# Patient Record
Sex: Male | Born: 1975 | Race: Black or African American | Hispanic: No | Marital: Single | Smoking: Current every day smoker
Health system: Southern US, Community
[De-identification: ages and names within clinical notes are randomized; demographics above are authoritative.]

## PROBLEM LIST (undated history)

## (undated) DIAGNOSIS — R011 Cardiac murmur, unspecified: Secondary | ICD-10-CM

## (undated) DIAGNOSIS — G4733 Obstructive sleep apnea (adult) (pediatric): Secondary | ICD-10-CM

## (undated) DIAGNOSIS — K5780 Diverticulitis of intestine, part unspecified, with perforation and abscess without bleeding: Secondary | ICD-10-CM

## (undated) DIAGNOSIS — M6282 Rhabdomyolysis: Secondary | ICD-10-CM

## (undated) DIAGNOSIS — Z9989 Dependence on other enabling machines and devices: Secondary | ICD-10-CM

## (undated) DIAGNOSIS — I10 Essential (primary) hypertension: Secondary | ICD-10-CM

## (undated) HISTORY — DX: Diverticulitis of intestine, part unspecified, with perforation and abscess without bleeding: K57.80

## (undated) HISTORY — DX: Dependence on other enabling machines and devices: Z99.89

## (undated) HISTORY — DX: Obstructive sleep apnea (adult) (pediatric): G47.33

---

## 1999-02-16 ENCOUNTER — Emergency Department (HOSPITAL_COMMUNITY): Admission: EM | Admit: 1999-02-16 | Discharge: 1999-02-16 | Payer: Self-pay | Admitting: Emergency Medicine

## 1999-04-13 ENCOUNTER — Emergency Department (HOSPITAL_COMMUNITY): Admission: EM | Admit: 1999-04-13 | Discharge: 1999-04-13 | Payer: Self-pay | Admitting: Emergency Medicine

## 1999-05-19 ENCOUNTER — Emergency Department (HOSPITAL_COMMUNITY): Admission: EM | Admit: 1999-05-19 | Discharge: 1999-05-19 | Payer: Self-pay | Admitting: Emergency Medicine

## 1999-09-22 ENCOUNTER — Emergency Department (HOSPITAL_COMMUNITY): Admission: EM | Admit: 1999-09-22 | Discharge: 1999-09-22 | Payer: Self-pay | Admitting: Emergency Medicine

## 1999-11-17 ENCOUNTER — Emergency Department (HOSPITAL_COMMUNITY): Admission: EM | Admit: 1999-11-17 | Discharge: 1999-11-17 | Payer: Self-pay | Admitting: Emergency Medicine

## 2001-12-22 ENCOUNTER — Emergency Department (HOSPITAL_COMMUNITY): Admission: EM | Admit: 2001-12-22 | Discharge: 2001-12-22 | Payer: Self-pay | Admitting: Emergency Medicine

## 2001-12-23 ENCOUNTER — Emergency Department (HOSPITAL_COMMUNITY): Admission: EM | Admit: 2001-12-23 | Discharge: 2001-12-23 | Payer: Self-pay | Admitting: Emergency Medicine

## 2003-11-19 ENCOUNTER — Emergency Department (HOSPITAL_COMMUNITY): Admission: AD | Admit: 2003-11-19 | Discharge: 2003-11-19 | Payer: Self-pay | Admitting: Family Medicine

## 2007-09-29 ENCOUNTER — Emergency Department (HOSPITAL_COMMUNITY): Admission: EM | Admit: 2007-09-29 | Discharge: 2007-09-29 | Payer: Self-pay | Admitting: Emergency Medicine

## 2007-11-06 ENCOUNTER — Ambulatory Visit: Payer: Self-pay | Admitting: Family Medicine

## 2007-11-06 ENCOUNTER — Inpatient Hospital Stay (HOSPITAL_COMMUNITY): Admission: EM | Admit: 2007-11-06 | Discharge: 2007-11-11 | Payer: Self-pay | Admitting: Emergency Medicine

## 2007-11-12 ENCOUNTER — Encounter: Payer: Self-pay | Admitting: Family Medicine

## 2007-11-15 ENCOUNTER — Ambulatory Visit (HOSPITAL_COMMUNITY): Admission: RE | Admit: 2007-11-15 | Discharge: 2007-11-15 | Payer: Self-pay | Admitting: Family Medicine

## 2007-11-18 ENCOUNTER — Ambulatory Visit: Payer: Self-pay | Admitting: Family Medicine

## 2007-11-18 DIAGNOSIS — R011 Cardiac murmur, unspecified: Secondary | ICD-10-CM | POA: Insufficient documentation

## 2007-11-18 DIAGNOSIS — K5730 Diverticulosis of large intestine without perforation or abscess without bleeding: Secondary | ICD-10-CM | POA: Insufficient documentation

## 2008-09-12 IMAGING — CT CT ABDOMEN W/ CM
2 of 5 series · 16 of 46 positions shown, 18 images · IV contrast (READICAT & 100 ML OMNI 300)
Comparison: 11/09/07 and 11/06/07.

CLINICAL DATA: 31 year old; diverticulitis with diverticular abscess.  Follow-up abscess drainage.
 ABDOMEN CT WITH CONTRAST ? 11/15/07:
TECHNIQUE: Multidetector CT imaging of the abdomen was performed following the standard protocol during bolus administration of intravenous contrast. 
 Contrast:  100 cc Omnipaque 300 IV.
TECHNIQUE: Multidetector CT imaging of the pelvis was performed following the standard protocol during bolus administration of intravenous contrast.

[Series 2: routine abdomen · axial · 0.81mm/px · z∈[-422,-27]mm · 13 of 89 slices shown, 15 images]
[im 5/89  soft-tissue]
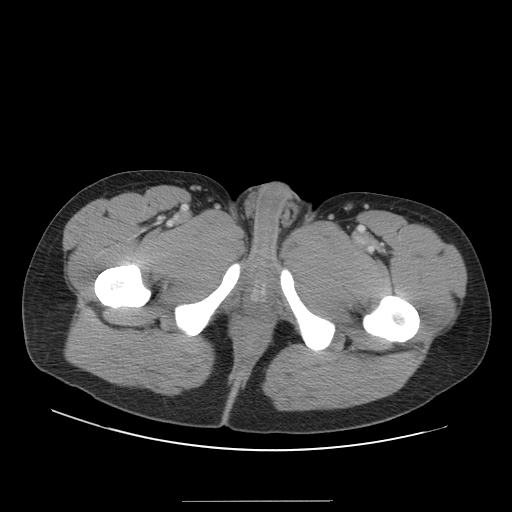
[im 5/89  bone]
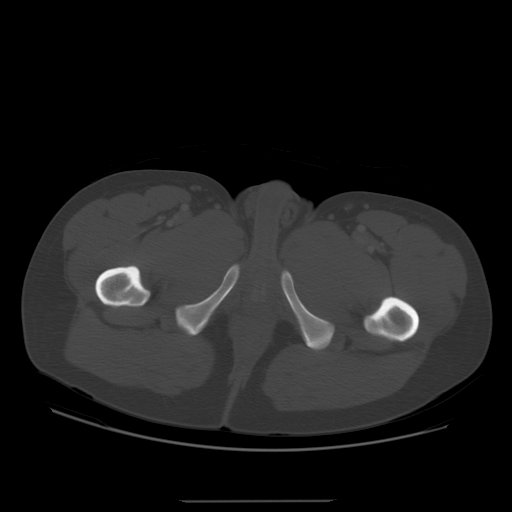
[im 14/89  soft-tissue]
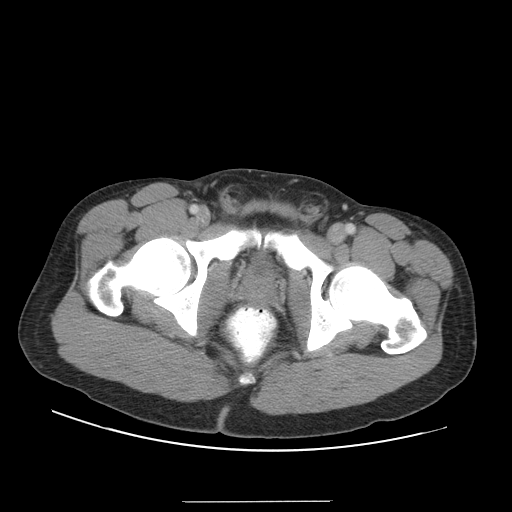
[im 19/89  soft-tissue]
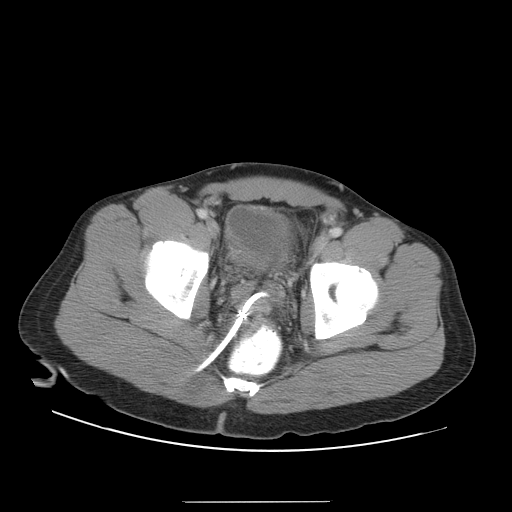
[im 24/89  soft-tissue]
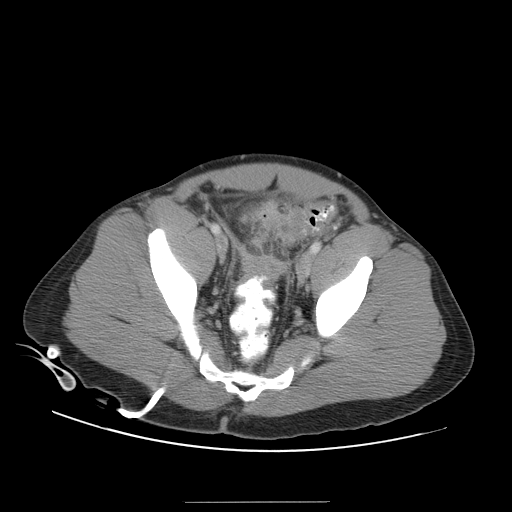
[im 33/89  soft-tissue]
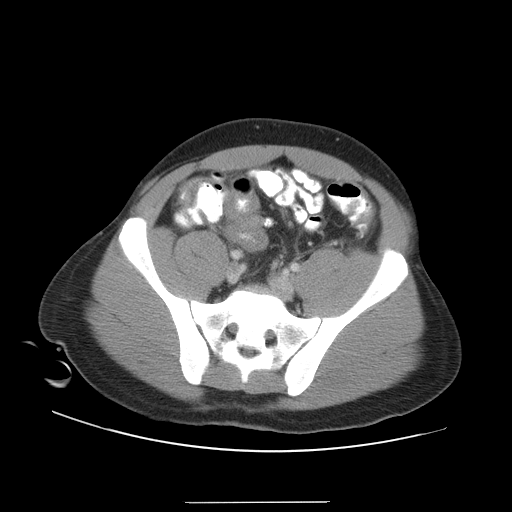
[im 38/89  soft-tissue]
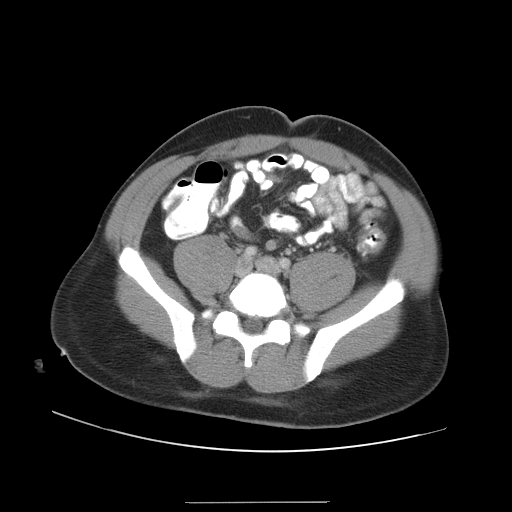
[im 47/89  soft-tissue]
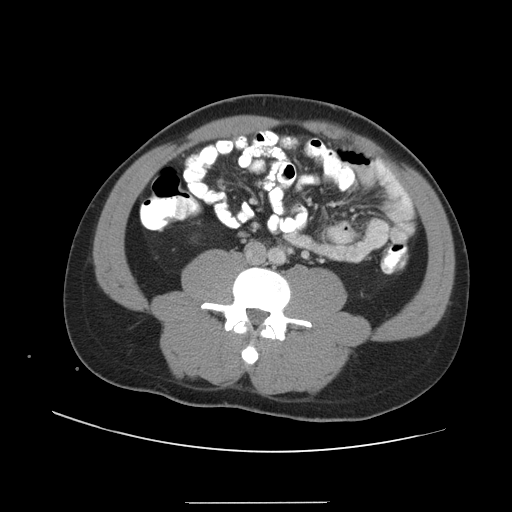
[im 51/89  soft-tissue]
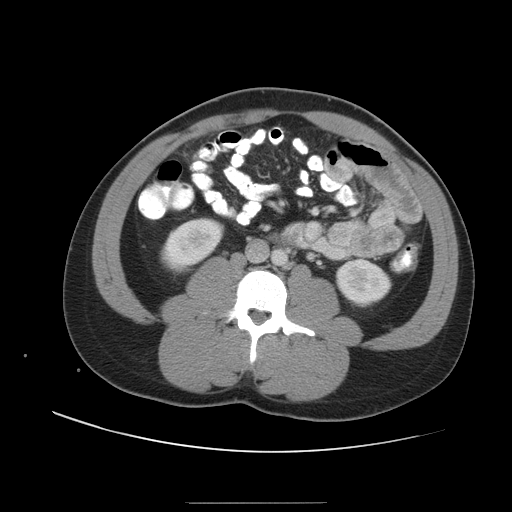
[im 56/89  soft-tissue]
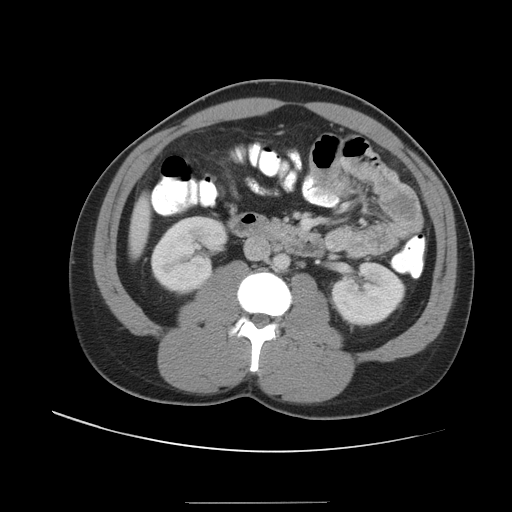
[im 56/89  bone]
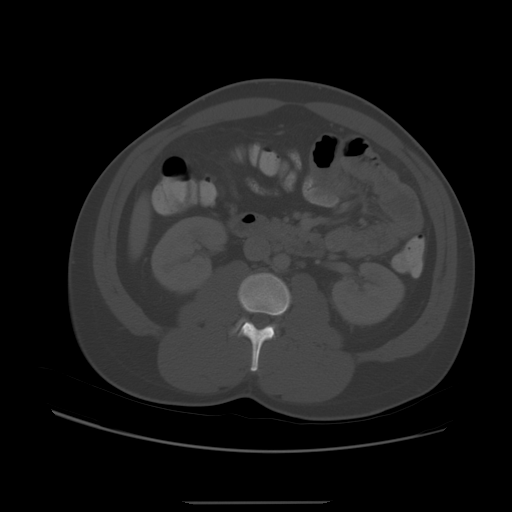
[im 65/89  soft-tissue]
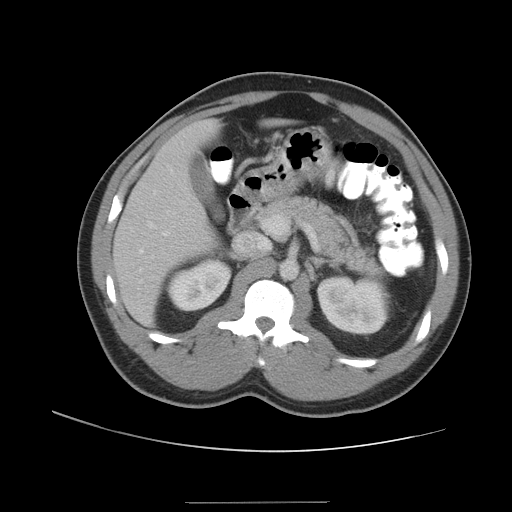
[im 70/89  soft-tissue]
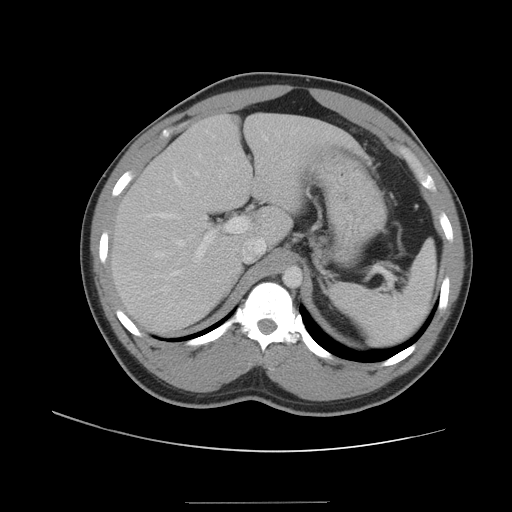
[im 75/89  soft-tissue]
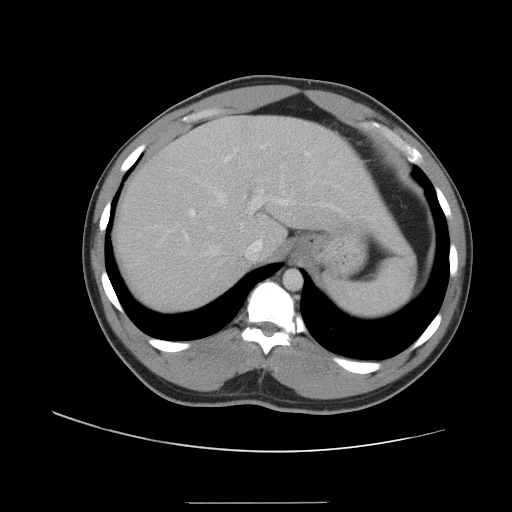
[im 84/89  soft-tissue]
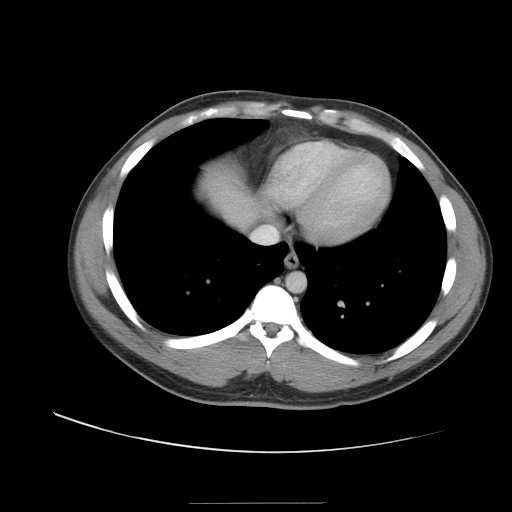

[Series 401: reformatted · coronal · 0.90mm/px · 3 of 166 slices shown]
[im 56/166  soft-tissue]
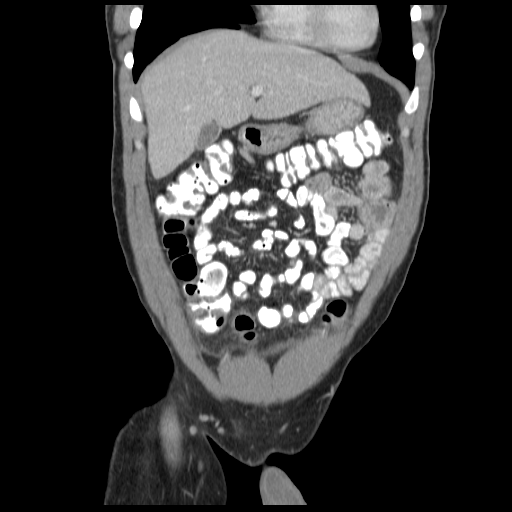
[im 74/166  soft-tissue]
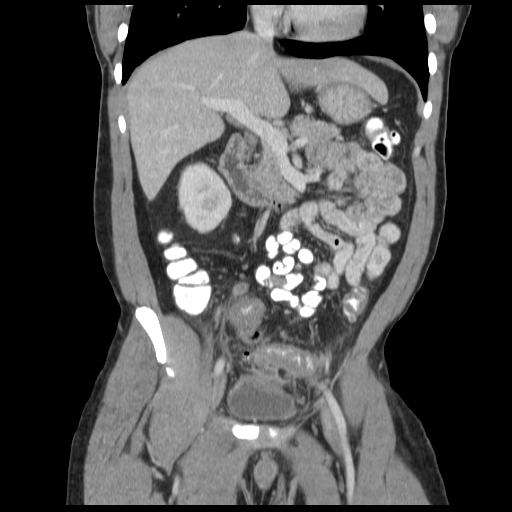
[im 92/166  soft-tissue]
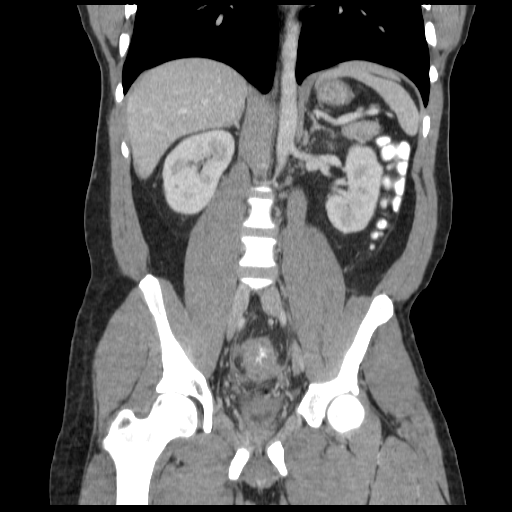

[16 of 46 positions shown; findings below may reference images not displayed]

FINDINGS: The lung bases are clear.  The liver, spleen, pancreas, adrenal glands, and kidneys are unremarkable and stable.  The gallbladder appears normal.  The stomach is not well distended with contrast but no gross abnormalities are seen. The duodenum, small bowel, and colon demonstrate no significant findings.
 There is no free air or free abdominal fluid collections.  Small scattered retroperitoneal lymph nodes are again demonstrated.  There are scattered colonic diverticula.
IMPRESSION: 1.  Unremarkable and stable CT appearance of the abdomen.  
 2.  Small retroperitoneal lymph nodes are stable to slightly smaller in size.
 PELVIS CT WITH CONTRAST ? 11/15/07:
FINDINGS: There is a drainage catheter in the low pelvis between the bladder and rectum where there was a previous abscess.  I don?t see any residual abscess or significant fluid.  The seminal vesicles and prostate gland appear normal. The bladder is unremarkable.  The rectum is unremarkable.  There is persistent diffuse wall thickening involving the sigmoid colon with diverticular disease and mild surrounding inflammatory change.  However, this is much improved compared to the prior examinations.
IMPRESSION: 1.  Drainage catheter in good position with no residual abscess cavity.
 2.  Persistent inflammatory changes involving the sigmoid colon but this is overall improved.
 3.  Small stable inguinal lymph nodes.

## 2009-09-04 ENCOUNTER — Encounter (INDEPENDENT_AMBULATORY_CARE_PROVIDER_SITE_OTHER): Payer: Self-pay | Admitting: *Deleted

## 2009-09-04 DIAGNOSIS — F17200 Nicotine dependence, unspecified, uncomplicated: Secondary | ICD-10-CM | POA: Insufficient documentation

## 2009-11-28 ENCOUNTER — Emergency Department (HOSPITAL_COMMUNITY): Admission: EM | Admit: 2009-11-28 | Discharge: 2009-11-28 | Payer: Self-pay | Admitting: Emergency Medicine

## 2010-06-09 ENCOUNTER — Emergency Department (HOSPITAL_COMMUNITY): Admission: EM | Admit: 2010-06-09 | Discharge: 2010-06-09 | Payer: Self-pay | Admitting: Emergency Medicine

## 2010-06-17 ENCOUNTER — Emergency Department (HOSPITAL_COMMUNITY): Admission: EM | Admit: 2010-06-17 | Discharge: 2010-06-17 | Payer: Self-pay | Admitting: Emergency Medicine

## 2010-06-17 HISTORY — PX: ACHILLES TENDON REPAIR: SUR1153

## 2010-06-18 ENCOUNTER — Ambulatory Visit (HOSPITAL_COMMUNITY): Admission: EM | Admit: 2010-06-18 | Discharge: 2010-06-18 | Payer: Self-pay | Admitting: Emergency Medicine

## 2010-09-16 ENCOUNTER — Ambulatory Visit: Payer: Self-pay | Admitting: Family Medicine

## 2010-09-16 ENCOUNTER — Inpatient Hospital Stay (HOSPITAL_COMMUNITY): Admission: EM | Admit: 2010-09-16 | Discharge: 2010-09-20 | Payer: Self-pay | Admitting: Emergency Medicine

## 2010-12-31 ENCOUNTER — Emergency Department (HOSPITAL_COMMUNITY)
Admission: EM | Admit: 2010-12-31 | Discharge: 2010-12-31 | Payer: Self-pay | Source: Home / Self Care | Admitting: Emergency Medicine

## 2011-03-02 LAB — CBC
HCT: 38.4 % — ABNORMAL LOW (ref 39.0–52.0)
HCT: 39.6 % (ref 39.0–52.0)
HCT: 42.2 % (ref 39.0–52.0)
HCT: 43 % (ref 39.0–52.0)
Hemoglobin: 13.5 g/dL (ref 13.0–17.0)
Hemoglobin: 15.4 g/dL (ref 13.0–17.0)
MCH: 33.6 pg (ref 26.0–34.0)
MCH: 34.4 pg — ABNORMAL HIGH (ref 26.0–34.0)
MCH: 34.5 pg — ABNORMAL HIGH (ref 26.0–34.0)
MCHC: 34.6 g/dL (ref 30.0–36.0)
MCHC: 35.1 g/dL (ref 30.0–36.0)
MCHC: 35.3 g/dL (ref 30.0–36.0)
MCHC: 35.8 g/dL (ref 30.0–36.0)
MCV: 96.2 fL (ref 78.0–100.0)
MCV: 96.6 fL (ref 78.0–100.0)
MCV: 98 fL (ref 78.0–100.0)
Platelets: 217 10*3/uL (ref 150–400)
Platelets: 245 10*3/uL (ref 150–400)
Platelets: 266 10*3/uL (ref 150–400)
Platelets: 291 10*3/uL (ref 150–400)
RBC: 3.96 MIL/uL — ABNORMAL LOW (ref 4.22–5.81)
RDW: 11.7 % (ref 11.5–15.5)
WBC: 10.2 10*3/uL (ref 4.0–10.5)
WBC: 6.1 10*3/uL (ref 4.0–10.5)

## 2011-03-02 LAB — BASIC METABOLIC PANEL
CO2: 28 mEq/L (ref 19–32)
CO2: 28 mEq/L (ref 19–32)
CO2: 29 mEq/L (ref 19–32)
Calcium: 8.7 mg/dL (ref 8.4–10.5)
Calcium: 8.8 mg/dL (ref 8.4–10.5)
Chloride: 100 mEq/L (ref 96–112)
Creatinine, Ser: 1.3 mg/dL (ref 0.4–1.5)
Creatinine, Ser: 1.31 mg/dL (ref 0.4–1.5)
GFR calc Af Amer: >60 mL/min (ref >60)
GFR calc non Af Amer: >60 mL/min (ref >60)
Glucose, Bld: 104 mg/dL — ABNORMAL HIGH (ref 70–99)
Potassium: 3.3 mEq/L — ABNORMAL LOW (ref 3.5–5.1)
Potassium: 3.3 mEq/L — ABNORMAL LOW (ref 3.5–5.1)
Potassium: 3.9 mEq/L (ref 3.5–5.1)
Sodium: 136 mEq/L (ref 135–145)
Sodium: 138 mEq/L (ref 135–145)
Sodium: 140 mEq/L (ref 135–145)

## 2011-03-02 LAB — URINALYSIS, ROUTINE W REFLEX MICROSCOPIC
Glucose, UA: NEGATIVE mg/dL
Hgb urine dipstick: NEGATIVE
Ketones, ur: 40 mg/dL — AB
Protein, ur: 30 mg/dL — AB
Urobilinogen, UA: 4 mg/dL — ABNORMAL HIGH (ref 0.0–1.0)

## 2011-03-02 LAB — DIFFERENTIAL
Basophils Relative: 0 % (ref 0–1)
Eosinophils Absolute: 0.3 10*3/uL (ref 0.0–0.7)
Lymphs Abs: 1.4 10*3/uL (ref 0.7–4.0)
Neutro Abs: 14.5 10*3/uL — ABNORMAL HIGH (ref 1.7–7.7)
Neutrophils Relative %: 81 % — ABNORMAL HIGH (ref 43–77)

## 2011-03-02 LAB — URINE CULTURE: Culture  Setup Time: 201110010045

## 2011-03-02 LAB — COMPREHENSIVE METABOLIC PANEL
ALT: 25 U/L (ref 0–53)
Alkaline Phosphatase: 98 U/L (ref 39–117)
BUN: 13 mg/dL (ref 6–23)
CO2: 24 mEq/L (ref 19–32)
Calcium: 9.4 mg/dL (ref 8.4–10.5)
GFR calc non Af Amer: >60 mL/min (ref >60)
Glucose, Bld: 88 mg/dL (ref 70–99)
Sodium: 137 mEq/L (ref 135–145)
Total Protein: 7.7 g/dL (ref 6.0–8.3)

## 2011-03-02 LAB — LIPASE, BLOOD
Lipase: 21 U/L (ref 11–59)
Lipase: 57 U/L (ref 11–59)

## 2011-03-02 LAB — URINE MICROSCOPIC-ADD ON

## 2011-03-05 LAB — COMPREHENSIVE METABOLIC PANEL
ALT: 41 U/L (ref 0–53)
AST: 35 U/L (ref 0–37)
Albumin: 4.1 g/dL (ref 3.5–5.2)
Alkaline Phosphatase: 80 U/L (ref 39–117)
Chloride: 108 mEq/L (ref 96–112)
Potassium: 4.1 mEq/L (ref 3.5–5.1)
Sodium: 139 mEq/L (ref 135–145)
Total Protein: 7.4 g/dL (ref 6.0–8.3)

## 2011-03-05 LAB — DIFFERENTIAL
Basophils Relative: 1 % (ref 0–1)
Eosinophils Absolute: 0.2 10*3/uL (ref 0.0–0.7)
Eosinophils Relative: 3 % (ref 0–5)
Monocytes Absolute: 0.5 10*3/uL (ref 0.1–1.0)
Monocytes Relative: 9 % (ref 3–12)

## 2011-03-05 LAB — CBC
Platelets: 195 10*3/uL (ref 150–400)
RBC: 4.39 MIL/uL (ref 4.22–5.81)
RDW: 12.8 % (ref 11.5–15.5)
WBC: 6.2 10*3/uL (ref 4.0–10.5)

## 2011-05-02 NOTE — Discharge Summary (Signed)
NAME:  Shane Guzman, Shane Guzman NO.:  [id number]   MEDICAL RECORD NO.:  [id number]          PATIENT TYPE:  OUT   LOCATION:  CATS                         FACILITY:  [facility]   PHYSICIAN:  Lauro Franklin, MDDATE OF BIRTH:  [birth year]   DATE OF ADMISSION:  DATE OF DISCHARGE:  11/11/2007                               DISCHARGE SUMMARY   ATTENDING PHYSICIAN:  Dr. Denny Levy   PRIMARY CARE Nastasha Reising:  The patient is to establish care at [facility]  family practice.   CONSULTATIONS:  Dr. Violeta Gelinas of general surgery.   PROCEDURES PERFORMED:  A CT-guided drain placement of pelvic abscess  collection.   REASON FOR ADMISSION:  Abdominal pain.   DISCHARGE DIAGNOSIS:  Diverticulitis with abscess formation.   MEDICATIONS ON DISCHARGE:  1. Percocet 5/325 mg 1-2 tablets p.o. every 4 hours as needed for      pain.  2. Ibuprofen over-the-counter 2-3 tablets p.o. every 8 hours as needed      for pain.  3. Ciprofloxacin 500 mg one tablet p.o. twice per day x10 days.  4. Flagyl 500 mg one tablet p.o. 3 times per day x10 days.   HOSPITAL COURSE:  The patient is a 35 year old otherwise healthy male  who presented with a 2-day history of abdominal pain.   1. Diverticulitis:  Upon admission and evaluation with CT scan of the      abdomen and pelvis, the patient was found to have a contained      perforated sigmoid diverticulitis with air imposed between the      inferior sigmoid and the dome of the urinary bladder, but no free      air.  Given the patient's initial CT scan findings and after      discussion with surgery, medical management was initiated.  The      patient was initially started on Cipro and Flagyl in transition to      Zosyn.  He was placed on bowel rest and received IV fluids.  He      also received pain medications to control his pain.  On November 09, 2007, the patient had a repeat CT scan which showed two areas      of discrete abscess formation, one just  below the dome of the      bladder, and the other in the pelvic cul-de-sac.  Therefore, on      November 10, 2007, the patient underwent CT-guided drain placement      of the pelvic abscess collection.  He had a transgluteal pelvic      drainage catheter placed.  The second abscess was not targeted.      The patient is to have a repeat CT scan on Friday, November 15, 2007, with both IV and p.o. contrast.  These report results should      be sent to Dr. Janee Morn of surgery.  The patient is also to follow      up with Dr. Janee Morn of surgery in one week.   The patient's condition  at the time of discharge:  Stable.  The patient  was complaining of a little pain and tolerating p.o. well.   1. Macrocytic anemia.   LABS ON DISCHARGE:  Abscess culture grew out streptococcus group C.  Sodium 141, potassium 3.4, glucose 99, BUN 5, creatinine 1.13, calcium  8.6.  White blood cell count 11.1, hemoglobin 12.3, MCV 102.1, platelets  260,000.   DISPOSITION:  The patient is discharged to home.   FOLLOWUP:  1. The patient is to follow up with Dr. Janee Morn of surgery in one      week.  The patient is to call for an appointment.  2. The patient is to follow up with Dr. Altamese Cabal of [facility]      family practice on Monday, November 18, 2007, at 9:45 a.m.  3. The patient is to go to [facility] radiology on Friday, November 15, 2007 at 1:00 p.m., for a CT scan of the abdomen and pelvis with      and without IV contrast.  The patient has been instructed that he      cannot eat anything four hours prior to procedure, which would be      9:00 a.m., and that he must not drink any of the contrast at 11:00      The patient was also instructed that he must pick up the contrast      and all other materials from radiology himself.   FOLLOWUP ISSUES:  1. Surgery recommends that the patient follow up with gastroenterology      as an outpatient for a colonoscopy in 4-6 weeks after his       diverticulitis resolves.  2. Macrocytic anemia.  3. Progression or resolution of second abdominal abscess.      Lauro Franklin, MD  Electronically Signed     TCB/MEDQ  D:  11/12/2007  T:  11/13/2007  Job:  [id number]

## 2011-05-02 NOTE — Consult Note (Signed)
NAME:  GRIFF, BADLEY NO.:  [id number]   MEDICAL RECORD NO.:  [id number]          PATIENT TYPE:  INP   LOCATION:  6727                         FACILITY:  [facility]   PHYSICIAN:  Gabrielle Dare. Janee Morn, M.D.DATE OF BIRTH:  10/22/1976   DATE OF CONSULTATION:  11/07/2007  DATE OF DISCHARGE:                                 CONSULTATION   CONSULTING PHYSICIAN:  Gabrielle Dare. Janee Morn, M.D.   PRIMARY CARE PHYSICIAN:  Unassigned.  He has been admitted by the family  practice teaching service.   REASON FOR CONSULTATION:  Diverticulitis with microperforation,  contained.   HISTORY OF PRESENT ILLNESS:  Mr. Shane Guzman is a 35 year old otherwise  healthy male patient who does have regular tobacco and alcohol usage.  He was admitted through [facility] ER by the family practice teaching service  after presenting with mid abdominal pain and nausea, vomiting for at  least 2 days.  He was found to have a leukocytosis.  There was some  concern he may have had appendicitis initially due to having some right  lower quadrant abdominal pain.  CT scan was done that actually  demonstrated a distal sigmoid diverticulitis with microperforation which  was contained, surrounding mesenteric edema without any true fluid or  abscess collections.  The patient reports that since admission his pain  has decreased, though he does have some abdominal pain with passage of  flatus.  He carries no confirmed diagnosis of diverticular disease  before admission. He does have a family history of this with his  grandmother.  The patient does report similar but milder symptoms x2  separate occurrences greater than 1 year ago.  He describes this as  being cramp like. Symptoms resolved independently of medical treatment  and, therefore, he did not go find a physician.   REVIEW OF SYSTEMS:  As above.   SOCIAL HISTORY:  Significant for tobacco or alcohol use.  He is  unemployed.   PAST MEDICAL HISTORY:  None.   PAST SURGICAL  HISTORY:  None.   ALLERGIES:  No known drug allergies.   CURRENT MEDICATIONS HERE AT [facility]:  Include Dilaudid IV, Zofran IV,  Protonix IV, Tylenol p.r.n., Zosyn IV,  He also received Cipro and  Flagyl in the ER.   PHYSICAL EXAMINATION:  GENERAL:  Pleasant male patient with multiple  questions, complaining of new abdominal pain which has improved since  admission.  VITAL SIGNS:  Temperature 99.8.  blood pressure 130/82, pulse 92 and  regular, respirations 18.  NEUROLOGIC:  The patient is alert, oriented x3, moving all extremities  x4 without focal deficits.  HEENT:  Head is normocephalic.  Sclerae are slightly icteric but not  injected.  NECK:  supple without any appreciable adenopathy.  CHEST:  Bilateral lung sounds clear to auscultation. Respiratory effort  is not labored.  He is on room air.  CARDIAC:  S1, S2.  No rubs, murmurs, thrills, or gallops.  Pulses  regular.  No JVD.  ABDOMEN:  Bloated but soft.  Bowel sounds are quite  active.  He is mildly diffusely tender without any focal areas. No  fullness  felt.  There is no guarding, no rebounding.  EXTREMITIES:  Symmetrical in appearance without edema, cyanosis or  clubbing.   LABORATORY DATA:  White count today is 15,700.  This is up slightly from  yesterday 13,700, hemoglobin 14.9, platelets 144,000, MCV 101.5.  Sodium  129, potassium 3.8, CO2 27, glucose 120, BUN 13, creatinine 1.35.   Diagnostic CT of the abdomen and pelvis as noted.   IMPRESSION:  Sigmoid diverticulitis with microperforation, contained,  first episode requiring hospitalization.   PLAN:  1. Continue Zosyn.  2. Bowel rest.  3. IV fluids.  4. Add Toradol to maximize pain management.  Continue IV Dilaudid.  5. Followup labs:  Will probably need repeat CT prior to discharge to      clarify if the mesenteric edema and if the microperforation has      devolved into a full collection or abscess.  6. Begin diet when the patient has decreased pain and white  cell count      normalizes.  7. teaching regarding low-residue diet.  Anticipate patient will need      to go home on a low-residue diet for at least 2 weeks after      discharge. After that, he will need to begin a diverticular-      appropriate diet.  I have asked for teaching regarding this as      well.  8. At this time, no surgery is indicated unless during this      hospitalization the patient's condition worsens.  No elective      surgery unless the patient has a second episode of microperforation      requiring hospitalization.  The patient has had two similar      episodes of abdominal pain but has not been confirmed as      diverticular disease.  He will discuss this with Dr. Janee Morn to      see if this warrants proceeding with elective surgery in 4-6 weeks.  9. Recommend patient follow up with gastroenterology as an outpatient      for colonoscopy in 4-6 weeks after current process resolves.      Allison L. Rennis Harding, N.P.      Gabrielle Dare Janee Morn, M.D.  Electronically Signed    ALE/MEDQ  D:  11/07/2007  T:  11/07/2007  Job:  [id number]

## 2011-05-02 NOTE — H&P (Signed)
NAME:  Shane Guzman, Shane Guzman NO.:  [id number]   MEDICAL RECORD NO.:  [id number]          PATIENT TYPE:  INP   LOCATION:  6727                         FACILITY:  [facility]   PHYSICIAN:  Shane Guzman, M.D.    DATE OF BIRTH:  1976-05-28   DATE OF ADMISSION:  11/06/2007  DATE OF DISCHARGE:                              HISTORY & PHYSICAL   REASON FOR ADMISSION:  Abdominal pain.   HISTORY OF PRESENT ILLNESS:  Shane Guzman is a 35 year old, African  American male with no significant past medical history who presented to  the ED with history of abdominal pain x2 days.  The patient states the  pain has been constant, cramp-like in his entire lower abdomen.  The  pain persisted and worsened last night and it was so severe he was  unable to sleep.  He has nausea and has had an episode of emesis that  was nonbloody and nonbilious.  The patient has not tried any  medications.  Movement exacerbates the pain as his coughing.  The  patient has taken very low p.o. intake in the last 2 days secondary to  discomfort and nausea.  He has no other complaints.  The patient had a  similar episode of pain 1 year ago, but did not seek medical attention.  In the ED, there was concern for appendicitis.  A CT of abdomen and  pelvis revealed a perforated sigmoid diverticulitis.  The patient  initially started on Cipro and metronidazole.  In the ED, he spiked a  temperature to 102.  The ER doctor spoke with general surgery who  recommended IV antibiotics and close observation.   PAST MEDICAL HISTORY:  None.   SURGICAL HISTORY:  None.   MEDICATIONS:  None.   ALLERGIES:  No known drug allergies.   FAMILY HISTORY:  He has a grandmother with diabetes and diverticular  disease.  His mother had a stroke.  He has an uncle with some sort of  stomach problem that needed a colostomy.   SOCIAL HISTORY:  Currently unemployed.  He smokes one to one and half  packs per day x15 years.  He drinks about six beers  a week.  He uses  marijuana occasionally.  Denies other illicit drug use.   PHYSICAL EXAMINATION:  VITAL SIGNS:  Temperature initially 97.8, then  102, respirations 18, pulse 102, blood pressure 116/85, O2 saturations  96% on room air.  GENERAL:  The patient is lying in bed with abdominal discomfort.  CARDIOVASCULAR:  Regular rate and rhythm, a 2/6 systolic ejection  murmur.  HEENT:  Head normocephalic, atraumatic.  Pupils equal and reactive to  light and accommodation.  Extraocular movements intact.  No scleral  icterus.  Dry mucous membranes.  LUNGS:  Clear to auscultation bilaterally with normal work of breathing.  ABDOMEN:  Positive bowel sounds, very tender in the lower quadrant.  Positive rebound.  EXTREMITIES:  No clubbing, cyanosis or edema.  NEUROLOGIC:  Cranial nerves 2-12 are grossly intact.   LABORATORY DATA AND X-RAY FINDINGS:  His white count is 13.8, hemoglobin  17.8, hematocrit 52.0,  platelets 155.  UA showed moderate bilirubin, 15  ketones, 100 protein, positive nitrite, trace leukocyte esterase,  hyaline casts, many bacteria.  Sodium 136, potassium 3.9, chloride 97,  bicarb 30, BUN 13, creatinine 1.52, platelets 154.  The remainder of the  CMP normal.   CT of the abdomen and pelvis.  He has a contained perforated sigmoid  diverticulitis with air imposed between the inferior sigmoid and the  dome of the urinary bladder.  No free air.   ASSESSMENT/PLAN:  This is 35 year old, African American male with  abdominal pain x2 days and CT findings consistent with a perforated  diverticulitis.   1. Diverticulitis.  Given the patient's age and presentation, there      was initial concern for appendicitis.  However the pelvic CT      revealed a perforated sigmoid diverticulitis.  Emergency department      physician spoke with surgery who stated medical management would be      appropriate at this point.  The patient was initially started on      Cipro and Flagyl.  We will  change the patient to Zosyn q.6 h. for      least 7 days depending on how he does.  Will continue to monitor      his white blood cell count.  We will give him acetaminophen for      fever.  Will place his diet as ice chips.  Will give him Phenergan      for nausea.  We will give him Dilaudid for pain.  We will have      surgery see the patient in the morning for input.  If his abdominal      abscess worsens or does not respond to intravenous antibiotics, he      may require surgery.  We will obtain blood cultures x2 as well.  2. Acute renal failure.  His creatinine was 1.52 on admission.  This      is likely due to dehydration and poor oral intake.  The patient has      already received a 1 L bolus in the emergency department.  We will      run one-half normal saline at 125 per hour.  3. Pyuria.  This is likely secondary to irritation from the      diverticulitis given the proximity on the dome of the bladder.      Will obtain a urine culture and continue to monitor for signs of      urinary dysfunction.  4. Tobacco abuse.  The patient declines patch.  We will obtain smoking      cessation consult.  5. Prophylaxis.  He will be on Protonix intravenous daily for      gastrointestinal prophylaxis and SCDs for deep venous thrombosis      prophylaxis.      Altamese Cabal, M.D.  Electronically Signed      Shane Guzman, M.D.  Electronically Signed    KS/MEDQ  D:  11/06/2007  T:  11/07/2007  Job:  [id number]

## 2011-06-28 ENCOUNTER — Emergency Department (HOSPITAL_COMMUNITY)
Admission: EM | Admit: 2011-06-28 | Discharge: 2011-06-28 | Disposition: A | Discharge status: Home / Self Care | Payer: Self-pay | Attending: Emergency Medicine | Admitting: Emergency Medicine

## 2011-06-28 DIAGNOSIS — J3489 Other specified disorders of nose and nasal sinuses: Secondary | ICD-10-CM | POA: Insufficient documentation

## 2011-06-28 DIAGNOSIS — L909 Atrophic disorder of skin, unspecified: Secondary | ICD-10-CM | POA: Insufficient documentation

## 2011-06-28 DIAGNOSIS — L919 Hypertrophic disorder of the skin, unspecified: Secondary | ICD-10-CM | POA: Insufficient documentation

## 2011-08-06 ENCOUNTER — Emergency Department (HOSPITAL_COMMUNITY): Payer: Self-pay

## 2011-08-06 ENCOUNTER — Emergency Department (HOSPITAL_COMMUNITY)
Admission: EM | Admit: 2011-08-06 | Discharge: 2011-08-06 | Disposition: A | Discharge status: Home / Self Care | Payer: Self-pay | Attending: Emergency Medicine | Admitting: Emergency Medicine

## 2011-08-06 DIAGNOSIS — M25519 Pain in unspecified shoulder: Secondary | ICD-10-CM | POA: Insufficient documentation

## 2011-08-06 DIAGNOSIS — S2239XA Fracture of one rib, unspecified side, initial encounter for closed fracture: Secondary | ICD-10-CM | POA: Insufficient documentation

## 2011-08-06 DIAGNOSIS — IMO0002 Reserved for concepts with insufficient information to code with codable children: Secondary | ICD-10-CM | POA: Insufficient documentation

## 2011-08-06 DIAGNOSIS — R079 Chest pain, unspecified: Secondary | ICD-10-CM | POA: Insufficient documentation

## 2011-09-26 LAB — CBC
HCT: 36.3 — ABNORMAL LOW
HCT: 37.3 — ABNORMAL LOW
Hemoglobin: 11.8 — ABNORMAL LOW
Hemoglobin: 12.8 — ABNORMAL LOW
Hemoglobin: 14.9
Hemoglobin: 17.8 — ABNORMAL HIGH
MCHC: 33.8
MCHC: 34.1
MCHC: 34.3
MCHC: 34.4
MCHC: 34.7
MCV: 100.4 — ABNORMAL HIGH
MCV: 101.5 — ABNORMAL HIGH
Platelets: 155
Platelets: 204
Platelets: 260
RBC: 3.39 — ABNORMAL LOW
RBC: 3.56 — ABNORMAL LOW
RBC: 3.65 — ABNORMAL LOW
RBC: 4.27
RDW: 12.4
RDW: 12.8
RDW: 12.8
WBC: 11.1 — ABNORMAL HIGH
WBC: 15.7 — ABNORMAL HIGH

## 2011-09-26 LAB — COMPREHENSIVE METABOLIC PANEL
ALT: 13
ALT: 34
AST: 15
Albumin: 2.2 — ABNORMAL LOW
Albumin: 3.9
Alkaline Phosphatase: 55
Calcium: 8.3 — ABNORMAL LOW
Calcium: 9.5
GFR calc Af Amer: >60
GFR calc Af Amer: >60
Glucose, Bld: 146 — ABNORMAL HIGH
Glucose, Bld: 154 — ABNORMAL HIGH
Potassium: 3.3 — ABNORMAL LOW
Sodium: 136
Sodium: 139
Total Protein: 5.5 — ABNORMAL LOW
Total Protein: 8.2

## 2011-09-26 LAB — URINE MICROSCOPIC-ADD ON

## 2011-09-26 LAB — BASIC METABOLIC PANEL
BUN: 5 — ABNORMAL LOW
BUN: 5 — ABNORMAL LOW
CO2: 27
CO2: 28
CO2: 29
Calcium: 8.3 — ABNORMAL LOW
Calcium: 8.5
Chloride: 95 — ABNORMAL LOW
Chloride: 99
Creatinine, Ser: 1.19
Creatinine, Ser: 1.21
Creatinine, Ser: 1.35
GFR calc Af Amer: >60
GFR calc Af Amer: >60
GFR calc non Af Amer: >60
GFR calc non Af Amer: >60
Glucose, Bld: 108 — ABNORMAL HIGH
Glucose, Bld: 98
Potassium: 3.4 — ABNORMAL LOW
Potassium: 3.8
Sodium: 134 — ABNORMAL LOW
Sodium: 134 — ABNORMAL LOW

## 2011-09-26 LAB — DIFFERENTIAL
Basophils Relative: 0
Eosinophils Absolute: 0.1 — ABNORMAL LOW
Eosinophils Relative: 1
Lymphocytes Relative: 6 — ABNORMAL LOW
Lymphs Abs: 0.9
Monocytes Absolute: 1
Monocytes Relative: 10
Monocytes Relative: 7
Neutro Abs: 12.1 — ABNORMAL HIGH
Neutrophils Relative %: 83 — ABNORMAL HIGH

## 2011-09-26 LAB — CULTURE, BLOOD (ROUTINE X 2)
Culture: NO GROWTH
Culture: NO GROWTH

## 2011-09-26 LAB — CULTURE, ROUTINE-ABSCESS

## 2011-09-26 LAB — URINALYSIS, ROUTINE W REFLEX MICROSCOPIC
Glucose, UA: NEGATIVE
Hgb urine dipstick: NEGATIVE
Specific Gravity, Urine: 1.037 — ABNORMAL HIGH

## 2011-09-26 LAB — URINE CULTURE

## 2011-09-26 LAB — HEMOGLOBIN A1C: Hgb A1c MFr Bld: 4.9

## 2011-11-22 ENCOUNTER — Inpatient Hospital Stay (HOSPITAL_COMMUNITY)
Admission: EM | Admit: 2011-11-22 | Discharge: 2011-11-24 | DRG: 392 | Disposition: A | Discharge status: Home / Self Care | Payer: Self-pay | Attending: General Surgery | Admitting: General Surgery

## 2011-11-22 ENCOUNTER — Encounter: Payer: Self-pay | Admitting: *Deleted

## 2011-11-22 ENCOUNTER — Emergency Department (HOSPITAL_COMMUNITY): Payer: Self-pay

## 2011-11-22 DIAGNOSIS — K5732 Diverticulitis of large intestine without perforation or abscess without bleeding: Principal | ICD-10-CM | POA: Diagnosis present

## 2011-11-22 DIAGNOSIS — F17200 Nicotine dependence, unspecified, uncomplicated: Secondary | ICD-10-CM | POA: Diagnosis present

## 2011-11-22 DIAGNOSIS — Z79899 Other long term (current) drug therapy: Secondary | ICD-10-CM

## 2011-11-22 DIAGNOSIS — K5720 Diverticulitis of large intestine with perforation and abscess without bleeding: Secondary | ICD-10-CM | POA: Diagnosis present

## 2011-11-22 HISTORY — DX: Cardiac murmur, unspecified: R01.1

## 2011-11-22 LAB — COMPREHENSIVE METABOLIC PANEL
BUN: 13 mg/dL (ref 6–23)
CO2: 24 mEq/L (ref 19–32)
Chloride: 103 mEq/L (ref 96–112)
Creatinine, Ser: 1.22 mg/dL (ref 0.50–1.35)
GFR calc Af Amer: 87 mL/min — ABNORMAL LOW (ref >90)
GFR calc non Af Amer: 75 mL/min — ABNORMAL LOW (ref >90)
Glucose, Bld: 116 mg/dL — ABNORMAL HIGH (ref 70–99)
Total Bilirubin: 0.7 mg/dL (ref 0.3–1.2)

## 2011-11-22 LAB — URINALYSIS, ROUTINE W REFLEX MICROSCOPIC
Hgb urine dipstick: NEGATIVE
Leukocytes, UA: NEGATIVE
Protein, ur: NEGATIVE mg/dL
Urobilinogen, UA: 1 mg/dL (ref 0.0–1.0)

## 2011-11-22 LAB — DIFFERENTIAL
Lymphocytes Relative: 19 % (ref 12–46)
Monocytes Absolute: 1.4 10*3/uL — ABNORMAL HIGH (ref 0.1–1.0)
Monocytes Relative: 11 % (ref 3–12)
Neutro Abs: 8.5 10*3/uL — ABNORMAL HIGH (ref 1.7–7.7)

## 2011-11-22 LAB — CBC
HCT: 48.3 % (ref 39.0–52.0)
Hemoglobin: 17 g/dL (ref 13.0–17.0)
MCHC: 35.2 g/dL (ref 30.0–36.0)
MCV: 98.2 fL (ref 78.0–100.0)
WBC: 12.3 10*3/uL — ABNORMAL HIGH (ref 4.0–10.5)

## 2011-11-22 MED ORDER — MORPHINE SULFATE 4 MG/ML IJ SOLN
6.0000 mg | Freq: Once | INTRAMUSCULAR | Status: AC
  Administered 2011-11-22: 4 mg via INTRAVENOUS
  Filled 2011-11-22: qty 1

## 2011-11-22 MED ORDER — IOHEXOL 300 MG/ML  SOLN
90.0000 mL | Freq: Once | INTRAMUSCULAR | Status: AC | PRN
  Administered 2011-11-22: 90 mL via INTRAVENOUS

## 2011-11-22 MED ORDER — ENOXAPARIN SODIUM 40 MG/0.4ML ~~LOC~~ SOLN
40.0000 mg | SUBCUTANEOUS | Status: DC
  Administered 2011-11-22 – 2011-11-23 (×2): 40 mg via SUBCUTANEOUS
  Filled 2011-11-22 (×3): qty 0.4

## 2011-11-22 MED ORDER — PANTOPRAZOLE SODIUM 40 MG IV SOLR
40.0000 mg | Freq: Every day | INTRAVENOUS | Status: DC
  Administered 2011-11-22 – 2011-11-23 (×2): 40 mg via INTRAVENOUS
  Filled 2011-11-22 (×3): qty 40

## 2011-11-22 MED ORDER — MORPHINE SULFATE 4 MG/ML IJ SOLN
6.0000 mg | Freq: Once | INTRAMUSCULAR | Status: AC
  Administered 2011-11-22: 6 mg via INTRAVENOUS
  Filled 2011-11-22: qty 2

## 2011-11-22 MED ORDER — ACETAMINOPHEN 325 MG PO TABS: 650.0000 mg | ORAL_TABLET | Freq: Four times a day (QID) | ORAL | Status: DC | PRN

## 2011-11-22 MED ORDER — MORPHINE SULFATE 2 MG/ML IJ SOLN
1.0000 mg | INTRAMUSCULAR | Status: DC | PRN
  Administered 2011-11-22 – 2011-11-23 (×3): 4 mg via INTRAVENOUS
  Administered 2011-11-23 (×2): 2 mg via INTRAVENOUS
  Administered 2011-11-23 – 2011-11-24 (×2): 4 mg via INTRAVENOUS
  Filled 2011-11-22: qty 2
  Filled 2011-11-22: qty 1
  Filled 2011-11-22 (×3): qty 2
  Filled 2011-11-22: qty 1
  Filled 2011-11-22: qty 2

## 2011-11-22 MED ORDER — PIPERACILLIN-TAZOBACTAM 3.375 G IVPB
3.3750 g | Freq: Three times a day (TID) | INTRAVENOUS | Status: DC
  Administered 2011-11-22 – 2011-11-23 (×2): 3.375 g via INTRAVENOUS
  Filled 2011-11-22 (×4): qty 50

## 2011-11-22 MED ORDER — SODIUM CHLORIDE 0.9 % IV SOLN: Freq: Once | INTRAVENOUS | Status: DC

## 2011-11-22 MED ORDER — MORPHINE SULFATE 10 MG/ML IJ SOLN
INTRAMUSCULAR | Status: AC
  Administered 2011-11-22: 10 mg
  Filled 2011-11-22: qty 1

## 2011-11-22 MED ORDER — ONDANSETRON HCL 4 MG/2ML IJ SOLN: 4.0000 mg | Freq: Four times a day (QID) | INTRAMUSCULAR | Status: DC | PRN

## 2011-11-22 MED ORDER — DIPHENHYDRAMINE HCL 12.5 MG/5ML PO ELIX
12.5000 mg | ORAL_SOLUTION | Freq: Four times a day (QID) | ORAL | Status: DC | PRN
  Filled 2011-11-22: qty 10

## 2011-11-22 MED ORDER — PIPERACILLIN-TAZOBACTAM 3.375 G IVPB
3.3750 g | Freq: Once | INTRAVENOUS | Status: AC
  Administered 2011-11-22: 3.375 g via INTRAVENOUS
  Filled 2011-11-22: qty 50

## 2011-11-22 MED ORDER — ONDANSETRON HCL 4 MG/2ML IJ SOLN
4.0000 mg | Freq: Once | INTRAMUSCULAR | Status: AC
  Administered 2011-11-22: 4 mg via INTRAVENOUS
  Filled 2011-11-22: qty 2

## 2011-11-22 MED ORDER — SODIUM CHLORIDE 0.9 % IV SOLN
1.0000 g | INTRAVENOUS | Status: DC
  Administered 2011-11-22 – 2011-11-23 (×2): 1 g via INTRAVENOUS
  Filled 2011-11-22 (×2): qty 1

## 2011-11-22 MED ORDER — ACETAMINOPHEN 650 MG RE SUPP: 650.0000 mg | Freq: Four times a day (QID) | RECTAL | Status: DC | PRN

## 2011-11-22 MED ORDER — MORPHINE SULFATE 4 MG/ML IJ SOLN
4.0000 mg | Freq: Once | INTRAMUSCULAR | Status: AC
  Administered 2011-11-22 (×2): 4 mg via INTRAVENOUS
  Filled 2011-11-22: qty 1

## 2011-11-22 MED ORDER — KCL IN DEXTROSE-NACL 20-5-0.45 MEQ/L-%-% IV SOLN
INTRAVENOUS | Status: DC
  Administered 2011-11-22 – 2011-11-24 (×5): via INTRAVENOUS
  Filled 2011-11-22 (×6): qty 1000

## 2011-11-22 MED ORDER — DIPHENHYDRAMINE HCL 50 MG/ML IJ SOLN: 12.5000 mg | Freq: Four times a day (QID) | INTRAMUSCULAR | Status: DC | PRN

## 2011-11-22 MED ORDER — MORPHINE SULFATE 4 MG/ML IJ SOLN
INTRAMUSCULAR | Status: AC
  Administered 2011-11-22: 4 mg via INTRAVENOUS
  Filled 2011-11-22: qty 1

## 2011-11-22 NOTE — ED Provider Notes (Signed)
Medical screening examination/treatment/procedure(s) were performed by non-physician practitioner and as supervising physician I was immediately available for consultation/collaboration.  Flint Melter, MD 11/22/11 [phone]

## 2011-11-22 NOTE — H&P (Signed)
Third episode of diverticulitis in a young man.  Will try to cool off with IV ABX now with plans for elective colectomy once acute inflammation subsides. Plan D/W patient. Patient examined and I agree with the assessment and plan  Shane Guzman E

## 2011-11-22 NOTE — Progress Notes (Signed)
ANTIBIOTIC CONSULT NOTE - INITIAL  Pharmacy Consult for Zosyn Indication: Empiric abdominal coverage  No Known Allergies  Patient Measurements:     Vital Signs: Temp: 98.3 F (36.8 C) (12/05 0953) Temp src: Oral (12/05 0953) BP: 144/96 mmHg (12/05 1151) Pulse Rate: 86  (12/05 1151) Intake/Output from previous day:   Intake/Output from this shift:    Labs:  [facility] 11/22/11 0903  WBC 12.3*  HGB 17.0  PLT 170  LABCREA --  CREATININE 1.22   CrCl is unknown because there is no height on file for the current visit. No results found for this basename: VANCOTROUGH:2,VANCOPEAK:2,VANCORANDOM:2,GENTTROUGH:2,GENTPEAK:2,GENTRANDOM:2,TOBRATROUGH:2,TOBRAPEAK:2,TOBRARND:2,AMIKACINPEAK:2,AMIKACINTROU:2,AMIKACIN:2, in the last 72 hours   Microbiology: No results found for this or any previous visit (from the past 720 hour(s)).  Medical History: Past Medical History  Diagnosis Date  . Diverticulitis     Medications:   (Not in a hospital admission)  Assessment: 35 y.o. M to start Zosyn for empiric abdominal coverage. SCr 1.2, normalized CrCl~80-90 ml/min.   Plan:  1. Zosyn 3.375g IV every 8 hours 2. Will continue to follow renal function, culture results, LOT, and antibiotic de-escalation plans   Rolley Sims 11/22/2011,12:38 PM

## 2011-11-22 NOTE — ED Provider Notes (Signed)
History     CSN: [id number] Arrival date & time: 11/22/2011  8:20 AM   First MD Initiated Contact with Patient 11/22/11 [phone]      Chief Complaint  Patient presents with  . Abdominal Pain    (Consider location/radiation/quality/duration/timing/severity/associated sxs/prior treatment) Patient is a 35 y.o. male presenting with abdominal pain. The history is provided by the patient.  Abdominal Pain The primary symptoms of the illness include abdominal pain. The primary symptoms of the illness do not include nausea, vomiting, diarrhea or dysuria. The current episode started more than 2 days ago. The onset of the illness was gradual. The problem has been gradually worsening.  The patient has not had a change in bowel habit. Symptoms associated with the illness do not include chills, anorexia, constipation, urgency, hematuria or back pain. Significant associated medical issues include diverticulitis.  Pt states painstarted about a week ago, but was mild, in the left lower abdomen. States pain gradually worsened, and became severe last night. States similar pain in the past, was diagnosed with diverticular abscess and had to have it drained. Deneis fever, nausea, vomiting, urinary symptoms, change in bowel habbits.  Past Medical History  Diagnosis Date  . Diverticulitis     History reviewed. No pertinent past surgical history.  History reviewed. No pertinent family history.  History  Substance Use Topics  . Smoking status: Current Everyday Smoker -- 0.5 packs/day    Types: Cigarettes  . Smokeless tobacco: Not on file  . Alcohol Use: 2.4 oz/week    4 Cans of beer per week      Review of Systems  Constitutional: Negative for chills.  HENT: Negative.   Eyes: Negative.   Respiratory: Negative.   Cardiovascular: Negative.   Gastrointestinal: Positive for abdominal pain. Negative for nausea, vomiting, diarrhea, constipation, blood in stool, anal bleeding and anorexia.  Genitourinary:  Negative for dysuria, urgency and hematuria.  Musculoskeletal: Negative.  Negative for back pain.  Skin: Negative.   Neurological: Negative.   Psychiatric/Behavioral: Negative.     Allergies  Review of patient's allergies indicates no known allergies.  Home Medications   Current Outpatient Rx  Name Route Sig Dispense Refill  . IBUPROFEN 200 MG PO TABS Oral Take 600 mg by mouth every 6 (six) hours as needed. For pain       BP 157/100  Pulse 98  Temp(Src) 98.3 F (36.8 C) (Oral)  Resp 18  SpO2 96%  Physical Exam  Constitutional: He is oriented to person, place, and time. He appears well-developed and well-nourished. No distress.  HENT:  Head: Normocephalic and atraumatic.  Eyes: Pupils are equal, round, and reactive to light.  Neck: Neck supple.  Cardiovascular: Normal rate, regular rhythm and normal heart sounds.   Pulmonary/Chest: Effort normal and breath sounds normal. No respiratory distress.  Abdominal: Soft. Bowel sounds are normal.       Tenderness in RLQ and LLQ, guarding  Musculoskeletal: Normal range of motion.  Neurological: He is alert and oriented to person, place, and time.  Skin: Skin is warm and dry. No erythema.  Psychiatric: He has a normal mood and affect.    ED Course  Procedures (including critical care time)  Labs Reviewed  CBC - Abnormal; Notable for the following:    WBC 12.3 (*)    MCH 34.6 (*)    All other components within normal limits  DIFFERENTIAL - Abnormal; Notable for the following:    Neutro Abs 8.5 (*)    Monocytes Absolute 1.4 (*)  All other components within normal limits  COMPREHENSIVE METABOLIC PANEL  URINALYSIS, ROUTINE W REFLEX MICROSCOPIC   PT with hx of diverticulitis, pain to the LLQ and RLQ on exam, guarding. Pt does have a history of an abscess. Will get CT scan for further evaluation  1:15 PM CT scan positive for a small perforation of colon with acute diverticular disease. Zosyn started. VS stable, he is  hypertensive, but afebrile. Spoke with surgery, will admit/come see in ed  MDM          Lottie Mussel, PA 11/22/11 1316

## 2011-11-22 NOTE — H&P (Signed)
Chief Complaint: Abdominal Pain HPI: Shane Guzman is an 35 y.o. male with hx of prior deverticulitis. Once in 2008 requiring perc drainage of associated abscess, and again in September of 2011 with microperforation, requiring hospitalization but no procedure. He has not had any outpt f/u. He began having some mild LLQ abd pain about a week ago and it progressed last night. He has some associated chills and sweats. He tried to go to work this morning but due to the pain, he had to leave and come to the ER. He reports eating a little bit last night and took a laxative. He's had two BMs, no melena or hematochezia, but no improvement of his pain. His workup in the ER finds elevated WBC and recurrent sigmoid diverticulitis on CT. Surgery consult requested.  Past Medical History:  Past Medical History  Diagnosis Date  . Diverticulitis     Past Surgical History: (L) Achilles tendon rupture surgery 7/11  Family History: History reviewed. No pertinent family history.  Social History:  reports that he has been smoking Cigarettes.  He has been smoking about .5 packs per day. He does not have any smokeless tobacco history on file. He drinks a beer or two 3-4 times a week. No illicit drug use.  Allergies: No Known Allergies  Medications:No regular home medications  Complete 10 system review negative except pertinent positives in HPI  Blood pressure 151/84, pulse 83, temperature 98 F (36.7 C), temperature source Oral, resp. rate 16, SpO2 99.00%. There is no height or weight on file to calculate BMI.  General Appearance:  Alert, cooperative, no distress, nontoxic, appears stated age  Head:  Normocephalic, without obvious abnormality, atraumatic  ENT: Unremarkable  Neck: Supple, symmetrical, trachea midline, no adenopathy, thyroid: not enlarged, symmetric, no tenderness/mass/nodules  Lungs:   Clear to auscultation bilaterally, no w/r/r, respirations unlabored without use of accessory muscles.  Chest  Wall:  No tenderness or deformity  Heart:  Regular rate and rhythm, S1, S2 normal, no murmur, rub or gallop. Carotids 2+ without bruit.  Abdomen:   Mild distention. Tender LLQ with mild guarding, no rebound. Few BS, no masses.  Genitalia:  Normal. No hernias  Rectal:  Deferred.  Extremities: Extremities normal, atraumatic, no cyanosis or edema  Pulses: 2+ and symmetric  Skin: Skin color, texture, turgor normal, no rashes or lesions  Neurologic: Normal affect, no gross deficits.   Results for orders placed during the hospital encounter of 11/22/11 (from the past 48 hour(s))  CBC     Status: Abnormal   Collection Time   11/22/11  9:03 AM      Component Value Range Comment   WBC 12.3 (*) 4.0 - 10.5 (K/uL)    RBC 4.92  4.22 - 5.81 (MIL/uL)    Hemoglobin 17.0  13.0 - 17.0 (g/dL)    HCT 40.9  81.1 - 91.4 (%)    MCV 98.2  78.0 - 100.0 (fL)    MCH 34.6 (*) 26.0 - 34.0 (pg)    MCHC 35.2  30.0 - 36.0 (g/dL)    RDW 78.2  95.6 - 21.3 (%)    Platelets 170  150 - 400 (K/uL)   DIFFERENTIAL     Status: Abnormal   Collection Time   11/22/11  9:03 AM      Component Value Range Comment   Neutrophils Relative 69  43 - 77 (%)    Neutro Abs 8.5 (*) 1.7 - 7.7 (K/uL)    Lymphocytes Relative 19  12 -  46 (%)    Lymphs Abs 2.3  0.7 - 4.0 (K/uL)    Monocytes Relative 11  3 - 12 (%)    Monocytes Absolute 1.4 (*) 0.1 - 1.0 (K/uL)    Eosinophils Relative 1  0 - 5 (%)    Eosinophils Absolute 0.1  0.0 - 0.7 (K/uL)    Basophils Relative 0  0 - 1 (%)    Basophils Absolute 0.0  0.0 - 0.1 (K/uL)   COMPREHENSIVE METABOLIC PANEL     Status: Abnormal   Collection Time   11/22/11  9:03 AM      Component Value Range Comment   Sodium 135  135 - 145 (mEq/L)    Potassium 4.0  3.5 - 5.1 (mEq/L)    Chloride 103  96 - 112 (mEq/L)    CO2 24  19 - 32 (mEq/L)    Glucose, Bld 116 (*) 70 - 99 (mg/dL)    BUN 13  6 - 23 (mg/dL)    Creatinine, Ser 5.78  0.50 - 1.35 (mg/dL)    Calcium 9.2  8.4 - 10.5 (mg/dL)    Total Protein  7.9  6.0 - 8.3 (g/dL)    Albumin 4.0  3.5 - 5.2 (g/dL)    AST 24  0 - 37 (U/L)    ALT 28  0 - 53 (U/L)    Alkaline Phosphatase 93  39 - 117 (U/L)    Total Bilirubin 0.7  0.3 - 1.2 (mg/dL)    GFR calc non Af Amer 75 (*) >90 (mL/min)    GFR calc Af Amer 87 (*) >90 (mL/min)   URINALYSIS, ROUTINE W REFLEX MICROSCOPIC     Status: Normal   Collection Time   11/22/11 11:04 AM      Component Value Range Comment   Color, Urine YELLOW  YELLOW     APPearance CLEAR  CLEAR     Specific Gravity, Urine 1.024  1.005 - 1.030     pH 5.5  5.0 - 8.0     Glucose, UA NEGATIVE  NEGATIVE (mg/dL)    Hgb urine dipstick NEGATIVE  NEGATIVE     Bilirubin Urine NEGATIVE  NEGATIVE     Ketones, ur NEGATIVE  NEGATIVE (mg/dL)    Protein, ur NEGATIVE  NEGATIVE (mg/dL)    Urobilinogen, UA 1.0  0.0 - 1.0 (mg/dL)    Nitrite NEGATIVE  NEGATIVE     Leukocytes, UA NEGATIVE  NEGATIVE  MICROSCOPIC NOT DONE ON URINES WITH NEGATIVE PROTEIN, BLOOD, LEUKOCYTES, NITRITE, OR GLUCOSE <1000 mg/dL.   Ct Abdomen Pelvis W Contrast  11/22/2011  *RADIOLOGY REPORT*  Clinical Data: Abdominal pain history of diverticulitis complicated by abscess  CT ABDOMEN AND PELVIS WITH CONTRAST  Technique:  Multidetector CT imaging of the abdomen and pelvis was performed following the standard protocol during bolus administration of intravenous contrast.  Contrast: 90mL OMNIPAQUE IOHEXOL 300 MG/ML IV SOLN  Comparison: 09/16/2010  Findings: Minimal scattered bibasilar dependent atelectasis. Normal heart size.  No pericardial or pleural effusion.  No hiatal hernia.  Abdomen:  Focal fatty infiltration of the liver along the falciform ligament, left hepatic lobe, image 15 through 18.  Stable appearance.  No other hepatic abnormality or biliary dilatation. Gallbladder, biliary system, pancreas, spleen, adrenal glands, and kidneys are within normal limits for age and demonstrate no acute process.  Under distended stomach, small bowel, and central mesentery demonstrate  no acute process.  Negative for obstruction.  Diffuse colonic diverticulosis noted even involving the right colon.  Pelvis:  Sigmoid wall thickening noted with pericolonic inflammatory stranding / edema.  Diverticular changes present in this area.  Appearance is consistent with acute diverticulitis which is recurrent.  This involves a more proximal segment of the sigmoid colon when compared to 09/16/2010. Small extraluminal air collections suspected along the diverticulitis consistent with a contained diverticular perforation.  No drainable fluid collection or abscess.  Trace pelvic free fluid, image 79.  Urinary bladder unremarkable. No adenopathy, hemorrhage, hematoma, or inguinal abnormality.  No hernia.  Prominent appendix containing stool and eight air but no definite acute appendicitis.  No acute or abnormal osseous finding.  IMPRESSION: Proximal acute sigmoid diverticulitis with an adjacent small contained diverticular perforation.  No drainable fluid collection or an abscess.  Critical Value/emergent results were called by telephone at the time of interpretation on 11/22/2011  at 12:00 p.m.  to  Dr. Lynelle Doctor, who verbally acknowledged these results.  Original Report Authenticated By: Judie Petit. Ruel Favors, M.D.    Assessment: Principal Problem:  *Diverticulitis of colon with perforation No abscess Plan: Admit for IV abx. NPO, bowel rest until pain resolved/better. Discussed possibilities if treatment failure including surgery this admit, which may result in colostomy creation. Hopefully anticipate improvement with conservative treatment but explained importance of f/u for elective resection, as this is his 3rd admission for diverticulitis. He understands and will comply.  Marianna Fuss 11/22/2011, 1:44 PM

## 2011-11-22 NOTE — ED Notes (Signed)
Reports having LLQ pain x 1 week, hx of diverticulitis, denies n/v/d.

## 2011-11-22 NOTE — ED Notes (Signed)
Pt c/o LLQ abd pain x5 days, hx of Diverticulitis, last episode 4 mons ago, denies N/V/D or blood in stool, pain w/urination and BM, last normal BM was Monday am, reports taking laxative approx 2030 last pm w/2 BM's last night and one this am, reports eating chicken noodle soup approx 1900 last pm. Pt describes the pain as constant cramping, 10/10

## 2011-11-23 LAB — CBC
Hemoglobin: 15.3 g/dL (ref 13.0–17.0)
MCH: 34.2 pg — ABNORMAL HIGH (ref 26.0–34.0)
Platelets: 151 10*3/uL (ref 150–400)
RBC: 4.47 MIL/uL (ref 4.22–5.81)
WBC: 11.2 10*3/uL — ABNORMAL HIGH (ref 4.0–10.5)

## 2011-11-23 LAB — BASIC METABOLIC PANEL
CO2: 27 mEq/L (ref 19–32)
Chloride: 98 mEq/L (ref 96–112)
Glucose, Bld: 122 mg/dL — ABNORMAL HIGH (ref 70–99)
Sodium: 132 mEq/L — ABNORMAL LOW (ref 135–145)

## 2011-11-23 MED ORDER — BIOTENE DRY MOUTH MT LIQD
15.0000 mL | Freq: Two times a day (BID) | OROMUCOSAL | Status: DC
  Administered 2011-11-23: 15 mL via OROMUCOSAL

## 2011-11-23 MED ORDER — CHLORHEXIDINE GLUCONATE 0.12 % MT SOLN
15.0000 mL | Freq: Two times a day (BID) | OROMUCOSAL | Status: DC
  Administered 2011-11-23 (×2): 15 mL via OROMUCOSAL
  Filled 2011-11-23 (×2): qty 15

## 2011-11-23 NOTE — Progress Notes (Signed)
Subjective: Feels much better this am.  States pain 5/10 today, was 10/10 yesterday.  No BM overnight.  Denies nausea or vomiting.  Anxious to get out of hospital by tomorrow   Objective: Vital signs in last 24 hours: Temp:  [98 F (36.7 C)-102 F (38.9 C)] 98.6 F (37 C) (12/06 0622) Pulse Rate:  [76-112] 83  (12/06 0622) Resp:  [13-26] 16  (12/06 0622) BP: (123-179)/(76-109) 123/76 mmHg (12/06 0622) SpO2:  [93 %-100 %] 95 % (12/06 0622) Weight:  [180 lb 6.4 oz (81.829 kg)] 180 lb 6.4 oz (81.829 kg) (12/05 1815) Last BM Date: 11/22/11  Intake/Output this shift:    Physical Exam: BP 123/76  Pulse 83  Temp(Src) 98.6 F (37 C) (Axillary)  Resp 16  Ht 5\' 4"  (1.626 m)  Wt 180 lb 6.4 oz (81.829 kg)  BMI 30.97 kg/m2  SpO2 95% Gen:  In bed, NAD Abd: Tender in LLQ.  Mild distention.  BS hypoactive  Labs: CBC  Basename 11/23/11 0715 11/22/11 0903  WBC 11.2* 12.3*  HGB 15.3 17.0  HCT 44.5 48.3  PLT 151 170   BMET  Basename 11/23/11 0715 11/22/11 0903  NA 132* 135  K 3.7 4.0  CL 98 103  CO2 27 24  GLUCOSE 122* 116*  BUN 8 13  CREATININE 1.26 1.22  CALCIUM 8.4 9.2   LFT  Basename 11/22/11 0903  PROT 7.9  ALBUMIN 4.0  AST 24  ALT 28  ALKPHOS 93  BILITOT 0.7  BILIDIR --  IBILI --  LIPASE --    Studies/Results: Ct Abdomen Pelvis W Contrast  11/22/2011  *RADIOLOGY REPORT*  Clinical Data: Abdominal pain history of diverticulitis complicated by abscess  CT ABDOMEN AND PELVIS WITH CONTRAST  Technique:  Multidetector CT imaging of the abdomen and pelvis was performed following the standard protocol during bolus administration of intravenous contrast.  Contrast: 90mL OMNIPAQUE IOHEXOL 300 MG/ML IV SOLN  Comparison: 09/16/2010  Findings: Minimal scattered bibasilar dependent atelectasis. Normal heart size.  No pericardial or pleural effusion.  No hiatal hernia.  Abdomen:  Focal fatty infiltration of the liver along the falciform ligament, left hepatic lobe, image  15 through 18.  Stable appearance.  No other hepatic abnormality or biliary dilatation. Gallbladder, biliary system, pancreas, spleen, adrenal glands, and kidneys are within normal limits for age and demonstrate no acute process.  Under distended stomach, small bowel, and central mesentery demonstrate no acute process.  Negative for obstruction.  Diffuse colonic diverticulosis noted even involving the right colon.  Pelvis:  Sigmoid wall thickening noted with pericolonic inflammatory stranding / edema.  Diverticular changes present in this area.  Appearance is consistent with acute diverticulitis which is recurrent.  This involves a more proximal segment of the sigmoid colon when compared to 09/16/2010. Small extraluminal air collections suspected along the diverticulitis consistent with a contained diverticular perforation.  No drainable fluid collection or abscess.  Trace pelvic free fluid, image 79.  Urinary bladder unremarkable. No adenopathy, hemorrhage, hematoma, or inguinal abnormality.  No hernia.  Prominent appendix containing stool and eight air but no definite acute appendicitis.  No acute or abnormal osseous finding.  IMPRESSION: Proximal acute sigmoid diverticulitis with an adjacent small contained diverticular perforation.  No drainable fluid collection or an abscess.  Critical Value/emergent results were called by telephone at the time of interpretation on 11/22/2011  at 12:00 p.m.  to  Dr. Lynelle Doctor, who verbally acknowledged these results.  Original Report Authenticated By: Judie Petit. TREVOR SHICK, M.D.  Assessment: Principal Problem:  *Diverticulitis of colon with perforation     Plan: Continue bowel rest, will reassess this afternoon, if pain minimal will consider starting clears.  Continue invanz Morphine for pain control Cont IVF at current rate Will need elective colectomy once acute episode subsides for recurrent episodes of diverticulitis.  LOS: 1 day      Shane Guzman 11/23/2011

## 2011-11-23 NOTE — Progress Notes (Signed)
Patient examined and I agree with the assessment and plan  Shane Guzman  

## 2011-11-24 LAB — CBC
HCT: 43.7 % (ref 39.0–52.0)
Hemoglobin: 14.9 g/dL (ref 13.0–17.0)
MCH: 33.9 pg (ref 26.0–34.0)
MCV: 99.3 fL (ref 78.0–100.0)
RBC: 4.4 MIL/uL (ref 4.22–5.81)
WBC: 8.3 10*3/uL (ref 4.0–10.5)

## 2011-11-24 MED ORDER — METRONIDAZOLE 500 MG PO TABS: 500.0000 mg | ORAL_TABLET | Freq: Three times a day (TID) | ORAL | Status: DC

## 2011-11-24 MED ORDER — HYDROCODONE-ACETAMINOPHEN 5-325 MG PO TABS: 1.0000 | ORAL_TABLET | Freq: Four times a day (QID) | ORAL | Status: AC | PRN

## 2011-11-24 MED ORDER — PANTOPRAZOLE SODIUM 40 MG PO TBEC: 40.0000 mg | DELAYED_RELEASE_TABLET | Freq: Every day | ORAL | Status: DC

## 2011-11-24 MED ORDER — CIPROFLOXACIN HCL 500 MG PO TABS: 500.0000 mg | ORAL_TABLET | Freq: Two times a day (BID) | ORAL | Status: DC

## 2011-11-24 MED ORDER — HYDROCODONE-ACETAMINOPHEN 5-325 MG PO TABS
1.0000 | ORAL_TABLET | Freq: Four times a day (QID) | ORAL | Status: DC | PRN
  Administered 2011-11-24: 2 via ORAL
  Filled 2011-11-24: qty 2

## 2011-11-24 MED ORDER — METRONIDAZOLE 500 MG PO TABS
500.0000 mg | ORAL_TABLET | Freq: Three times a day (TID) | ORAL | Status: DC
  Administered 2011-11-24: 500 mg via ORAL
  Filled 2011-11-24 (×6): qty 1

## 2011-11-24 MED ORDER — CIPROFLOXACIN HCL 500 MG PO TABS
500.0000 mg | ORAL_TABLET | Freq: Two times a day (BID) | ORAL | Status: DC
  Administered 2011-11-24: 500 mg via ORAL
  Filled 2011-11-24 (×5): qty 1

## 2011-11-24 MED ORDER — SODIUM CHLORIDE 0.9 % IV SOLN: INTRAVENOUS | Status: DC

## 2011-11-24 NOTE — Discharge Summary (Signed)
Physician Discharge Summary  Patient ID: Shane Guzman MRN: [id number] DOB/AGE: 1976/02/17 35 y.o.  Admit date: 11/22/2011 Discharge date: 11/24/2011  Admission Diagnoses: Diverticulitis Discharge Diagnoses:  Principal Problem:  *Diverticulitis of colon with perforation    Discharged Condition: good  Hospital Course: Pt admitted with #rd bout of diverticulitis since 2008. He had to have perc drain for an abscess once before. This time, his initial CT showed Sigmoid diverticulitis with small microperf, no abscess. He was admitted on 12/5 and started on IV abx. His pain resolved fairly quickly and he was started on po diet after about 24hrs. He tolerated advanced diet and was switched to oral abx. He has tolerated these well and we feel he is stable for DC home.  Consults: none   Discharge Exam: Blood pressure 136/82, pulse 86, temperature 98.9 F (37.2 C), temperature source Oral, resp. rate 16, height 5\' 4"  (1.626 m), weight 180 lb 6.4 oz (81.829 kg), SpO2 96.00%. GI: soft, non-tender; bowel sounds normal; no masses,  no organomegaly  Disposition: Home or Self Care  Discharge Orders    Future Orders Please Complete By Expires   Diet - low sodium heart healthy      Increase activity slowly      Other Restrictions      Comments:   May return to work as of 11/27/11   Call MD for:  severe uncontrolled pain      Call MD for:  temperature >100.4      Call MD for:  persistant nausea and vomiting        Current Discharge Medication List    START taking these medications   Details  HYDROcodone-acetaminophen (NORCO) 5-325 MG per tablet Take 1-2 tablets by mouth every 6 (six) hours as needed for pain. Qty: 30 tablet, Refills: 0      CONTINUE these medications which have CHANGED   Details  ciprofloxacin (CIPRO) 500 MG tablet Take 1 tablet (500 mg total) by mouth 2 (two) times daily. Take 1 tablet by mouth two times a day for 10 days Qty: 20 tablet, Refills: 0    metroNIDAZOLE  (FLAGYL) 500 MG tablet Take 1 tablet (500 mg total) by mouth 3 (three) times daily. For 10 days Qty: 30 tablet, Refills: 0      CONTINUE these medications which have NOT CHANGED   Details  ibuprofen (ADVIL,MOTRIN) 200 MG tablet Take 600 mg by mouth every 6 (six) hours as needed. For pain       STOP taking these medications     oxyCODONE-acetaminophen (PERCOCET) 5-325 MG per tablet        Follow-up Information    Follow up with [facility], MD. Make an appointment in 2 weeks.   Contact information:   [facility] [address] [place] [address] [phone]          Signed: Marianna Fuss 11/24/2011, 11:59 AM   '

## 2011-11-24 NOTE — Progress Notes (Signed)
Discharge instructions/Med Rec Sheet reviewed w/ pt. Pt expressed understanding and copies given w/ prescriptions. Pt ambulated off unit in stable condition per his request. 

## 2011-11-24 NOTE — Progress Notes (Signed)
Agree Glori Machnik E  

## 2011-11-24 NOTE — Progress Notes (Signed)
Subjective: Feels fine this am.  States pain 2-3/10 at worst today.  Tolerating clears.  Having regular BM. Denies nausea or vomiting.  Anxious to get out of hospital today   Objective: Vital signs in last 24 hours: Temp:  [98.4 F (36.9 C)-98.9 F (37.2 C)] 98.9 F (37.2 C) (12/07 0609) Pulse Rate:  [86-93] 86  (12/07 0609) Resp:  [16-20] 16  (12/07 0609) BP: (133-142)/(79-87) 136/82 mmHg (12/07 0609) SpO2:  [96 %-100 %] 96 % (12/07 0609) Last BM Date: 11/22/11  Intake/Output this shift:    Physical Exam: BP 136/82  Pulse 86  Temp(Src) 98.9 F (37.2 C) (Oral)  Resp 16  Ht 5\' 4"  (1.626 m)  Wt 180 lb 6.4 oz (81.829 kg)  BMI 30.97 kg/m2  SpO2 96% Gen:  In bed, NAD Abd: Soft, NT/ND.  BS +  Labs: CBC  Basename 11/24/11 0516 11/23/11 0715  WBC 8.3 11.2*  HGB 14.9 15.3  HCT 43.7 44.5  PLT 166 151   BMET  Basename 11/23/11 0715 11/22/11 0903  NA 132* 135  K 3.7 4.0  CL 98 103  CO2 27 24  GLUCOSE 122* 116*  BUN 8 13  CREATININE 1.26 1.22  CALCIUM 8.4 9.2   LFT  Basename 11/22/11 0903  PROT 7.9  ALBUMIN 4.0  AST 24  ALT 28  ALKPHOS 93  BILITOT 0.7  BILIDIR --  IBILI --  LIPASE --    Studies/Results: Ct Abdomen Pelvis W Contrast  11/22/2011  *RADIOLOGY REPORT*  Clinical Data: Abdominal pain history of diverticulitis complicated by abscess  CT ABDOMEN AND PELVIS WITH CONTRAST  Technique:  Multidetector CT imaging of the abdomen and pelvis was performed following the standard protocol during bolus administration of intravenous contrast.  Contrast: 90mL OMNIPAQUE IOHEXOL 300 MG/ML IV SOLN  Comparison: 09/16/2010  Findings: Minimal scattered bibasilar dependent atelectasis. Normal heart size.  No pericardial or pleural effusion.  No hiatal hernia.  Abdomen:  Focal fatty infiltration of the liver along the falciform ligament, left hepatic lobe, image 15 through 18.  Stable appearance.  No other hepatic abnormality or biliary dilatation. Gallbladder, biliary  system, pancreas, spleen, adrenal glands, and kidneys are within normal limits for age and demonstrate no acute process.  Under distended stomach, small bowel, and central mesentery demonstrate no acute process.  Negative for obstruction.  Diffuse colonic diverticulosis noted even involving the right colon.  Pelvis:  Sigmoid wall thickening noted with pericolonic inflammatory stranding / edema.  Diverticular changes present in this area.  Appearance is consistent with acute diverticulitis which is recurrent.  This involves a more proximal segment of the sigmoid colon when compared to 09/16/2010. Small extraluminal air collections suspected along the diverticulitis consistent with a contained diverticular perforation.  No drainable fluid collection or abscess.  Trace pelvic free fluid, image 79.  Urinary bladder unremarkable. No adenopathy, hemorrhage, hematoma, or inguinal abnormality.  No hernia.  Prominent appendix containing stool and eight air but no definite acute appendicitis.  No acute or abnormal osseous finding.  IMPRESSION: Proximal acute sigmoid diverticulitis with an adjacent small contained diverticular perforation.  No drainable fluid collection or an abscess.  Critical Value/emergent results were called by telephone at the time of interpretation on 11/22/2011  at 12:00 p.m.  to  Dr. Lynelle Doctor, who verbally acknowledged these results.  Original Report Authenticated By: Judie Petit. Ruel Favors, M.D.    Assessment: Principal Problem:  *Diverticulitis of colon with perforation     Plan: Change antibiotics to cipro and flagyl  po Hydrocodone for pain control KVO fluids Advance diet, patient anxious to be d/c this afternoon, will see how he does with advancement of diet Will need elective colectomy once acute episode subsides for recurrent episodes of diverticulitis.  LOS: 2 days     Shane Guzman 11/24/2011

## 2011-11-24 NOTE — Progress Notes (Signed)
   CARE MANAGEMENT NOTE 11/24/2011  Patient:  YOON, BARCA   Account Number:  [id number]  Date Initiated:  11/24/2011  Documentation initiated by:  Carlyle Lipa  Subjective/Objective Assessment:   Diverticulitis     Action/Plan:   Home 12/7 with no HH needs   Anticipated DC Date:  11/24/2011   Anticipated DC Plan:  HOME/SELF CARE      DC Planning Services  CM consult                Status of service:  Completed, signed off Medicare Important Message given?  NA - LOS <3 / Initial given by admissions  Discharge Disposition:  HOME/SELF CARE  Per UR Regulation:  Reviewed for med. necessity/level of care/duration of stay

## 2011-11-24 NOTE — Discharge Summary (Signed)
Agree Shane Guzman E  

## 2011-12-05 ENCOUNTER — Encounter (INDEPENDENT_AMBULATORY_CARE_PROVIDER_SITE_OTHER): Payer: Self-pay | Admitting: General Surgery

## 2011-12-06 ENCOUNTER — Ambulatory Visit (INDEPENDENT_AMBULATORY_CARE_PROVIDER_SITE_OTHER): Payer: Self-pay | Admitting: General Surgery

## 2011-12-06 ENCOUNTER — Encounter (INDEPENDENT_AMBULATORY_CARE_PROVIDER_SITE_OTHER): Payer: Self-pay | Admitting: General Surgery

## 2011-12-06 VITALS — BP 138/96 | HR 104 | Temp 97.4°F | Resp 12 | Ht 65.0 in | Wt 184.2 lb

## 2011-12-06 DIAGNOSIS — K5732 Diverticulitis of large intestine without perforation or abscess without bleeding: Secondary | ICD-10-CM

## 2011-12-06 NOTE — Progress Notes (Signed)
Subjective:     Patient ID: Shane Guzman, male   DOB: 1976/06/04, 35 y.o.   MRN: [id number]  HPI Shane Guzman presents for followup from hospitalization for sigmoid diverticulitis.  this was treated medically. He is completing his oral antibiotic course over the next couple days. His abdominal pain is almost completely resolved. He is moving his bowels regularly. He has had no blood in his stools. All in all he is feeling much better.  Review of Systems     Objective:   Physical Exam  Constitutional: He appears well-developed and well-nourished.  Cardiovascular: Normal rate, regular rhythm and normal heart sounds.   Pulmonary/Chest: Effort normal and breath sounds normal. No respiratory distress. He has no wheezes. He has no rales.  Abdominal: Soft. He exhibits no distension. There is tenderness. There is no rebound and no guarding.       Minimal tenderness left lower quadrant on deep palpation       Assessment:     Resolving sigmoid diverticulitis    Plan:     Complete oral antibiotics. This is the patient's third episode. We discussed the possibility of elective sigmoid colectomy again. This will need to wait until all of the acute inflammation is gone. I will see him back next month.I also encouraged the patient to establish a primary care physician.

## 2012-02-14 ENCOUNTER — Encounter (INDEPENDENT_AMBULATORY_CARE_PROVIDER_SITE_OTHER): Payer: Self-pay | Admitting: General Surgery

## 2012-04-15 ENCOUNTER — Emergency Department (HOSPITAL_COMMUNITY): Payer: Self-pay

## 2012-04-15 ENCOUNTER — Encounter (HOSPITAL_COMMUNITY): Payer: Self-pay | Admitting: *Deleted

## 2012-04-15 ENCOUNTER — Emergency Department (HOSPITAL_COMMUNITY)
Admission: EM | Admit: 2012-04-15 | Discharge: 2012-04-15 | Disposition: A | Discharge status: Home / Self Care | Payer: Self-pay | Attending: Emergency Medicine | Admitting: Emergency Medicine

## 2012-04-15 DIAGNOSIS — S61409A Unspecified open wound of unspecified hand, initial encounter: Secondary | ICD-10-CM | POA: Insufficient documentation

## 2012-04-15 DIAGNOSIS — Z79899 Other long term (current) drug therapy: Secondary | ICD-10-CM | POA: Insufficient documentation

## 2012-04-15 DIAGNOSIS — W540XXA Bitten by dog, initial encounter: Secondary | ICD-10-CM | POA: Insufficient documentation

## 2012-04-15 DIAGNOSIS — F17200 Nicotine dependence, unspecified, uncomplicated: Secondary | ICD-10-CM | POA: Insufficient documentation

## 2012-04-15 MED ORDER — SULFAMETHOXAZOLE-TRIMETHOPRIM 800-160 MG PO TABS: 1.0000 | ORAL_TABLET | Freq: Two times a day (BID) | ORAL | Status: DC

## 2012-04-15 MED ORDER — TETANUS-DIPHTH-ACELL PERTUSSIS 5-2.5-18.5 LF-MCG/0.5 IM SUSP
0.5000 mL | Freq: Once | INTRAMUSCULAR | Status: AC
  Administered 2012-04-15: 0.5 mL via INTRAMUSCULAR
  Filled 2012-04-15: qty 0.5

## 2012-04-15 MED ORDER — CLINDAMYCIN HCL 150 MG PO CAPS: 450.0000 mg | ORAL_CAPSULE | Freq: Three times a day (TID) | ORAL | Status: DC

## 2012-04-15 NOTE — ED Provider Notes (Signed)
History     CSN: [id number]  Arrival date & time 04/15/12  4098   First MD Initiated Contact with Patient 04/15/12 [phone]      Chief Complaint  Patient presents with  . Animal Bite    (Consider location/radiation/quality/duration/timing/severity/associated sxs/prior treatment) HPI Comments: Patient reports that approximately 12 hours ago he was bitten in the left hand by his own dog.  He is presenting with laceration of the palmer surface of left hand.  All of the dogs vaccines are UTD.  Patient reports that after the bite he cleaned the wound with hydrogen peroxide, but did not seek medical treatment until today.  He denies any fever/chills  Denies any drainage from the wound.  Bleeding controlled at this time. He is able to move all of his fingers.  He is unsure of the date of his last tetanus.    The history is provided by the patient.    Past Medical History  Diagnosis Date  . Diverticulitis   . Heart murmur     Past Surgical History  Procedure Date  . Achilles tendon repair 06/2010    left; S/P tendon rupture    History reviewed. No pertinent family history.  History  Substance Use Topics  . Smoking status: Current Everyday Smoker -- 1.0 packs/day for 18 years    Types: Cigarettes  . Smokeless tobacco: Never Used  . Alcohol Use: 3.6 oz/week    6 Cans of beer per week      Review of Systems  Constitutional: Negative for fever and chills.  Respiratory: Negative for shortness of breath.   Gastrointestinal: Negative for nausea and vomiting.  Musculoskeletal: Negative for joint swelling.  Skin: Positive for wound.    Allergies  Review of patient's allergies indicates no known allergies.  Home Medications   Current Outpatient Rx  Name Route Sig Dispense Refill  . ACETAMINOPHEN 500 MG PO TABS Oral Take 1,000 mg by mouth every 6 (six) hours as needed. For pain    . HYPROMELLOSE 2.5 % OP SOLN Both Eyes Place 2 drops into both eyes daily as needed. For dry eyes      . IBUPROFEN 200 MG PO TABS Oral Take 600 mg by mouth every 6 (six) hours as needed. For pain      BP 164/113  Pulse 88  Temp(Src) 98 F (36.7 C) (Oral)  Resp 12  Ht 5\' 4"  (1.626 m)  Wt 185 lb (83.915 kg)  BMI 31.76 kg/m2  SpO2 96%  Physical Exam  Nursing note and vitals reviewed. Constitutional: He appears well-developed and well-nourished. No distress.  HENT:  Head: Normocephalic and atraumatic.  Mouth/Throat: Oropharynx is clear and moist.  Cardiovascular: Normal rate, regular rhythm and normal heart sounds.   Pulmonary/Chest: Effort normal and breath sounds normal.  Musculoskeletal: Normal range of motion.  Neurological: He is alert.  Skin: He is not diaphoretic.     Psychiatric: He has a normal mood and affect.    ED Course  Procedures (including critical care time)  Labs Reviewed - No data to display Dg Hand Complete Left  04/15/2012  *RADIOLOGY REPORT*  Clinical Data: History of abdominal bite.  Laceration on the hand. Injury in areas of first, second, and third metacarpals.  LEFT HAND - COMPLETE 3+ VIEW  Comparison: None.  Findings: There is no evidence of fracture or dislocation.  History given of soft tissue injury.  No opaque foreign body is evident. Alignment is normal.  IMPRESSION: History of soft tissue injury.  No evidence of opaque foreign body. No fracture or dislocation is evident.  Original Report Authenticated By: Crawford Givens, M.D.     No diagnosis found.    MDM  Patient with dog bite 12 hours ago.  Due to the bite occuring in the hand and occuring 12 hours ago the laceration was not sutured.  The laceration was cleaned well and explored.  Negative hand xray.  Patient uninsured and unable to afford Augmentin.  Therefore, patient given Rx for Bactrim DS and Clindamycin.  Tetanus given in the ED.  Return precautions given.        Pascal Lux [place], PA-C 04/15/12 1811

## 2012-04-15 NOTE — ED Notes (Signed)
Pt was trying to get his dog out of it's cage and it bit him in the L hand.  States dog has all vaccinations.  Does not remember last tetanus.

## 2012-04-16 NOTE — ED Provider Notes (Signed)
Medical screening examination/treatment/procedure(s) were performed by non-physician practitioner and as supervising physician I was immediately available for consultation/collaboration.  Rayyan Burley, MD 04/16/12 0742 

## 2012-04-17 ENCOUNTER — Emergency Department (HOSPITAL_COMMUNITY)
Admission: EM | Admit: 2012-04-17 | Discharge: 2012-04-17 | Disposition: A | Discharge status: Home / Self Care | Payer: Self-pay | Attending: Emergency Medicine | Admitting: Emergency Medicine

## 2012-04-17 ENCOUNTER — Encounter (HOSPITAL_COMMUNITY): Payer: Self-pay | Admitting: Family Medicine

## 2012-04-17 DIAGNOSIS — F17200 Nicotine dependence, unspecified, uncomplicated: Secondary | ICD-10-CM | POA: Insufficient documentation

## 2012-04-17 DIAGNOSIS — S61409A Unspecified open wound of unspecified hand, initial encounter: Secondary | ICD-10-CM | POA: Insufficient documentation

## 2012-04-17 DIAGNOSIS — S61459A Open bite of unspecified hand, initial encounter: Secondary | ICD-10-CM

## 2012-04-17 DIAGNOSIS — W540XXA Bitten by dog, initial encounter: Secondary | ICD-10-CM | POA: Insufficient documentation

## 2012-04-17 NOTE — ED Notes (Signed)
Pt seem here Saturday for dog bite to left hand. sts he is here to have rechecked.

## 2012-04-17 NOTE — Discharge Instructions (Signed)
Read the information below.  Continue taking your antibiotics as directed until they are gone. Return to the ER immediately if you develop redness, swelling, pus draining from the wound, or fevers greater than 100.4.  You may return to the ER at any time for worsening condition or any new symptoms that concern you.  Animal Bite An animal bite can result in a scratch on the skin, deep open cut, puncture of the skin, crush injury, or tearing away of the skin or a body part. Dogs are responsible for most animal bites. Children are bitten more often than adults. An animal bite can range from very mild to more serious. A small bite from your house pet is no cause for alarm. However, some animal bites can become infected or injure a bone or other tissue. You must seek medical care if:  The skin is broken and bleeding does not slow down or stop after 15 minutes.   The puncture is deep and difficult to clean (such as a cat bite).   Pain, warmth, redness, or pus develops around the wound.   The bite is from a stray animal or rodent. There may be a risk of rabies infection.   The bite is from a snake, raccoon, skunk, fox, coyote, or bat. There may be a risk of rabies infection.   The person bitten has a chronic illness such as diabetes, liver disease, or cancer, or the person takes medicine that lowers the immune system.   There is concern about the location and severity of the bite.  It is important to clean and protect an animal bite wound right away to prevent infection. Follow these steps:  Clean the wound with plenty of water and soap.   Apply an antibiotic cream.   Apply gentle pressure over the wound with a clean towel or gauze to slow or stop bleeding.   Elevate the affected area above the heart to help stop any bleeding.   Seek medical care. Getting medical care within 8 hours of the animal bite leads to the best possible outcome.  DIAGNOSIS  Your caregiver will most likely:  Take a  detailed history of the animal and the bite injury.   Perform a wound exam.   Take your medical history.  Blood tests or X-rays may be performed. Sometimes, infected bite wounds are cultured and sent to a lab to identify the infectious bacteria.  TREATMENT  Medical treatment will depend on the location and type of animal bite as well as the patient's medical history. Treatment may include:  Wound care, such as cleaning and flushing the wound with saline solution, bandaging, and elevating the affected area.   Antibiotics.   Tetanus immunization.   Rabies immunization.   Leaving the wound open to heal. This is often done with animal bites, due to the high risk of infection. However, in certain cases, wound closure with stitches, wound adhesive, skin adhesive strips, or staples may be used.  Infected bites that are left untreated may require intravenous (IV) antibiotics and surgical treatment in the hospital. HOME CARE INSTRUCTIONS  Follow your caregiver's instructions for wound care.   Take all medicines as directed.   If your caregiver prescribes antibiotics, take them as directed. Finish them even if you start to feel better.   Follow up with your caregiver for further exams or immunizations as directed.  You may need a tetanus shot if:  You cannot remember when you had your last tetanus shot.  You have never had a tetanus shot.   The injury broke your skin.  If you get a tetanus shot, your arm may swell, get red, and feel warm to the touch. This is common and not a problem. If you need a tetanus shot and you choose not to have one, there is a rare chance of getting tetanus. Sickness from tetanus can be serious. SEEK MEDICAL CARE IF:  You notice warmth, redness, soreness, swelling, pus discharge, or a bad smell coming from the wound.   You have a red line on the skin coming from the wound.   You have a fever, chills, or a general ill feeling.   You have nausea or  vomiting.   You have continued or worsening pain.   You have trouble moving the injured part.   You have other questions or concerns.  MAKE SURE YOU:  Understand these instructions.   Will watch your condition.   Will get help right away if you are not doing well or get worse.  Document Released: 08/22/2011 Document Revised: 11/23/2011 Document Reviewed: 08/22/2011 [facility] Patient Information 2012 [place], [place].  If you have no primary doctor, here are some resources that may be helpful:  Medicaid-accepting [facility] Providers:   - [facility]- [address]      [phone]      Mon-Fri 9am-7pm, Sat 9am-1pm   - [facility]- [address] [place]      [phone]   - [facility]- [address], [address] [place]      [phone]   [facility]- [address]      [phone]   - Renaye Rakers- [address] [place], [address]      [phone]      Only accepts [place] patients       after they have her name applied to their card   Self Pay (no insurance) in [place]:   - Sickle Cell Patients: Dr Willey Blade, [facility] Internal [facility]      [address] [place]      [phone]   - [facility] Connect253 [phone]   - Physician Referral Service- [phone]   - [facility]- [address] [place]      [phone]   [facility] [place]- [address]   - [facility]- see information above      (Speak to [employer] if you do not have insurance)   - [facility]- [address] [place]      [phone]   - [facility] [place]- [address]      [phone]   - [facility]- [address]      [phone]   - Dr Julio Sicks-  [address], [place]      [phone]   - [facility]- [address]      [phone]   - [facility]- [address]      [phone]      Also [address]      [phone]   - [facility]- [address]      [phone]      1st and 3rd Saturday every month, 10am-1pm Other agencies that provide inexpensive medical care:    [facility]  [phone]    [facility]  [phone]    [facility]  [phone]    [facility]  [phone]    [facility]  [phone]  General Information: Finding a doctor when you do not have health insurance can be tricky. Although you are not limited by an insurance plan, you are of course limited by her finances and how much but he can pay out of pocket.  What are your options if you don't have health insurance?   1) Find a Librarian, academic and Pay Out of Pocket Although you won't have to find out who is covered by your insurance plan, it is a good idea to ask around and get recommendations. You will then need to call the office and see if the doctor you have chosen will accept you as a new patient and what types of options they offer for patients who are self-pay. Some doctors offer discounts or will set up payment plans for their patients who do not have insurance, but you will need to ask so you aren't surprised when you get to your appointment.  2) Contact Your Local Health Department Not all health departments have doctors that can see patients for sick visits, but many do, so it is worth a call to see if yours does. If you don't know where your local health department is, you can check in your phone book. The CDC also has a tool to help you locate your state's health department, and many state websites also have listings of all of their local health departments.  3) Find a Walk-in Clinic If your illness is not likely to be very severe or complicated, you may want to try a walk in clinic. These are popping up all over the country in pharmacies, drugstores, and shopping centers. They're usually staffed by nurse practitioners or physician assistants that  have been trained to treat common illnesses and complaints. They're usually fairly quick and inexpensive. However, if you have serious medical issues or chronic medical problems, these are probably not your best option  RESOURCE GUIDE  Dental Problems  Patients with Medicaid: [facility] [phone]                                           [phone] [address] [employer] Phone:  [phone]                                                  Phone:  [phone]  If unable to pay or uninsured, contact:  [facility] or [facility]. to become qualified for the adult dental clinic.  Chronic Pain Problems Contact [facility]  [phone] Patients need to be referred by their primary care doctor.  Insufficient Money for Medicine Contact [employer] Way:  call "211" or [facility] [phone].  No Primary Care Doctor Call [facility]  [phone] Other agencies that provide inexpensive medical care    [facility]  [phone]    [facility]  [phone]    [facility]  [facility]  [phone]    Women's Clinic  [phone]    [facility]  [phone]    [facility]  [phone]  Psychological Services [facility]  [phone] [facility]  [phone] [facility]   [phone] (emergency services [phone])  Substance Abuse Resources Alcohol and Drug Services  [phone] [facility] [phone] [facility] [place] [phone] Floydene Flock [phone] Residential & Outpatient Substance Abuse Program  [phone]  Abuse/Neglect [facility] Child Abuse Hotline 941-478-1929 [facility] Child Abuse Hotline [phone] (After Hours)  Emergency Shelter [facility] [phone]  Maternity Homes [facility] [place] [facility] [place] [phone] [facility] [phone]  MRSA Hotline #:    609-015-9351    [facility] Resources  [facility] [place]     United [employer]                          [facility] [address] [place]                       [address]      [address]  Blondell Reveal Phone:  [phone]                                   Phone:  [phone]                 Phone:  [phone]  [facility] Phone:  [phone]  [facility] Child Abuse Hotline [phone] [phone] (After Hours)

## 2012-04-17 NOTE — ED Notes (Signed)
NAD noted at time of d/c home. D/C inst given by Chad, PA-C. Pt verbalized understanding

## 2012-04-17 NOTE — ED Notes (Signed)
Pt here for re-evaluation of left hand wound from dog bite. Pt states he has taken abx as prescribed, no fever or chills, pt reports no drainage from wound. Laceration to left palm of hand open with pink granulation tissue showing. No redness, drainage or bleeding noted.

## 2012-04-17 NOTE — ED Provider Notes (Signed)
History     CSN: [id number]  Arrival date & time 04/17/12  1055   First MD Initiated Contact with Patient 04/17/12 1143      Chief Complaint  Patient presents with  . Hand Pain    (Consider location/radiation/quality/duration/timing/severity/associated sxs/prior treatment) HPI Comments: Patient presents to ED for recheck of left hand.  Seen previously in the emergency department after his dog bit his left hand.  Has been taking the antibiotics as he thought they were prescribed (one TID, one once daily).  Denies fevers, red streaks up his arm, any redness, swelling, discharge from the wound.  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    Patient is a 36 y.o. male presenting with hand pain. The history is provided by the patient.  Hand Pain Pertinent negatives include no arthralgias, chills, fever, myalgias, numbness, rash or weakness.    Past Medical History  Diagnosis Date  . Diverticulitis   . Heart murmur     Past Surgical History  Procedure Date  . Achilles tendon repair 06/2010    left; S/P tendon rupture    History reviewed. No pertinent family history.  History  Substance Use Topics  . Smoking status: Current Everyday Smoker -- 1.0 packs/day for 18 years    Types: Cigarettes  . Smokeless tobacco: Never Used  . Alcohol Use: 3.6 oz/week    6 Cans of beer per week      Review of Systems  Constitutional: Negative for fever and chills.  Musculoskeletal: Negative for myalgias and arthralgias.  Skin: Positive for wound. Negative for rash.  Neurological: Negative for weakness and numbness.    Allergies  Review of patient's allergies indicates no known allergies.  Home Medications   Current Outpatient Rx  Name Route Sig Dispense Refill  . CLINDAMYCIN HCL 150 MG PO CAPS Oral Take 450 mg by mouth 3 (three) times daily. Starting 04/15/12 for 10 days    . SULFAMETHOXAZOLE-TRIMETHOPRIM 800-160 MG PO  TABS Oral Take 1 tablet by mouth 2 (two) times daily. Starting 04/15/12 for 10 days      BP 155/96  Pulse 86  Resp 18  SpO2 97%  Physical Exam  Nursing note and vitals reviewed. Constitutional: He appears well-developed and well-nourished. No distress.  HENT:  Head: Normocephalic and atraumatic.  Neck: Neck supple.  Pulmonary/Chest: Effort normal.  Musculoskeletal:       Left palm with clean healing wound.  No erythema, no edema, no discharge, no warmth.  Radial pulses intact, pt moves all fingers easily.   Neurological: He is alert. He exhibits normal muscle tone.  Skin: He is not diaphoretic.  Psychiatric: He has a normal mood and affect. His behavior is normal. Judgment and thought content normal.    ED Course  Procedures (including critical care time)  Labs Reviewed - No data to display No results found.   1. Dog bite of hand without complication       MDM  Pt presents for follow up after being seen and treated for dog bite to left hand.  Dog was reported UTD on vaccinations.  Pt advised on how to take his antibiotics properly (pt was apparently taking Bactrim once daily).  Despite this, the wound is healing very well.  Neurovascularly intact, no signs of infection.  I have discussed wound care with him and have advised him to continue taking his antibiotics until they are gone.  Discussed return precautions.  Patient verbalizes understanding  and agrees with plan.          Rise Patience, [place] 04/17/12 1325

## 2012-04-20 NOTE — ED Provider Notes (Signed)
Medical screening examination/treatment/procedure(s) were performed by non-physician practitioner and as supervising physician I was immediately available for consultation/collaboration.  Tyera Hansley, MD 04/20/12 0844 

## 2013-06-02 ENCOUNTER — Emergency Department (HOSPITAL_COMMUNITY)
Admission: EM | Admit: 2013-06-02 | Discharge: 2013-06-02 | Disposition: A | Discharge status: Home / Self Care | Payer: Self-pay | Attending: Emergency Medicine | Admitting: Emergency Medicine

## 2013-06-02 ENCOUNTER — Encounter (HOSPITAL_COMMUNITY): Payer: Self-pay | Admitting: Emergency Medicine

## 2013-06-02 DIAGNOSIS — L0291 Cutaneous abscess, unspecified: Secondary | ICD-10-CM

## 2013-06-02 DIAGNOSIS — F17200 Nicotine dependence, unspecified, uncomplicated: Secondary | ICD-10-CM | POA: Insufficient documentation

## 2013-06-02 DIAGNOSIS — R011 Cardiac murmur, unspecified: Secondary | ICD-10-CM | POA: Insufficient documentation

## 2013-06-02 DIAGNOSIS — Z8719 Personal history of other diseases of the digestive system: Secondary | ICD-10-CM | POA: Insufficient documentation

## 2013-06-02 DIAGNOSIS — N61 Mastitis without abscess: Secondary | ICD-10-CM | POA: Insufficient documentation

## 2013-06-02 MED ORDER — SULFAMETHOXAZOLE-TRIMETHOPRIM 800-160 MG PO TABS: 1.0000 | ORAL_TABLET | Freq: Two times a day (BID) | ORAL | Status: DC

## 2013-06-02 MED ORDER — HYDROCODONE-ACETAMINOPHEN 5-325 MG PO TABS: 2.0000 | ORAL_TABLET | Freq: Four times a day (QID) | ORAL | Status: DC | PRN

## 2013-06-02 MED ORDER — PROMETHAZINE HCL 25 MG PO TABS: 25.0000 mg | ORAL_TABLET | Freq: Four times a day (QID) | ORAL | Status: DC | PRN

## 2013-06-02 NOTE — ED Provider Notes (Signed)
History     CSN: [id number]  Arrival date & time 06/02/13  1120   First MD Initiated Contact with Patient 06/02/13 1243      Chief Complaint  Patient presents with  . Wound Infection    (Consider location/radiation/quality/duration/timing/severity/associated sxs/prior treatment) HPI Comments: Patient is a 37 year old male history of prior dental abscess who presents today with right breast pain since Wednesday (5 days ago). The area has been increasing in size, pain, redness. It is a sharp, non radiating pain, worse with palpation. He has been putting Neosporin on the area without relief. Denies history of diabetes. He denies any fevers, chills, nausea, vomiting, abdominal pain.  The history is provided by the patient. No language interpreter was used.    Past Medical History  Diagnosis Date  . Diverticulitis   . Heart murmur     Past Surgical History  Procedure Laterality Date  . Achilles tendon repair  06/2010    left; S/P tendon rupture    No family history on file.  History  Substance Use Topics  . Smoking status: Current Every Day Smoker -- 1.00 packs/day for 18 years    Types: Cigarettes  . Smokeless tobacco: Never Used  . Alcohol Use: 3.6 oz/week    6 Cans of beer per week      Review of Systems  Constitutional: Negative for fever and chills.  Respiratory: Negative for shortness of breath.   Gastrointestinal: Negative for nausea, vomiting and abdominal pain.  Skin: Positive for wound.  All other systems reviewed and are negative.    Allergies  Review of patient's allergies indicates no known allergies.  Home Medications  No current outpatient prescriptions on file.  BP 155/104  Pulse 78  Temp(Src) 98.1 F (36.7 C) (Oral)  Resp 16  SpO2 96%  Physical Exam  Nursing note and vitals reviewed. Constitutional: He is oriented to person, place, and time. He appears well-developed and well-nourished. No distress.  HENT:  Head: Normocephalic and  atraumatic.  Right Ear: External ear normal.  Left Ear: External ear normal.  Nose: Nose normal.  Eyes: Conjunctivae are normal.  Neck: Normal range of motion. Neck supple. No tracheal deviation present.  Cardiovascular: Normal rate, regular rhythm and normal heart sounds.   Pulmonary/Chest: Effort normal and breath sounds normal. No stridor.  Abdominal: Soft. He exhibits no distension. There is no tenderness.  Musculoskeletal: Normal range of motion.  Neurological: He is alert and oriented to person, place, and time.  Skin: Skin is warm and dry. He is not diaphoretic. There is erythema.  Is a 4 cm area of induration and fluctuance on the right medial breast next to nipple. There is surrounding erythema without streaking.  Psychiatric: He has a normal mood and affect. His behavior is normal.    ED Course  Procedures (including critical care time)  INCISION AND DRAINAGE Performed by: Junious Silk Consent: Verbal consent obtained. Risks and benefits: risks, benefits and alternatives were discussed Type: abscess  Body area: right breast  Anesthesia: local infiltration  Incision was made with a scalpel.  Local anesthetic: lidocaine 2% 1 epinephrine  Anesthetic total: 3 ml  Complexity: complex Blunt dissection to break up loculations  Drainage: purulent  Drainage amount: copious  Packing material: 1/4 in iodoform gauze  Patient tolerance: Patient tolerated the procedure well with no immediate complications.     Labs Reviewed  WOUND CULTURE   No results found.   1. Abscess       MDM  Patient  with skin abscess amenable to incision and drainage.  Wound recheck in 2 days and packing removal. Encouraged home warm soaks and flushing.  Mild signs of cellulitis is surrounding skin.  Will d/c to home.  Bactrim given. Wound culture sent.        Mora Bellman, PA-C 06/02/13 [phone]

## 2013-06-02 NOTE — ED Notes (Signed)
Rt breast infection since last wed states mashed it a little and now it is redder and more swollen

## 2013-06-02 NOTE — Discharge Instructions (Signed)
Abscess °An abscess (boil or furuncle) is an infected area on or under the skin. This area is filled with yellowish-white fluid (pus) and other material (debris). °HOME CARE  °· Only take medicines as told by your doctor. °· If you were given antibiotic medicine, take it as directed. Finish the medicine even if you start to feel better. °· If gauze is used, follow your doctor's directions for changing the gauze. °· To avoid spreading the infection: °· Keep your abscess covered with a bandage. °· Wash your hands well. °· Do not share personal care items, towels, or whirlpools with others. °· Avoid skin contact with others. °· Keep your skin and clothes clean around the abscess. °· Keep all doctor visits as told. °GET HELP RIGHT AWAY IF:  °· You have more pain, puffiness (swelling), or redness in the wound site. °· You have more fluid or blood coming from the wound site. °· You have muscle aches, chills, or you feel sick. °· You have a fever. °MAKE SURE YOU:  °· Understand these instructions. °· Will watch your condition. °· Will get help right away if you are not doing well or get worse. °Document Released: 05/22/2008 Document Revised: 06/04/2012 Document Reviewed: 02/16/2012 °ExitCare® Patient Information ©2014 [employer]. ° °Abscess °Care After °An abscess (also called a boil or furuncle) is an infected area that contains a collection of pus. Signs and symptoms of an abscess include pain, tenderness, redness, or hardness, or you may feel a moveable soft area under your skin. An abscess can occur anywhere in the body. The infection may spread to surrounding tissues causing cellulitis. A cut (incision) by the surgeon was made over your abscess and the pus was drained out. Gauze may have been packed into the space to provide a drain that will allow the cavity to heal from the inside outwards. The boil may be painful for 5 to 7 days. Most people with a boil do not have high fevers. Your abscess, if seen early, may  not have localized, and may not have been lanced. If not, another appointment may be required for this if it does not get better on its own or with medications. °HOME CARE INSTRUCTIONS  °· Only take over-the-counter or prescription medicines for pain, discomfort, or fever as directed by your caregiver. °· When you bathe, soak and then remove gauze or iodoform packs at least daily or as directed by your caregiver. You may then wash the wound gently with mild soapy water. Repack with gauze or do as your caregiver directs. °SEEK IMMEDIATE MEDICAL CARE IF:  °· You develop increased pain, swelling, redness, drainage, or bleeding in the wound site. °· You develop signs of generalized infection including muscle aches, chills, fever, or a general ill feeling. °· An oral temperature above 102° F (38.9° C) develops, not controlled by medication. °See your caregiver for a recheck if you develop any of the symptoms described above. If medications (antibiotics) were prescribed, take them as directed. °Document Released: 06/22/2005 Document Revised: 02/26/2012 Document Reviewed: 02/17/2008 °ExitCare® Patient Information ©2014 [employer]. ° °

## 2013-06-03 NOTE — ED Provider Notes (Signed)
Medical screening examination/treatment/procedure(s) were conducted as a shared visit with non-physician practitioner(s) and myself.  I personally evaluated the patient during the encounter Has squeezed a pimple on his left chest next to his left nipple, and it has abscessed.  Advised I&D.  Carleene Cooper III, MD 06/03/13 [phone]

## 2013-06-04 ENCOUNTER — Emergency Department (HOSPITAL_COMMUNITY)
Admission: EM | Admit: 2013-06-04 | Discharge: 2013-06-04 | Disposition: A | Discharge status: Home / Self Care | Payer: BC Managed Care – PPO | Attending: Emergency Medicine | Admitting: Emergency Medicine

## 2013-06-04 ENCOUNTER — Emergency Department (HOSPITAL_COMMUNITY)
Admission: EM | Admit: 2013-06-04 | Discharge: 2013-06-05 | Disposition: A | Discharge status: Home / Self Care | Payer: BC Managed Care – PPO | Attending: Emergency Medicine | Admitting: Emergency Medicine

## 2013-06-04 ENCOUNTER — Encounter (HOSPITAL_COMMUNITY): Payer: Self-pay | Admitting: *Deleted

## 2013-06-04 DIAGNOSIS — Z8719 Personal history of other diseases of the digestive system: Secondary | ICD-10-CM | POA: Insufficient documentation

## 2013-06-04 DIAGNOSIS — Z792 Long term (current) use of antibiotics: Secondary | ICD-10-CM | POA: Insufficient documentation

## 2013-06-04 DIAGNOSIS — Z8679 Personal history of other diseases of the circulatory system: Secondary | ICD-10-CM | POA: Insufficient documentation

## 2013-06-04 DIAGNOSIS — L02219 Cutaneous abscess of trunk, unspecified: Secondary | ICD-10-CM | POA: Insufficient documentation

## 2013-06-04 DIAGNOSIS — R011 Cardiac murmur, unspecified: Secondary | ICD-10-CM | POA: Insufficient documentation

## 2013-06-04 DIAGNOSIS — F17200 Nicotine dependence, unspecified, uncomplicated: Secondary | ICD-10-CM | POA: Insufficient documentation

## 2013-06-04 DIAGNOSIS — L02213 Cutaneous abscess of chest wall: Secondary | ICD-10-CM

## 2013-06-04 DIAGNOSIS — L0291 Cutaneous abscess, unspecified: Secondary | ICD-10-CM

## 2013-06-04 DIAGNOSIS — K5732 Diverticulitis of large intestine without perforation or abscess without bleeding: Secondary | ICD-10-CM | POA: Insufficient documentation

## 2013-06-04 DIAGNOSIS — Z4802 Encounter for removal of sutures: Secondary | ICD-10-CM | POA: Insufficient documentation

## 2013-06-04 DIAGNOSIS — L03319 Cellulitis of trunk, unspecified: Secondary | ICD-10-CM | POA: Insufficient documentation

## 2013-06-04 NOTE — ED Notes (Signed)
Pt reports incision is not draining copiously and does not have foul smell; pain is better; redness better. Reports that he has been compliant with antibiotics.

## 2013-06-04 NOTE — ED Provider Notes (Signed)
History     CSN: [id number]  Arrival date & time 06/04/13  1049   First MD Initiated Contact with Patient 06/04/13 1102      Chief Complaint  Patient presents with  . Wound Check    (Consider location/radiation/quality/duration/timing/severity/associated sxs/prior treatment) HPI Comments: Patient is a 37 year old male who presents for recheck of an abscess of his R chest wall. Patient was seen for abscess 2 days ago and tx in ED with I&D. Patient placed on Bactrim prior to d/c on 06/02/2013. Patient states abscess has been improving since I&D and has been mildly tender to palpation, but the pain medicine he received on 06/02/13 has been helping him. Patient states surrounding erythema and induration has also been improving. Patient denies fevers, chills, sweats, CP, SOB, N/V, and numbness or tingling in his extremities.  The history is provided by the patient. No language interpreter was used.    Past Medical History  Diagnosis Date  . Diverticulitis   . Heart murmur     Past Surgical History  Procedure Laterality Date  . Achilles tendon repair  06/2010    left; S/P tendon rupture    No family history on file.  History  Substance Use Topics  . Smoking status: Current Every Day Smoker -- 1.00 packs/day for 18 years    Types: Cigarettes  . Smokeless tobacco: Never Used  . Alcohol Use: 3.6 oz/week    6 Cans of beer per week      Review of Systems  Constitutional: Negative for fever and chills.  Respiratory: Negative for shortness of breath.   Gastrointestinal: Negative for nausea and vomiting.  Skin: Positive for color change (erythema which is resolving). Negative for pallor.  Neurological: Negative for numbness.  All other systems reviewed and are negative.    Allergies  Review of patient's allergies indicates no known allergies.  Home Medications   Current Outpatient Rx  Name  Route  Sig  Dispense  Refill  . HYDROcodone-acetaminophen (NORCO/VICODIN) 5-325 MG  per tablet   Oral   Take 2 tablets by mouth every 6 (six) hours as needed for pain.   6 tablet   0   . sulfamethoxazole-trimethoprim (SEPTRA DS) 800-160 MG per tablet   Oral   Take 1 tablet by mouth every 12 (twelve) hours.   20 tablet   0     BP 140/101  Pulse 86  Temp(Src) 97.9 F (36.6 C) (Oral)  Resp 18  SpO2 97%  Physical Exam  Nursing note and vitals reviewed. Constitutional: He is oriented to person, place, and time. He appears well-developed and well-nourished. No distress.  Patient well and nontoxic appearing, in no acute distress.  HENT:  Head: Normocephalic and atraumatic.  Eyes: Conjunctivae and EOM are normal. No scleral icterus.  Neck: Normal range of motion. Neck supple.  Cardiovascular: Normal rate, regular rhythm, normal heart sounds and intact distal pulses.   Pulmonary/Chest: Effort normal and breath sounds normal. No respiratory distress. He exhibits no tenderness.  Lymphadenopathy:    He has no cervical adenopathy.  Neurological: He is alert and oriented to person, place, and time.  No sensory or motor deficits appreciated.  Skin: Skin is warm and dry. No rash noted. He is not diaphoretic. There is erythema (mild, resolving).     Abscess of R chest wall with mild surrounding erythema and surrounding induration. I&D incision approximately 1.5cm in diameter. No pus-like discharge appreciated. No heat-to-touch or swelling. No pallor.  Psychiatric: He has a normal mood  and affect. His behavior is normal.    ED Course  Procedures (including critical care time)  Labs Reviewed - No data to display No results found.   1. Abscess     MDM  Uncomplicated abscess of R chest - based on history and physical exam, abscess appears to be resolving appropriately. No physical exam findings concerning for worsening infection or abscess; no linear streaking, heat-to-touch, or significant swelling. Patient afebrile and nontoxic appearing. Will d/c with instruction  to maintain dressing changes and take ibuprofen as needed for discomfort. Patient also told to continue abx as prescribed for full course. Indications for ED return discussed with patient who verbalizes comfort and understanding with plan without any unaddressed concerns.        Antony Madura, PA-C 06/09/13 1641

## 2013-06-04 NOTE — ED Notes (Signed)
The pt had a wound drained here to the chest.  He was seen this am and he is back tonicht for pain management

## 2013-06-05 ENCOUNTER — Telehealth (HOSPITAL_COMMUNITY): Payer: Self-pay | Admitting: Emergency Medicine

## 2013-06-05 LAB — WOUND CULTURE

## 2013-06-05 MED ORDER — HYDROCODONE-ACETAMINOPHEN 5-325 MG PO TABS: 1.0000 | ORAL_TABLET | ORAL | Status: DC | PRN

## 2013-06-05 MED ORDER — CEPHALEXIN 500 MG PO CAPS: 500.0000 mg | ORAL_CAPSULE | Freq: Four times a day (QID) | ORAL | Status: DC

## 2013-06-05 NOTE — ED Provider Notes (Signed)
History     CSN: [id number]  Arrival date & time 06/04/13  2303   First MD Initiated Contact with Patient 06/04/13 2350      Chief Complaint  Patient presents with  . wound pain    HPI  History provided by the patient. Patient's 37 year old male who returns for recheck of an abscess infection to his right chest area. Patient was seen 2 days ago and had an I&D performed for an abscess in his right chest and nipple area. He reports that there was a lot of pus straining during the procedure. Since that time he has only had a small amount of bleeding from the wound. He feels the size has decreased but he continues to have tenderness and soreness to the skin. He was given a small prescription for Vicodin and has been taking this with good relief. He does have 2 pills remaining of his prescription. He has also been taking Septra as prescribed with last dose earlier this morning. He plans to take additional dose tonight. He was also seen for recheck this morning but returned because of increased pains. Denies any associated fever, chills or sweats. No other changes in symptoms. 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     Past Medical History  Diagnosis Date  . Diverticulitis   . Heart murmur     Past Surgical History  Procedure Laterality Date  . Achilles tendon repair  06/2010    left; S/P tendon rupture    No family history on file.  History  Substance Use Topics  . Smoking status: Current Every Day Smoker -- 1.00 packs/day for 18 years    Types: Cigarettes  . Smokeless tobacco: Never Used  . Alcohol Use: 3.6 oz/week    6 Cans of beer per week      Review of Systems  Constitutional: Negative for fever, chills and diaphoresis.  All other systems reviewed and are negative.    Allergies  Review of patient's allergies indicates no known allergies.  Home Medications   Current Outpatient Rx  Name  Route  Sig  Dispense  Refill  .  HYDROcodone-acetaminophen (NORCO/VICODIN) 5-325 MG per tablet   Oral   Take 2 tablets by mouth every 6 (six) hours as needed for pain.   6 tablet   0   . sulfamethoxazole-trimethoprim (BACTRIM DS,SEPTRA DS) 800-160 MG per tablet   Oral   Take 1 tablet by mouth every 12 (twelve) hours. For 7-10 days; Start date 06/02/13         . HYDROcodone-acetaminophen (NORCO) 5-325 MG per tablet   Oral   Take 1-2 tablets by mouth every 4 (four) hours as needed for pain.   6 tablet   0     BP 149/100  Pulse 89  Temp(Src) 98.1 F (36.7 C) (Oral)  Resp 16  SpO2 95%  Physical Exam  Nursing note and vitals reviewed. Constitutional: He is oriented to person, place, and time. He appears well-developed and well-nourished. No distress.  HENT:  Head: Normocephalic and atraumatic.  Right Ear: External ear normal.  Left Ear: External ear normal.  Nose: Nose normal.  Eyes: Conjunctivae are normal.  Neck: Normal range of motion. Neck supple. No tracheal deviation present.  Cardiovascular: Normal rate, regular rhythm and normal heart sounds.   Pulmonary/Chest: Effort normal and breath sounds normal. No stridor. No respiratory distress. He has no wheezes. He has no rales.  See skin exam  Abdominal: Soft.  He exhibits no distension. There is no tenderness.  Musculoskeletal: Normal range of motion.  Neurological: He is alert and oriented to person, place, and time.  Skin: Skin is warm and dry. He is not diaphoretic. There is erythema.  There is a 6 cm area of induration and fluctuance on the right medial breast next to nipple. There is surrounding erythema without streaking. Small incision without any bleeding or drainage to the medial aspect of areola. No bleeding or drainage from the nipple  Psychiatric: He has a normal mood and affect. His behavior is normal.    ED Course  Procedures      1. Abscess of chest wall       MDM  Patient seen and evaluated. Patient well-appearing in no acute  distress. He does not appear severely ill or toxic.  Per the patient's infection has been improving significantly and reducing in size.        Angus Seller, PA-C 06/05/13 0111

## 2013-06-05 NOTE — ED Notes (Signed)
Pt returned to ER for further evaluation of right breast abscess that was drained on the 16th. States that he came to the ER yesterday am for wound recheck but breast still hurts.

## 2013-06-05 NOTE — ED Provider Notes (Signed)
Medical screening examination/treatment/procedure(s) were performed by non-physician practitioner and as supervising physician I was immediately available for consultation/collaboration.  Jones Skene, M.D.     Jones Skene, MD 06/05/13 [phone]

## 2013-06-05 NOTE — ED Notes (Signed)
PA at the bedside assessing abscess with an ultrasound machine.

## 2013-06-05 NOTE — ED Notes (Signed)
+  Wound. +MRSA. Patient treated with Bactrim. Sensitive to same. Per protocol MD. Will call and inform patient of +MRSA and appropriate treatment.

## 2013-06-06 ENCOUNTER — Telehealth (HOSPITAL_COMMUNITY): Payer: Self-pay | Admitting: Emergency Medicine

## 2013-06-07 ENCOUNTER — Telehealth (HOSPITAL_COMMUNITY): Payer: Self-pay | Admitting: Emergency Medicine

## 2013-06-12 NOTE — ED Provider Notes (Signed)
Medical screening examination/treatment/procedure(s) were performed by non-physician practitioner and as supervising physician I was immediately available for consultation/collaboration.   Carleene Cooper III, MD 06/12/13 [phone]

## 2013-06-27 ENCOUNTER — Emergency Department (HOSPITAL_COMMUNITY): Payer: Managed Care, Other (non HMO)

## 2013-06-27 ENCOUNTER — Encounter (HOSPITAL_COMMUNITY): Payer: Self-pay | Admitting: *Deleted

## 2013-06-27 ENCOUNTER — Emergency Department (HOSPITAL_COMMUNITY)
Admission: EM | Admit: 2013-06-27 | Discharge: 2013-06-27 | Disposition: A | Discharge status: Home / Self Care | Payer: Managed Care, Other (non HMO) | Attending: Emergency Medicine | Admitting: Emergency Medicine

## 2013-06-27 DIAGNOSIS — Z8719 Personal history of other diseases of the digestive system: Secondary | ICD-10-CM | POA: Insufficient documentation

## 2013-06-27 DIAGNOSIS — Z79899 Other long term (current) drug therapy: Secondary | ICD-10-CM | POA: Insufficient documentation

## 2013-06-27 DIAGNOSIS — R109 Unspecified abdominal pain: Secondary | ICD-10-CM | POA: Insufficient documentation

## 2013-06-27 DIAGNOSIS — F17200 Nicotine dependence, unspecified, uncomplicated: Secondary | ICD-10-CM | POA: Insufficient documentation

## 2013-06-27 DIAGNOSIS — N451 Epididymitis: Secondary | ICD-10-CM

## 2013-06-27 DIAGNOSIS — R011 Cardiac murmur, unspecified: Secondary | ICD-10-CM | POA: Insufficient documentation

## 2013-06-27 DIAGNOSIS — N5089 Other specified disorders of the male genital organs: Secondary | ICD-10-CM | POA: Insufficient documentation

## 2013-06-27 DIAGNOSIS — N453 Epididymo-orchitis: Secondary | ICD-10-CM | POA: Insufficient documentation

## 2013-06-27 LAB — BASIC METABOLIC PANEL
Calcium: 9.4 mg/dL (ref 8.4–10.5)
GFR calc Af Amer: >90 mL/min (ref >90)
GFR calc non Af Amer: 86 mL/min — ABNORMAL LOW (ref >90)
Glucose, Bld: 98 mg/dL (ref 70–99)
Potassium: 3.6 mEq/L (ref 3.5–5.1)
Sodium: 137 mEq/L (ref 135–145)

## 2013-06-27 LAB — CBC WITH DIFFERENTIAL/PLATELET
Basophils Absolute: 0 10*3/uL (ref 0.0–0.1)
Basophils Relative: 0 % (ref 0–1)
Eosinophils Absolute: 0.1 10*3/uL (ref 0.0–0.7)
Eosinophils Relative: 1 % (ref 0–5)
MCH: 34.8 pg — ABNORMAL HIGH (ref 26.0–34.0)
MCHC: 36 g/dL (ref 30.0–36.0)
MCV: 96.5 fL (ref 78.0–100.0)
Neutrophils Relative %: 78 % — ABNORMAL HIGH (ref 43–77)
Platelets: 199 10*3/uL (ref 150–400)
RBC: 4.34 MIL/uL (ref 4.22–5.81)
RDW: 12 % (ref 11.5–15.5)

## 2013-06-27 LAB — URINALYSIS, ROUTINE W REFLEX MICROSCOPIC
Glucose, UA: NEGATIVE mg/dL
Protein, ur: NEGATIVE mg/dL
pH: 6 (ref 5.0–8.0)

## 2013-06-27 LAB — URINE MICROSCOPIC-ADD ON

## 2013-06-27 MED ORDER — NAPROXEN 500 MG PO TABS: 500.0000 mg | ORAL_TABLET | Freq: Two times a day (BID) | ORAL | Status: DC

## 2013-06-27 MED ORDER — LIDOCAINE HCL (PF) 1 % IJ SOLN
INTRAMUSCULAR | Status: AC
  Administered 2013-06-27: 1.2 mL
  Filled 2013-06-27: qty 5

## 2013-06-27 MED ORDER — ONDANSETRON HCL 4 MG/2ML IJ SOLN
4.0000 mg | Freq: Once | INTRAMUSCULAR | Status: AC
  Administered 2013-06-27: 4 mg via INTRAVENOUS
  Filled 2013-06-27: qty 2

## 2013-06-27 MED ORDER — AZITHROMYCIN 250 MG PO TABS
1000.0000 mg | ORAL_TABLET | Freq: Once | ORAL | Status: AC
  Administered 2013-06-27: 1000 mg via ORAL
  Filled 2013-06-27: qty 4

## 2013-06-27 MED ORDER — LEVOFLOXACIN 500 MG PO TABS: 500.0000 mg | ORAL_TABLET | Freq: Every day | ORAL | Status: DC

## 2013-06-27 MED ORDER — LEVOFLOXACIN IN D5W 750 MG/150ML IV SOLN
750.0000 mg | Freq: Once | INTRAVENOUS | Status: AC
  Administered 2013-06-27: 750 mg via INTRAVENOUS
  Filled 2013-06-27: qty 150

## 2013-06-27 MED ORDER — CEFTRIAXONE SODIUM 250 MG IJ SOLR
250.0000 mg | Freq: Once | INTRAMUSCULAR | Status: AC
  Administered 2013-06-27: 250 mg via INTRAMUSCULAR
  Filled 2013-06-27: qty 250

## 2013-06-27 MED ORDER — HYDROMORPHONE HCL PF 1 MG/ML IJ SOLN
1.0000 mg | Freq: Once | INTRAMUSCULAR | Status: AC
  Administered 2013-06-27: 1 mg via INTRAVENOUS
  Filled 2013-06-27: qty 1

## 2013-06-27 MED ORDER — MORPHINE SULFATE 4 MG/ML IJ SOLN
4.0000 mg | Freq: Once | INTRAMUSCULAR | Status: AC
  Administered 2013-06-27: 4 mg via INTRAVENOUS
  Filled 2013-06-27: qty 1

## 2013-06-27 MED ORDER — OXYCODONE-ACETAMINOPHEN 5-325 MG PO TABS: 2.0000 | ORAL_TABLET | ORAL | Status: DC | PRN

## 2013-06-27 NOTE — ED Notes (Signed)
Pt in c/o left testicle pain and swelling since Sunday, also left groin pain, denies pain with urination, pt c/o pain with movement, pt restless in triage

## 2013-06-27 NOTE — ED Provider Notes (Signed)
History    CSN: [id number] Arrival date & time 06/27/13  1114  First MD Initiated Contact with Patient 06/27/13 1134     Chief Complaint  Patient presents with  . Testicle Pain  . Groin Pain   (Consider location/radiation/quality/duration/timing/severity/associated sxs/prior Treatment) HPI Comments: Patient is a 37 year old male with a past medical history of diverticulitis who presents with a 5 day history of left testicular and left inguinal pain. Symptoms started gradually and progressively worsened since the onset. The pain is throbbing and severe without radiation. Palpation and movement of the scrotum make the pain worse. No alleviating factors. Patient reports associated scrotal swelling. Patient denies any other symptoms.   Patient is a 37 y.o. male presenting with testicular pain and groin pain.  Testicle Pain  Groin Pain   Past Medical History  Diagnosis Date  . Diverticulitis   . Heart murmur    Past Surgical History  Procedure Laterality Date  . Achilles tendon repair  06/2010    left; S/P tendon rupture   History reviewed. No pertinent family history. History  Substance Use Topics  . Smoking status: Current Every Day Smoker -- 1.00 packs/day for 18 years    Types: Cigarettes  . Smokeless tobacco: Never Used  . Alcohol Use: 3.6 oz/week    6 Cans of beer per week    Review of Systems  Genitourinary: Positive for scrotal swelling and testicular pain.  All other systems reviewed and are negative.    Allergies  Review of patient's allergies indicates no known allergies.  Home Medications   Current Outpatient Rx  Name  Route  Sig  Dispense  Refill  . cephALEXin (KEFLEX) 500 MG capsule   Oral   Take 1 capsule (500 mg total) by mouth 4 (four) times daily.   28 capsule   0   . HYDROcodone-acetaminophen (NORCO) 5-325 MG per tablet   Oral   Take 1-2 tablets by mouth every 4 (four) hours as needed for pain.   6 tablet   0   .  HYDROcodone-acetaminophen (NORCO/VICODIN) 5-325 MG per tablet   Oral   Take 2 tablets by mouth every 6 (six) hours as needed for pain.   6 tablet   0   . sulfamethoxazole-trimethoprim (BACTRIM DS,SEPTRA DS) 800-160 MG per tablet   Oral   Take 1 tablet by mouth every 12 (twelve) hours. For 7-10 days; Start date 06/02/13          BP 146/91  Pulse 85  Temp(Src) 99.3 F (37.4 C) (Oral)  Resp 19  SpO2 100% Physical Exam  Nursing note and vitals reviewed. Constitutional: He is oriented to person, place, and time. He appears well-developed and well-nourished. No distress.  HENT:  Head: Normocephalic and atraumatic.  Eyes: Conjunctivae and EOM are normal.  Neck: Normal range of motion.  Cardiovascular: Normal rate and regular rhythm.  Exam reveals no gallop and no friction rub.   No murmur heard. Pulmonary/Chest: Effort normal and breath sounds normal. He has no wheezes. He has no rales. He exhibits no tenderness.  Abdominal: Soft. He exhibits no distension. There is tenderness. There is no rebound and no guarding.  Left suprapubic area tender to palpation. No peritoneal signs.   Genitourinary: Penis normal.  Left scrotal edema and erythema that is tender to palpation. Right scrotal area unremarkable. Left inguinal area tender to palpation.   Musculoskeletal: Normal range of motion.  Neurological: He is alert and oriented to person, place, and time. Coordination  normal.  Speech is goal-oriented. Moves limbs without ataxia.   Skin: Skin is warm and dry.  Psychiatric: He has a normal mood and affect. His behavior is normal.    ED Course  Procedures (including critical care time) Labs Reviewed  URINALYSIS, ROUTINE W REFLEX MICROSCOPIC - Abnormal; Notable for the following:    Color, Urine AMBER (*)    APPearance CLOUDY (*)    Hgb urine dipstick SMALL (*)    Ketones, ur 15 (*)    Leukocytes, UA LARGE (*)    All other components within normal limits  CBC WITH DIFFERENTIAL -  Abnormal; Notable for the following:    WBC 14.5 (*)    MCH 34.8 (*)    Neutrophils Relative % 78 (*)    Neutro Abs 11.3 (*)    Lymphocytes Relative 11 (*)    Monocytes Absolute 1.4 (*)    All other components within normal limits  BASIC METABOLIC PANEL - Abnormal; Notable for the following:    GFR calc non Af Amer 86 (*)    All other components within normal limits  URINE MICROSCOPIC-ADD ON - Abnormal; Notable for the following:    Bacteria, UA FEW (*)    All other components within normal limits  URINE CULTURE   US Scrotum  06/27/2013   *RADIOLOGY REPORT*  Clinical Data: Testicle and groin pain.  No trauma.  SCROTAL ULTRASOUND DOPPLER ULTRASOUND OF THE TESTICLES  Technique:  Complete ultrasound examination of the testicles, epididymis, and other scrotal structures was performed.  Color and spectral Doppler ultrasound were also utilized to evaluate blood flow to the testicles.  Comparison:  None.  Findings:  Right testicle measures 4.4 x 2 x 2.7 cm.  It is homogeneous without mass.  The right epididymis is normal in size and vascularity.  The left testicle measures 4.1 x 2.3 x 3.7 cm.  It and the mildly enlarged epididymis (0.8 x 0.5 x 1.1 cm) are hypervascular compared to the contralateral side. There are two septated hypoechoic structures within the left epididymis, measuring 8 mm and 1 cm.There is a mild contour deformation of the capsule of the left testicle, without associated mass.  This may be from previous trauma.  Prominent veins within the spermatic cord bilaterally, which significantly enlarge with Valsalva.  Trace left hydrocele which appears simple.  IMPRESSION:  1.  Left scrotal abnormality, compatible with epidydimo-orchitis in the appropriate clinical setting. Small cystic structures within the left epididymis are likely spermatoceles; however, if the patient does not respond as expected to treatment, followup imaging recommended to reassess.  2.  Bilateral varicocele.   Original  Report Authenticated By: Tiburcio Pea   [place] Art/ven Flow Abd Pelv Doppler  06/27/2013   *RADIOLOGY REPORT*  Clinical Data: Testicle and groin pain.  No trauma.  SCROTAL ULTRASOUND DOPPLER ULTRASOUND OF THE TESTICLES  Technique:  Complete ultrasound examination of the testicles, epididymis, and other scrotal structures was performed.  Color and spectral Doppler ultrasound were also utilized to evaluate blood flow to the testicles.  Comparison:  None.  Findings:  Right testicle measures 4.4 x 2 x 2.7 cm.  It is homogeneous without mass.  The right epididymis is normal in size and vascularity.  The left testicle measures 4.1 x 2.3 x 3.7 cm.  It and the mildly enlarged epididymis (0.8 x 0.5 x 1.1 cm) are hypervascular compared to the contralateral side. There are two septated hypoechoic structures within the left epididymis, measuring 8 mm and 1 cm.There is a  mild contour deformation of the capsule of the left testicle, without associated mass.  This may be from previous trauma.  Prominent veins within the spermatic cord bilaterally, which significantly enlarge with Valsalva.  Trace left hydrocele which appears simple.  IMPRESSION:  1.  Left scrotal abnormality, compatible with epidydimo-orchitis in the appropriate clinical setting. Small cystic structures within the left epididymis are likely spermatoceles; however, if the patient does not respond as expected to treatment, followup imaging recommended to reassess.  2.  Bilateral varicocele.   Original Report Authenticated By: Tiburcio Pea   1. Epididymitis, left     MDM  11:46 AM Labs, urinalysis and US scrotum pending. Vitals stable and patient afebrile. Patient will have morphine and zofran for symptoms.   1:25 PM Patient has elevated WBC based on labs. US shows epididymitis. Patient will have IM rocephin and IV levaquin here. Patient will be discharged with levaquin prescription, pain medication and antiinflammatory medication. Patient will be  treated prophylactically for GC/Chlamydia.   Emilia Beck, PA-C 06/27/13 1536

## 2013-06-27 NOTE — ED Provider Notes (Signed)
Medical screening examination/treatment/procedure(s) were conducted as a shared visit with non-physician practitioner(s) and myself.  I personally evaluated the patient during the encounter   Shelda Jakes, MD 06/27/13 [phone]

## 2013-06-27 NOTE — ED Provider Notes (Signed)
Medical screening examination/treatment/procedure(s) were conducted as a shared visit with non-physician practitioner(s) and myself.  I personally evaluated the patient during the encounter   Results for orders placed during the hospital encounter of 06/27/13  URINALYSIS, ROUTINE W REFLEX MICROSCOPIC      Result Value Range   Color, Urine AMBER (*) YELLOW   APPearance CLOUDY (*) CLEAR   Specific Gravity, Urine 1.023  1.005 - 1.030   pH 6.0  5.0 - 8.0   Glucose, UA NEGATIVE  NEGATIVE mg/dL   Hgb urine dipstick SMALL (*) NEGATIVE   Bilirubin Urine NEGATIVE  NEGATIVE   Ketones, ur 15 (*) NEGATIVE mg/dL   Protein, ur NEGATIVE  NEGATIVE mg/dL   Urobilinogen, UA 1.0  0.0 - 1.0 mg/dL   Nitrite NEGATIVE  NEGATIVE   Leukocytes, UA LARGE (*) NEGATIVE  CBC WITH DIFFERENTIAL      Result Value Range   WBC 14.5 (*) 4.0 - 10.5 K/uL   RBC 4.34  4.22 - 5.81 MIL/uL   Hemoglobin 15.1  13.0 - 17.0 g/dL   HCT 45.4  09.8 - 11.9 %   MCV 96.5  78.0 - 100.0 fL   MCH 34.8 (*) 26.0 - 34.0 pg   MCHC 36.0  30.0 - 36.0 g/dL   RDW 14.7  82.9 - 56.2 %   Platelets 199  150 - 400 K/uL   Neutrophils Relative % 78 (*) 43 - 77 %   Neutro Abs 11.3 (*) 1.7 - 7.7 K/uL   Lymphocytes Relative 11 (*) 12 - 46 %   Lymphs Abs 1.7  0.7 - 4.0 K/uL   Monocytes Relative 10  3 - 12 %   Monocytes Absolute 1.4 (*) 0.1 - 1.0 K/uL   Eosinophils Relative 1  0 - 5 %   Eosinophils Absolute 0.1  0.0 - 0.7 K/uL   Basophils Relative 0  0 - 1 %   Basophils Absolute 0.0  0.0 - 0.1 K/uL  BASIC METABOLIC PANEL      Result Value Range   Sodium 137  135 - 145 mEq/L   Potassium 3.6  3.5 - 5.1 mEq/L   Chloride 100  96 - 112 mEq/L   CO2 22  19 - 32 mEq/L   Glucose, Bld 98  70 - 99 mg/dL   BUN 14  6 - 23 mg/dL   Creatinine, Ser 1.30  0.50 - 1.35 mg/dL   Calcium 9.4  8.4 - 86.5 mg/dL   GFR calc non Af Amer 86 (*) >90 mL/min   GFR calc Af Amer >90  >90 mL/min  URINE MICROSCOPIC-ADD ON      Result Value Range   Squamous Epithelial / LPF  RARE  RARE   WBC, UA TOO NUMEROUS TO COUNT  <3 WBC/hpf   RBC / HPF 3-6  <3 RBC/hpf   Bacteria, UA FEW (*) RARE   Urine-Other MUCOUS PRESENT     US Scrotum  06/27/2013   *RADIOLOGY REPORT*  Clinical Data: Testicle and groin pain.  No trauma.  SCROTAL ULTRASOUND DOPPLER ULTRASOUND OF THE TESTICLES  Technique:  Complete ultrasound examination of the testicles, epididymis, and other scrotal structures was performed.  Color and spectral Doppler ultrasound were also utilized to evaluate blood flow to the testicles.  Comparison:  None.  Findings:  Right testicle measures 4.4 x 2 x 2.7 cm.  It is homogeneous without mass.  The right epididymis is normal in size and vascularity.  The left testicle measures 4.1 x 2.3 x  3.7 cm.  It and the mildly enlarged epididymis (0.8 x 0.5 x 1.1 cm) are hypervascular compared to the contralateral side. There are two septated hypoechoic structures within the left epididymis, measuring 8 mm and 1 cm.There is a mild contour deformation of the capsule of the left testicle, without associated mass.  This may be from previous trauma.  Prominent veins within the spermatic cord bilaterally, which significantly enlarge with Valsalva.  Trace left hydrocele which appears simple.  IMPRESSION:  1.  Left scrotal abnormality, compatible with epidydimo-orchitis in the appropriate clinical setting. Small cystic structures within the left epididymis are likely spermatoceles; however, if the patient does not respond as expected to treatment, followup imaging recommended to reassess.  2.  Bilateral varicocele.   Original Report Authenticated By: Tiburcio Pea   [place] Art/ven Flow Abd Pelv Doppler  06/27/2013   *RADIOLOGY REPORT*  Clinical Data: Testicle and groin pain.  No trauma.  SCROTAL ULTRASOUND DOPPLER ULTRASOUND OF THE TESTICLES  Technique:  Complete ultrasound examination of the testicles, epididymis, and other scrotal structures was performed.  Color and spectral Doppler ultrasound were also  utilized to evaluate blood flow to the testicles.  Comparison:  None.  Findings:  Right testicle measures 4.4 x 2 x 2.7 cm.  It is homogeneous without mass.  The right epididymis is normal in size and vascularity.  The left testicle measures 4.1 x 2.3 x 3.7 cm.  It and the mildly enlarged epididymis (0.8 x 0.5 x 1.1 cm) are hypervascular compared to the contralateral side. There are two septated hypoechoic structures within the left epididymis, measuring 8 mm and 1 cm.There is a mild contour deformation of the capsule of the left testicle, without associated mass.  This may be from previous trauma.  Prominent veins within the spermatic cord bilaterally, which significantly enlarge with Valsalva.  Trace left hydrocele which appears simple.  IMPRESSION:  1.  Left scrotal abnormality, compatible with epidydimo-orchitis in the appropriate clinical setting. Small cystic structures within the left epididymis are likely spermatoceles; however, if the patient does not respond as expected to treatment, followup imaging recommended to reassess.  2.  Bilateral varicocele.   Original Report Authenticated By: Tiburcio Pea    Physical examination ultrasound and urinalysis results are consistent with left-sided epididymitis with orchitis. Will treat the epididymitis with antibiotics with concern for STD -type bugs. Scrotal support anti-inflammatories and pain medicine and a work note for 4 days.   Shelda Jakes, MD 06/27/13 1330

## 2013-06-27 NOTE — ED Notes (Signed)
Pt returned from radiology.

## 2013-06-28 LAB — URINE CULTURE
Colony Count: NO GROWTH
Culture: NO GROWTH
Special Requests: NORMAL

## 2013-12-13 ENCOUNTER — Encounter (HOSPITAL_COMMUNITY): Payer: Self-pay | Admitting: Emergency Medicine

## 2013-12-13 ENCOUNTER — Emergency Department (HOSPITAL_COMMUNITY)
Admission: EM | Admit: 2013-12-13 | Discharge: 2013-12-13 | Disposition: A | Discharge status: Home / Self Care | Payer: Self-pay | Attending: Emergency Medicine | Admitting: Emergency Medicine

## 2013-12-13 DIAGNOSIS — K089 Disorder of teeth and supporting structures, unspecified: Secondary | ICD-10-CM | POA: Insufficient documentation

## 2013-12-13 DIAGNOSIS — F17200 Nicotine dependence, unspecified, uncomplicated: Secondary | ICD-10-CM | POA: Insufficient documentation

## 2013-12-13 DIAGNOSIS — I10 Essential (primary) hypertension: Secondary | ICD-10-CM | POA: Insufficient documentation

## 2013-12-13 DIAGNOSIS — K0889 Other specified disorders of teeth and supporting structures: Secondary | ICD-10-CM

## 2013-12-13 DIAGNOSIS — R011 Cardiac murmur, unspecified: Secondary | ICD-10-CM | POA: Insufficient documentation

## 2013-12-13 DIAGNOSIS — R51 Headache: Secondary | ICD-10-CM | POA: Insufficient documentation

## 2013-12-13 DIAGNOSIS — Z8719 Personal history of other diseases of the digestive system: Secondary | ICD-10-CM | POA: Insufficient documentation

## 2013-12-13 MED ORDER — HYDROCODONE-ACETAMINOPHEN 5-325 MG PO TABS
1.0000 | ORAL_TABLET | Freq: Once | ORAL | Status: AC
  Administered 2013-12-13: 1 via ORAL
  Filled 2013-12-13: qty 1

## 2013-12-13 MED ORDER — HYDROCODONE-ACETAMINOPHEN 5-325 MG PO TABS: 2.0000 | ORAL_TABLET | ORAL | Status: DC | PRN

## 2013-12-13 NOTE — ED Provider Notes (Signed)
CSN: [id number]     Arrival date & time 12/13/13  0450 History   First MD Initiated Contact with Patient 12/13/13 0813     Chief Complaint  Patient presents with  . Dental Pain   (Consider location/radiation/quality/duration/timing/severity/associated sxs/prior Treatment) HPI Comments: Patient presents with a two-week history of right upper molar dental pain unrelieved by ibuprofen and Aleve. Denies any difficulty breathing or difficulty swallowing. No fevers or vomiting. No chest pain or shortness of breath. No visual changes. 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 He is not on blood pressure medication. He is not have a Dr.  The history is provided by the patient.    Past Medical History  Diagnosis Date  . Diverticulitis   . Heart murmur    Past Surgical History  Procedure Laterality Date  . Achilles tendon repair  06/2010    left; S/P tendon rupture   No family history on file. History  Substance Use Topics  . Smoking status: Current Every Day Smoker -- 1.00 packs/day for 18 years    Types: Cigarettes  . Smokeless tobacco: Never Used  . Alcohol Use: 3.6 oz/week    6 Cans of beer per week    Review of Systems  Constitutional: Negative for fever, activity change, appetite change and fatigue.  HENT: Positive for dental problem.   Eyes: Negative for photophobia and visual disturbance.  Respiratory: Negative for cough, chest tightness and shortness of breath.   Cardiovascular: Negative for chest pain.  Gastrointestinal: Negative for nausea, vomiting and abdominal pain.  Genitourinary: Negative for dysuria and hematuria.  Musculoskeletal: Negative for back pain.  Neurological: Positive for headaches. Negative for dizziness and weakness.  A complete 10 system review of systems was obtained and all systems are negative except as noted in the HPI and PMH.    Allergies  Review of patient's allergies indicates no known  allergies.  Home Medications   Current Outpatient Rx  Name  Route  Sig  Dispense  Refill  . HYDROcodone-acetaminophen (NORCO/VICODIN) 5-325 MG per tablet   Oral   Take 2 tablets by mouth every 4 (four) hours as needed.   10 tablet   0    BP 174/108  Pulse 85  Temp(Src) 97.9 F (36.6 C) (Oral)  Resp 15  Ht 5\' 4"  (1.626 m)  Wt 204 lb (92.534 kg)  BMI 35.00 kg/m2  SpO2 96% Physical Exam  Constitutional: He is oriented to person, place, and time. He appears well-developed and well-nourished. No distress.  HENT:  Head: Normocephalic and atraumatic.  Mouth/Throat: Oropharynx is clear and moist. No oropharyngeal exudate.  TTP R upper 2nd and 3rd molar. No abscess, floor of mouth soft.  Eyes: Conjunctivae and EOM are normal. Pupils are equal, round, and reactive to light.  Neck: Normal range of motion. Neck supple.  No meningismus  Cardiovascular: Normal rate, regular rhythm and normal heart sounds.   No murmur heard. Pulmonary/Chest: Effort normal and breath sounds normal. No respiratory distress.  Abdominal: Soft. There is no tenderness. There is no rebound and no guarding.  Musculoskeletal: Normal range of motion. He exhibits no edema and no tenderness.  Neurological: He is alert and oriented to person, place, and time. No cranial nerve deficit. He exhibits normal muscle tone. Coordination normal.  CN 2-12 intact, no ataxia on finger to nose, no nystagmus, 5/5 strength throughout, no pronator drift, Romberg negative, normal gait.   Skin: Skin is warm.  ED Course  Procedures (including critical care time) Labs Review Labs Reviewed - No data to display Imaging Review No results found.  EKG Interpretation   None       MDM   1. Pain, dental   2. Hypertension    Two-week history of upper molar dental pain without difficulty breathing or swallowing. No chest pain or shortness of breath.  Hypertensive in the ED. Neurological exam normal. States history of  hypertension not on medications. No headache at this time. No vision change. Has had some gradual onset headaches at home.  Neurologically intact.  Referral to dentistry and PCP.    Glynn Octave, MD 12/13/13 [phone]

## 2013-12-13 NOTE — ED Notes (Signed)
Pt. reports right upper molar pain for 2 weeks unrelieved by OTC Ibuprofen / Aleve .

## 2013-12-13 NOTE — ED Notes (Signed)
Pt reporting his grandma is coming to get him. EDP made aware. Pt originally reporting he drove here. Asking for pain medicine.

## 2013-12-22 ENCOUNTER — Inpatient Hospital Stay: Payer: Self-pay

## 2014-01-08 ENCOUNTER — Ambulatory Visit: Payer: Self-pay | Admitting: Internal Medicine

## 2015-06-12 ENCOUNTER — Encounter (HOSPITAL_COMMUNITY): Payer: Self-pay | Admitting: *Deleted

## 2015-06-12 ENCOUNTER — Inpatient Hospital Stay (HOSPITAL_COMMUNITY)
Admission: EM | Admit: 2015-06-12 | Discharge: 2015-06-14 | DRG: 392 | Disposition: A | Discharge status: Home / Self Care | Payer: Commercial Managed Care - HMO | Attending: Internal Medicine | Admitting: Internal Medicine

## 2015-06-12 ENCOUNTER — Emergency Department (HOSPITAL_COMMUNITY): Payer: Commercial Managed Care - HMO

## 2015-06-12 DIAGNOSIS — F17210 Nicotine dependence, cigarettes, uncomplicated: Secondary | ICD-10-CM | POA: Diagnosis present

## 2015-06-12 DIAGNOSIS — Z79899 Other long term (current) drug therapy: Secondary | ICD-10-CM

## 2015-06-12 DIAGNOSIS — K5792 Diverticulitis of intestine, part unspecified, without perforation or abscess without bleeding: Secondary | ICD-10-CM | POA: Diagnosis present

## 2015-06-12 DIAGNOSIS — K5732 Diverticulitis of large intestine without perforation or abscess without bleeding: Secondary | ICD-10-CM

## 2015-06-12 DIAGNOSIS — I10 Essential (primary) hypertension: Secondary | ICD-10-CM | POA: Diagnosis present

## 2015-06-12 DIAGNOSIS — F17200 Nicotine dependence, unspecified, uncomplicated: Secondary | ICD-10-CM | POA: Diagnosis present

## 2015-06-12 DIAGNOSIS — R011 Cardiac murmur, unspecified: Secondary | ICD-10-CM | POA: Diagnosis present

## 2015-06-12 HISTORY — DX: Essential (primary) hypertension: I10

## 2015-06-12 LAB — COMPREHENSIVE METABOLIC PANEL
ALK PHOS: 73 U/L (ref 38–126)
ALT: 42 U/L (ref 17–63)
AST: 28 U/L (ref 15–41)
Albumin: 4.1 g/dL (ref 3.5–5.0)
Anion gap: 10 (ref 5–15)
BILIRUBIN TOTAL: 0.8 mg/dL (ref 0.3–1.2)
BUN: 15 mg/dL (ref 6–20)
CALCIUM: 9.4 mg/dL (ref 8.9–10.3)
CHLORIDE: 100 mmol/L — AB (ref 101–111)
CO2: 26 mmol/L (ref 22–32)
Creatinine, Ser: 1.29 mg/dL — ABNORMAL HIGH (ref 0.61–1.24)
GLUCOSE: 101 mg/dL — AB (ref 65–99)
POTASSIUM: 4.1 mmol/L (ref 3.5–5.1)
SODIUM: 136 mmol/L (ref 135–145)
TOTAL PROTEIN: 8.2 g/dL — AB (ref 6.5–8.1)

## 2015-06-12 LAB — CBC WITH DIFFERENTIAL/PLATELET
Basophils Absolute: 0 10*3/uL (ref 0.0–0.1)
Basophils Relative: 0 % (ref 0–1)
EOS ABS: 0.1 10*3/uL (ref 0.0–0.7)
EOS PCT: 1 % (ref 0–5)
HCT: 41.1 % (ref 39.0–52.0)
Hemoglobin: 14.4 g/dL (ref 13.0–17.0)
LYMPHS ABS: 2.2 10*3/uL (ref 0.7–4.0)
LYMPHS PCT: 19 % (ref 12–46)
MCH: 34 pg (ref 26.0–34.0)
MCHC: 35 g/dL (ref 30.0–36.0)
MCV: 96.9 fL (ref 78.0–100.0)
MONO ABS: 1 10*3/uL (ref 0.1–1.0)
Monocytes Relative: 8 % (ref 3–12)
Neutro Abs: 8.2 10*3/uL — ABNORMAL HIGH (ref 1.7–7.7)
Neutrophils Relative %: 72 % (ref 43–77)
PLATELETS: 227 10*3/uL (ref 150–400)
RBC: 4.24 MIL/uL (ref 4.22–5.81)
RDW: 11.8 % (ref 11.5–15.5)
WBC: 11.5 10*3/uL — AB (ref 4.0–10.5)

## 2015-06-12 LAB — URINALYSIS, ROUTINE W REFLEX MICROSCOPIC
Bilirubin Urine: NEGATIVE
GLUCOSE, UA: NEGATIVE mg/dL
HGB URINE DIPSTICK: NEGATIVE
KETONES UR: NEGATIVE mg/dL
LEUKOCYTES UA: NEGATIVE
Nitrite: NEGATIVE
PROTEIN: NEGATIVE mg/dL
SPECIFIC GRAVITY, URINE: 1.019 (ref 1.005–1.030)
Urobilinogen, UA: 1 mg/dL (ref 0.0–1.0)
pH: 5 (ref 5.0–8.0)

## 2015-06-12 LAB — LIPASE, BLOOD: Lipase: 20 U/L — ABNORMAL LOW (ref 22–51)

## 2015-06-12 MED ORDER — MORPHINE SULFATE 4 MG/ML IJ SOLN
4.0000 mg | Freq: Once | INTRAMUSCULAR | Status: AC
  Administered 2015-06-12: 4 mg via INTRAVENOUS
  Filled 2015-06-12: qty 1

## 2015-06-12 MED ORDER — IOHEXOL 300 MG/ML  SOLN
100.0000 mL | Freq: Once | INTRAMUSCULAR | Status: AC | PRN
  Administered 2015-06-12: 100 mL via INTRAVENOUS

## 2015-06-12 MED ORDER — ONDANSETRON HCL 4 MG/2ML IJ SOLN
4.0000 mg | Freq: Once | INTRAMUSCULAR | Status: AC
  Administered 2015-06-12: 4 mg via INTRAVENOUS
  Filled 2015-06-12: qty 2

## 2015-06-12 MED ORDER — SODIUM CHLORIDE 0.9 % IV BOLUS (SEPSIS)
1000.0000 mL | Freq: Once | INTRAVENOUS | Status: AC
  Administered 2015-06-12: 1000 mL via INTRAVENOUS

## 2015-06-12 MED ORDER — IOHEXOL 300 MG/ML  SOLN
25.0000 mL | Freq: Once | INTRAMUSCULAR | Status: AC | PRN
  Administered 2015-06-12: 25 mL via ORAL

## 2015-06-12 NOTE — ED Provider Notes (Signed)
CSN: [id number]     Arrival date & time 06/12/15  2050 History   First MD Initiated Contact with Patient 06/12/15 2152     Chief Complaint  Patient presents with  . Abdominal Pain     (Consider location/radiation/quality/duration/timing/severity/associated sxs/prior Treatment) HPI Comments: Patient is a 39 year old male with a past medical history of hypertension and diverticulitis who presents with abdominal pain that started earlier today. The pain is located in the LLQ and RLQ and does not radiate. The pain is described as cramping and severe. The pain started gradually and progressively worsened since the onset. No alleviating/aggravating factors. The patient has tried nothing for symptoms without relief. Associated symptoms include nothing. Patient denies fever, headache, NVD, chest pain, SOB, dysuria, constipation. No previous abdominal surgery.    Past Medical History  Diagnosis Date  . Diverticulitis   . Heart murmur   . Hypertension    Past Surgical History  Procedure Laterality Date  . Achilles tendon repair  06/2010    left; S/P tendon rupture   No family history on file. History  Substance Use Topics  . Smoking status: Current Every Day Smoker -- 1.00 packs/day for 18 years    Types: Cigarettes  . Smokeless tobacco: Never Used  . Alcohol Use: 3.6 oz/week    6 Cans of beer per week    Review of Systems  Constitutional: Negative for fever, chills and fatigue.  HENT: Negative for trouble swallowing.   Eyes: Negative for visual disturbance.  Respiratory: Negative for shortness of breath.   Cardiovascular: Negative for chest pain and palpitations.  Gastrointestinal: Positive for abdominal pain. Negative for nausea, vomiting and diarrhea.  Genitourinary: Negative for dysuria and difficulty urinating.  Musculoskeletal: Negative for arthralgias and neck pain.  Skin: Negative for color change.  Neurological: Negative for dizziness and weakness.  Psychiatric/Behavioral:  Negative for dysphoric mood.      Allergies  Review of patient's allergies indicates no known allergies.  Home Medications   Prior to Admission medications   Medication Sig Start Date End Date Taking? Authorizing Provider  lisinopril-hydrochlorothiazide (PRINZIDE,ZESTORETIC) 10-12.5 MG per tablet Take 1 tablet by mouth daily.   Yes Historical Provider, MD  HYDROcodone-acetaminophen (NORCO/VICODIN) 5-325 MG per tablet Take 2 tablets by mouth every 4 (four) hours as needed. 12/13/13   Glynn Octave, MD   BP 136/94 mmHg  Pulse 98  Temp(Src) 98.8 F (37.1 C) (Oral)  Resp 14  Ht 5' 5.5" (1.664 m)  Wt 194 lb 4 oz (88.111 kg)  BMI 31.82 kg/m2  SpO2 97% Physical Exam  Constitutional: He is oriented to person, place, and time. He appears well-developed and well-nourished. No distress.  HENT:  Head: Normocephalic and atraumatic.  Eyes: Conjunctivae and EOM are normal.  Neck: Normal range of motion.  Cardiovascular: Normal rate and regular rhythm.  Exam reveals no gallop and no friction rub.   No murmur heard. Pulmonary/Chest: Effort normal and breath sounds normal. He has no wheezes. He has no rales. He exhibits no tenderness.  Abdominal: Soft. He exhibits no distension. There is tenderness. There is no rebound.  Tenderness to palpation in RLQ most notable. Also LLQ tenderness to palpation. No peritoneal signs or rebound tenderness.   Musculoskeletal: Normal range of motion.  Neurological: He is alert and oriented to person, place, and time. Coordination normal.  Speech is goal-oriented. Moves limbs without ataxia.   Skin: Skin is warm and dry.  Psychiatric: He has a normal mood and affect. His behavior is  normal.  Nursing note and vitals reviewed.   ED Course  Procedures (including critical care time) Labs Review Labs Reviewed  CBC WITH DIFFERENTIAL/PLATELET - Abnormal; Notable for the following:    WBC 11.5 (*)    Neutro Abs 8.2 (*)    All other components within normal  limits  COMPREHENSIVE METABOLIC PANEL - Abnormal; Notable for the following:    Chloride 100 (*)    Glucose, Bld 101 (*)    Creatinine, Ser 1.29 (*)    Total Protein 8.2 (*)    All other components within normal limits  LIPASE, BLOOD - Abnormal; Notable for the following:    Lipase 20 (*)    All other components within normal limits  URINALYSIS, ROUTINE W REFLEX MICROSCOPIC (NOT AT [facility])    Imaging Review Ct Abdomen Pelvis W Contrast  06/13/2015   CLINICAL DATA:  Acute onset of right lower quadrant abdominal pain and nausea. Initial encounter.  EXAM: CT ABDOMEN AND PELVIS WITH CONTRAST  TECHNIQUE: Multidetector CT imaging of the abdomen and pelvis was performed using the standard protocol following bolus administration of intravenous contrast.  CONTRAST:  OMNIPAQUE IOHEXOL 300 MG/ML  SOLN  COMPARISON:  CT of the abdomen and pelvis performed 11/22/2011  FINDINGS: The visualized lung bases are clear. Minimal calcification is noted at the aortic valve.  The liver and spleen are unremarkable in appearance. The gallbladder is within normal limits. The pancreas and adrenal glands are unremarkable.  The kidneys are unremarkable in appearance. There is no evidence of hydronephrosis. No renal or ureteral stones are seen. Nonspecific perinephric stranding is noted bilaterally.  No free fluid is identified. The small bowel is unremarkable in appearance. The stomach is within normal limits. No acute vascular abnormalities are seen. Minimal calcification is noted along the distal abdominal aorta and its branches.  The appendix is long and unremarkable in appearance, containing air. There is no evidence of appendicitis.  Scattered diverticulosis is noted along the hepatic flexure of the colon, and along the descending and sigmoid colon.  There is marked focal wall thickening at the proximal to mid sigmoid colon, with diffuse surrounding soft tissue inflammation and inflamed diverticula. This likely reflects  acute diverticulitis, though after completion of treatment for diverticulitis, would perform follow-up colonoscopy to exclude an underlying mass. There is no evidence of perforation or abscess formation at this time.  The bladder is mildly distended and grossly unremarkable in appearance. The prostate remains normal in size. No inguinal lymphadenopathy is seen.  No acute osseous abnormalities are identified.  IMPRESSION: 1. Marked focal wall thickening at the proximal to mid sigmoid colon, with diffuse surrounding soft tissue inflammation and inflamed diverticula. This likely reflects acute diverticulitis, though would perform follow-up colonoscopy after completion of treatment for diverticulitis, to exclude an underlying mass. No evidence of perforation or abscess formation at this time. 2. Scattered diverticulosis along the hepatic flexure of the colon, and along the descending and sigmoid colon.   Electronically Signed   By: Roanna Raider M.D.   On: 06/13/2015 00:20     EKG Interpretation None      MDM   Final diagnoses:  Diverticulitis of large intestine without perforation or abscess without bleeding    10:28 PM Patient's labs show elevated WBC at 11.5 with remaining values unremarkable. Vitals stable and patient afebrile. Patient's most recent temp is 99.9. Patient will have CT abdomen pelvis to rule out appendicitis.   3:25 AM Patient's CT shows diverticulitis of sigmoid colon with  abscess or perforation. Patient's pain not well controlled in the ED and will be admitted for pain control by Dr. Allena Katz.    [address] [place], PA-C 06/13/15 3474  Elwin Mocha, MD 06/13/15 [phone]

## 2015-06-12 NOTE — ED Notes (Signed)
The pt is c/o abd pain no nv or diarrhea.  History of diverticulitis.  Pain today

## 2015-06-13 ENCOUNTER — Encounter (HOSPITAL_COMMUNITY): Payer: Self-pay | Admitting: Internal Medicine

## 2015-06-13 DIAGNOSIS — Z720 Tobacco use: Secondary | ICD-10-CM | POA: Diagnosis not present

## 2015-06-13 DIAGNOSIS — Z79899 Other long term (current) drug therapy: Secondary | ICD-10-CM | POA: Diagnosis not present

## 2015-06-13 DIAGNOSIS — R011 Cardiac murmur, unspecified: Secondary | ICD-10-CM | POA: Diagnosis present

## 2015-06-13 DIAGNOSIS — K5792 Diverticulitis of intestine, part unspecified, without perforation or abscess without bleeding: Secondary | ICD-10-CM | POA: Diagnosis present

## 2015-06-13 DIAGNOSIS — I10 Essential (primary) hypertension: Secondary | ICD-10-CM | POA: Diagnosis present

## 2015-06-13 DIAGNOSIS — F17210 Nicotine dependence, cigarettes, uncomplicated: Secondary | ICD-10-CM | POA: Diagnosis present

## 2015-06-13 DIAGNOSIS — K5732 Diverticulitis of large intestine without perforation or abscess without bleeding: Secondary | ICD-10-CM | POA: Diagnosis present

## 2015-06-13 LAB — SEDIMENTATION RATE: Sed Rate: 22 mm/hr — ABNORMAL HIGH (ref 0–16)

## 2015-06-13 LAB — C-REACTIVE PROTEIN: CRP: 6.4 mg/dL — AB (ref <1.0)

## 2015-06-13 MED ORDER — HYDROMORPHONE HCL 1 MG/ML IJ SOLN
1.0000 mg | Freq: Once | INTRAMUSCULAR | Status: AC
  Administered 2015-06-13: 1 mg via INTRAVENOUS
  Filled 2015-06-13: qty 1

## 2015-06-13 MED ORDER — CIPROFLOXACIN IN D5W 400 MG/200ML IV SOLN
400.0000 mg | Freq: Two times a day (BID) | INTRAVENOUS | Status: DC
  Administered 2015-06-13 (×2): 400 mg via INTRAVENOUS
  Filled 2015-06-13 (×4): qty 200

## 2015-06-13 MED ORDER — ONDANSETRON HCL 4 MG/2ML IJ SOLN
4.0000 mg | Freq: Three times a day (TID) | INTRAMUSCULAR | Status: DC | PRN
  Administered 2015-06-13: 4 mg via INTRAVENOUS
  Filled 2015-06-13: qty 2

## 2015-06-13 MED ORDER — HYDROMORPHONE HCL 1 MG/ML IJ SOLN: 1.0000 mg | INTRAMUSCULAR | Status: DC | PRN

## 2015-06-13 MED ORDER — ONDANSETRON HCL 4 MG/2ML IJ SOLN: 4.0000 mg | Freq: Four times a day (QID) | INTRAMUSCULAR | Status: DC | PRN

## 2015-06-13 MED ORDER — ONDANSETRON HCL 4 MG PO TABS: 4.0000 mg | ORAL_TABLET | Freq: Four times a day (QID) | ORAL | Status: DC | PRN

## 2015-06-13 MED ORDER — METRONIDAZOLE 500 MG PO TABS
500.0000 mg | ORAL_TABLET | Freq: Once | ORAL | Status: AC
  Administered 2015-06-13: 500 mg via ORAL
  Filled 2015-06-13: qty 1

## 2015-06-13 MED ORDER — SODIUM CHLORIDE 0.9 % IV SOLN
INTRAVENOUS | Status: DC
  Administered 2015-06-13: 100 mL/h via INTRAVENOUS
  Administered 2015-06-13: 17:00:00 via INTRAVENOUS
  Administered 2015-06-14: 100 mL/h via INTRAVENOUS

## 2015-06-13 MED ORDER — KETOROLAC TROMETHAMINE 15 MG/ML IJ SOLN
15.0000 mg | Freq: Four times a day (QID) | INTRAMUSCULAR | Status: DC
  Administered 2015-06-13 – 2015-06-14 (×6): 15 mg via INTRAVENOUS
  Filled 2015-06-13 (×9): qty 1

## 2015-06-13 MED ORDER — METRONIDAZOLE IN NACL 5-0.79 MG/ML-% IV SOLN
500.0000 mg | Freq: Three times a day (TID) | INTRAVENOUS | Status: DC
  Administered 2015-06-13 – 2015-06-14 (×3): 500 mg via INTRAVENOUS
  Filled 2015-06-13 (×6): qty 100

## 2015-06-13 MED ORDER — ACETAMINOPHEN 325 MG PO TABS: 650.0000 mg | ORAL_TABLET | Freq: Four times a day (QID) | ORAL | Status: DC | PRN

## 2015-06-13 MED ORDER — CIPROFLOXACIN HCL 500 MG PO TABS
500.0000 mg | ORAL_TABLET | Freq: Once | ORAL | Status: AC
  Administered 2015-06-13: 500 mg via ORAL
  Filled 2015-06-13: qty 1

## 2015-06-13 MED ORDER — ACETAMINOPHEN 650 MG RE SUPP: 650.0000 mg | Freq: Four times a day (QID) | RECTAL | Status: DC | PRN

## 2015-06-13 MED ORDER — HYDROCODONE-ACETAMINOPHEN 5-325 MG PO TABS: 1.0000 | ORAL_TABLET | ORAL | Status: DC | PRN

## 2015-06-13 MED ORDER — HEPARIN SODIUM (PORCINE) 5000 UNIT/ML IJ SOLN
5000.0000 [IU] | Freq: Three times a day (TID) | INTRAMUSCULAR | Status: DC
  Administered 2015-06-13 – 2015-06-14 (×4): 5000 [IU] via SUBCUTANEOUS
  Filled 2015-06-13 (×7): qty 1

## 2015-06-13 NOTE — Progress Notes (Signed)
Report taken from ED RN Arlys John. Room ready for admission.

## 2015-06-13 NOTE — Progress Notes (Signed)
New Admission Note:   Arrival Method: per stretcher from ED with NT [place] Mental Orientation: alert, oriented X4 Telemetry: none ordered Assessment: Completed Skin: warm, dry, intact, no wounds noted IV: G20 on the right FA with transparent dressing, clean, dry and intact Pain: 5/10 scale on the right lower abdomen. MD paged for admitting orders Tubes: none Safety Measures: Safety Fall Prevention Plan has been given, discussed and signed Admission: Completed [address] Orientation: Patient has been orientated to the room, unit and staff.  Family: no family member noted at bedside.  Orders have been reviewed and implemented. Will continue to monitor the patient. Call light has been placed within reach and bed alarm has been activated.   Janice Norrie BSN, RN [facility] [address] [phone]

## 2015-06-13 NOTE — Progress Notes (Signed)
[facility] PROGRESS NOTE   JEOVANNY KELDERMAN CLE:[id number] DOB: 27-Jun-1976 DOA: 06/12/2015 PCP: No PCP Per Patient  HPI/Subjective: States he is hungry and wants to eat  Assessment/Plan: Principal Problem:   Diverticulitis Active Problems:   TOBACCO USER   This is a no charge note. Patient seen earlier today by my colleague Dr. Allena Katz. Has history of diverticulitis and colonic perforation and abscess formation. Came in to the hospital complaining about RLQ abdominal pain, CT is consistent with acute diverticulitis. Started on Cipro and Flagyl, was nothing by mouth we'll place on clear liquids as he is requesting diet.  Code Status: Full Code Family Communication: Plan discussed with the patient. Disposition Plan: Remains inpatient Diet: Diet clear liquid Room service appropriate?: Yes; Fluid consistency:: Thin  Consultants:  None  Procedures:  *None  Antibiotics:  Ciprofloxacin, metronidazole   Objective: Filed Vitals:   06/13/15 0426  BP: 108/63  Pulse: 81  Temp: 98.6 F (37 C)  Resp: 19    Intake/Output Summary (Last 24 hours) at 06/13/15 1320 Last data filed at 06/13/15 0600  Gross per 24 hour  Intake     30 ml  Output    275 ml  Net   -245 ml   Filed Weights   06/12/15 2058 06/13/15 0426  Weight: 88.111 kg (194 lb 4 oz) 82.827 kg (182 lb 9.6 oz)    Exam: General: Alert and awake, oriented x3, not in any acute distress. HEENT: anicteric sclera, pupils reactive to light and accommodation, EOMI CVS: S1-S2 clear, no murmur rubs or gallops Chest: clear to auscultation bilaterally, no wheezing, rales or rhonchi Abdomen: soft nontender, nondistended, normal bowel sounds, no organomegaly Extremities: no cyanosis, clubbing or edema noted bilaterally Neuro: Cranial nerves II-XII intact, no focal neurological deficits  Data Reviewed: Basic Metabolic Panel:  Recent Labs Lab 06/12/15 2102  NA 136  K 4.1  CL 100*  CO2 26  GLUCOSE 101*  BUN  15  CREATININE 1.29*  CALCIUM 9.4   Liver Function Tests:  Recent Labs Lab 06/12/15 2102  AST 28  ALT 42  ALKPHOS 73  BILITOT 0.8  PROT 8.2*  ALBUMIN 4.1    Recent Labs Lab 06/12/15 2102  LIPASE 20*   No results for input(s): AMMONIA in the last 168 hours. CBC:  Recent Labs Lab 06/12/15 2102  WBC 11.5*  NEUTROABS 8.2*  HGB 14.4  HCT 41.1  MCV 96.9  PLT 227   Cardiac Enzymes: No results for input(s): CKTOTAL, CKMB, CKMBINDEX, TROPONINI in the last 168 hours. BNP (last 3 results) No results for input(s): BNP in the last 8760 hours.  ProBNP (last 3 results) No results for input(s): PROBNP in the last 8760 hours.  CBG: No results for input(s): GLUCAP in the last 168 hours.  Micro No results found for this or any previous visit (from the past 240 hour(s)).   Studies: Ct Abdomen Pelvis W Contrast  06/13/2015   CLINICAL DATA:  Acute onset of right lower quadrant abdominal pain and nausea. Initial encounter.  EXAM: CT ABDOMEN AND PELVIS WITH CONTRAST  TECHNIQUE: Multidetector CT imaging of the abdomen and pelvis was performed using the standard protocol following bolus administration of intravenous contrast.  CONTRAST:  OMNIPAQUE IOHEXOL 300 MG/ML  SOLN  COMPARISON:  CT of the abdomen and pelvis performed 11/22/2011  FINDINGS: The visualized lung bases are clear. Minimal calcification is noted at the aortic valve.  The liver and spleen are unremarkable in appearance. The gallbladder is within normal  limits. The pancreas and adrenal glands are unremarkable.  The kidneys are unremarkable in appearance. There is no evidence of hydronephrosis. No renal or ureteral stones are seen. Nonspecific perinephric stranding is noted bilaterally.  No free fluid is identified. The small bowel is unremarkable in appearance. The stomach is within normal limits. No acute vascular abnormalities are seen. Minimal calcification is noted along the distal abdominal aorta and its branches.   The appendix is long and unremarkable in appearance, containing air. There is no evidence of appendicitis.  Scattered diverticulosis is noted along the hepatic flexure of the colon, and along the descending and sigmoid colon.  There is marked focal wall thickening at the proximal to mid sigmoid colon, with diffuse surrounding soft tissue inflammation and inflamed diverticula. This likely reflects acute diverticulitis, though after completion of treatment for diverticulitis, would perform follow-up colonoscopy to exclude an underlying mass. There is no evidence of perforation or abscess formation at this time.  The bladder is mildly distended and grossly unremarkable in appearance. The prostate remains normal in size. No inguinal lymphadenopathy is seen.  No acute osseous abnormalities are identified.  IMPRESSION: 1. Marked focal wall thickening at the proximal to mid sigmoid colon, with diffuse surrounding soft tissue inflammation and inflamed diverticula. This likely reflects acute diverticulitis, though would perform follow-up colonoscopy after completion of treatment for diverticulitis, to exclude an underlying mass. No evidence of perforation or abscess formation at this time. 2. Scattered diverticulosis along the hepatic flexure of the colon, and along the descending and sigmoid colon.   Electronically Signed   By: Roanna Raider M.D.   On: 06/13/2015 00:20    Scheduled Meds: . ciprofloxacin  400 mg Intravenous Q12H  . heparin  5,000 Units Subcutaneous 3 times per day  . ketorolac  15 mg Intravenous 4 times per day  . metronidazole  500 mg Intravenous Q8H   Continuous Infusions: . sodium chloride 100 mL/hr (06/13/15 0542)       Time spent: 35 minutes    [facility] A  [facility] Pager [phone] If 7PM-7AM, please contact night-coverage at [email or web address], password [facility] 06/13/2015, 1:20 PM

## 2015-06-13 NOTE — Progress Notes (Signed)
ANTIBIOTIC CONSULT NOTE - INITIAL  Pharmacy Consult for Cipro Indication: Intra-abdominal infection  No Known Allergies  Patient Measurements: Height: 5' 5.5" (166.4 cm) Weight: 182 lb 9.6 oz (82.827 kg) IBW/kg (Calculated) : 62.65  Vital Signs: Temp: 98.6 F (37 C) (06/26 0426) Temp Source: Oral (06/26 0426) BP: 108/63 mmHg (06/26 0426) Pulse Rate: 81 (06/26 0426)  Labs:  Recent Labs  06/12/15 2102  WBC 11.5*  HGB 14.4  PLT 227  CREATININE 1.29*   Estimated Creatinine Clearance: 76.1 mL/min (by C-G formula based on Cr of 1.29). No results for input(s): VANCOTROUGH, VANCOPEAK, VANCORANDOM, GENTTROUGH, GENTPEAK, GENTRANDOM, TOBRATROUGH, TOBRAPEAK, TOBRARND, AMIKACINPEAK, AMIKACINTROU, AMIKACIN in the last 72 hours.   Microbiology: No results found for this or any previous visit (from the past 720 hour(s)).  Medical History: Past Medical History  Diagnosis Date  . Diverticulitis   . Heart murmur   . Hypertension     Assessment: 39 y/o M to start Cipro per pharmacy for intra-abdominal infection, WBC 11.5, renal function ok for age, other labs as above.   Plan:  -Cipro 400 mg IV q12h -Flagyl per MD -Trend WBC, temp, renal function  Abran Duke 06/13/2015,5:36 AM

## 2015-06-13 NOTE — H&P (Signed)
[facility] History and Physical  Patient: Shane Guzman  MRN: [id number]  DOB: 1976-10-16  DOS: the patient was seen and examined on 06/13/2015 PCP: No PCP Per Patient  Referring physician: Dr. Kathrynn Humble Chief Complaint: Abdominal pain  HPI: Shane Guzman is a 39 y.o. male with Past medical history of diverticulitis with abscess and perforation, hypertension, active smoker. The patient is presenting with complaints of right lower quadrant abdominal pain. The pain started 2 days ago and has not improved. The patient complains of some fever some nausea as well as generalized fatigue. He denies any chest pain and denies any acid reflux he denies any shortness of breath denies any dizziness or lightheadedness. He denies any diarrhea and has regular daily bowel movement without any blood. He denies any burning urination. He denied to me that he drinks any alcohol and a daily basis. He also mentions he smokes only 10 seconds and a daily basis to me. Although documentation mentions he smokes one pack a day as well as drinks alcohol on a regular basis.  The patient is coming from home.  At his baseline ambulates without any support And is independent for most of his ADL manages her medication on his own.  Review of Systems: as mentioned in the history of present illness.  A comprehensive review of the other systems is negative.  Past Medical History  Diagnosis Date  . Diverticulitis   . Heart murmur   . Hypertension    Past Surgical History  Procedure Laterality Date  . Achilles tendon repair  06/2010    left; S/P tendon rupture   Social History:  reports that he has been smoking Cigarettes.  He has a 18 pack-year smoking history. He has never used smokeless tobacco. He reports that he drinks about 3.6 oz of alcohol per week. He reports that he does not use illicit drugs.  No Known Allergies  Family History  Problem Relation Age of Onset  . Diverticulitis Maternal  Grandmother     Prior to Admission medications   Medication Sig Start Date End Date Taking? Authorizing Provider  lisinopril-hydrochlorothiazide (PRINZIDE,ZESTORETIC) 10-12.5 MG per tablet Take 1 tablet by mouth daily.   Yes Historical Provider, MD  HYDROcodone-acetaminophen (NORCO/VICODIN) 5-325 MG per tablet Take 2 tablets by mouth every 4 (four) hours as needed. Patient not taking: Reported on 06/13/2015 12/13/13   Ezequiel Essex, MD    Physical Exam: Filed Vitals:   06/13/15 0130 06/13/15 0148 06/13/15 0254 06/13/15 0426  BP: 111/69 111/69 100/63 108/63  Pulse: 85 93 92 81  Temp:  98.5 F (36.9 C) 98.9 F (37.2 C) 98.6 F (37 C)  TempSrc:  Oral Oral Oral  Resp:   18 19  Height:    5' 5.5" (1.664 m)  Weight:    82.827 kg (182 lb 9.6 oz)  SpO2: 94% 96% 94% 94%    General: Alert, Awake and Oriented to Time, Place and Person. Appear in mild distress Eyes: PERRL ENT: Oral Mucosa clear moist. Neck: no JVD Cardiovascular: S1 and S2 Present, no Murmur, Peripheral Pulses Present Respiratory: Bilateral Air entry equal and Decreased,  Clear to Auscultation, no Crackles, no wheezes Abdomen: Bowel Sound present, Soft and non tender Skin: no Rash Extremities: no Pedal edema, no calf tenderness Neurologic: Grossly no focal neuro deficit.  Labs on Admission:  CBC:  Recent Labs Lab 06/12/15 2102  WBC 11.5*  NEUTROABS 8.2*  HGB 14.4  HCT 41.1  MCV 96.9  PLT 227  CMP     Component Value Date/Time   NA 136 06/12/2015 2102   K 4.1 06/12/2015 2102   CL 100* 06/12/2015 2102   CO2 26 06/12/2015 2102   GLUCOSE 101* 06/12/2015 2102   BUN 15 06/12/2015 2102   CREATININE 1.29* 06/12/2015 2102   CALCIUM 9.4 06/12/2015 2102   PROT 8.2* 06/12/2015 2102   ALBUMIN 4.1 06/12/2015 2102   AST 28 06/12/2015 2102   ALT 42 06/12/2015 2102   ALKPHOS 73 06/12/2015 2102   BILITOT 0.8 06/12/2015 2102   GFRNONAA >60 06/12/2015 2102   GFRAA >60 06/12/2015 2102     Recent Labs Lab  06/12/15 2102  LIPASE 20*    No results for input(s): CKTOTAL, CKMB, CKMBINDEX, TROPONINI in the last 168 hours. BNP (last 3 results) No results for input(s): BNP in the last 8760 hours.  ProBNP (last 3 results) No results for input(s): PROBNP in the last 8760 hours.   Radiological Exams on Admission: Ct Abdomen Pelvis W Contrast  06/13/2015   CLINICAL DATA:  Acute onset of right lower quadrant abdominal pain and nausea. Initial encounter.  EXAM: CT ABDOMEN AND PELVIS WITH CONTRAST  TECHNIQUE: Multidetector CT imaging of the abdomen and pelvis was performed using the standard protocol following bolus administration of intravenous contrast.  CONTRAST:  173m OMNIPAQUE IOHEXOL 300 MG/ML  SOLN  COMPARISON:  CT of the abdomen and pelvis performed 11/22/2011  FINDINGS: The visualized lung bases are clear. Minimal calcification is noted at the aortic valve.  The liver and spleen are unremarkable in appearance. The gallbladder is within normal limits. The pancreas and adrenal glands are unremarkable.  The kidneys are unremarkable in appearance. There is no evidence of hydronephrosis. No renal or ureteral stones are seen. Nonspecific perinephric stranding is noted bilaterally.  No free fluid is identified. The small bowel is unremarkable in appearance. The stomach is within normal limits. No acute vascular abnormalities are seen. Minimal calcification is noted along the distal abdominal aorta and its branches.  The appendix is long and unremarkable in appearance, containing air. There is no evidence of appendicitis.  Scattered diverticulosis is noted along the hepatic flexure of the colon, and along the descending and sigmoid colon.  There is marked focal wall thickening at the proximal to mid sigmoid colon, with diffuse surrounding soft tissue inflammation and inflamed diverticula. This likely reflects acute diverticulitis, though after completion of treatment for diverticulitis, would perform follow-up  colonoscopy to exclude an underlying mass. There is no evidence of perforation or abscess formation at this time.  The bladder is mildly distended and grossly unremarkable in appearance. The prostate remains normal in size. No inguinal lymphadenopathy is seen.  No acute osseous abnormalities are identified.  IMPRESSION: 1. Marked focal wall thickening at the proximal to mid sigmoid colon, with diffuse surrounding soft tissue inflammation and inflamed diverticula. This likely reflects acute diverticulitis, though would perform follow-up colonoscopy after completion of treatment for diverticulitis, to exclude an underlying mass. No evidence of perforation or abscess formation at this time. 2. Scattered diverticulosis along the hepatic flexure of the colon, and along the descending and sigmoid colon.   Electronically Signed   By: JGarald BaldingM.D.   On: 06/13/2015 00:20   Assessment/Plan Principal Problem:   Diverticulitis Active Problems:   TOBACCO USER   1. Diverticulitis The patient is presenting with abdominal pain. Due to his no improvement in abdominal pain CT abdomen was performed which did show acute diverticulitis with focal wall thickening of  the proximal to mid sigmoid colon. With this he does not have any significant leukocytosis no fever. I would treat him with Cipro and Flagyl. Check ESR and CRP. Pain management will be treated Toradol and oral Norco. Continue close monitoring.  Advance goals of care discussion: Full code    DVT Prophylaxis: subcutaneous Heparin Nutrition: nothing by mouth except medication  Disposition: Admitted as observation, MedSurg  unit.  Author: Berle Mull, MD [facility] Pager: [phone] 06/13/2015  If 7PM-7AM, please contact night-coverage [email or web address] Password [email or web address]

## 2015-06-14 LAB — COMPREHENSIVE METABOLIC PANEL
ALT: 24 U/L (ref 17–63)
ANION GAP: 5 (ref 5–15)
AST: 19 U/L (ref 15–41)
Albumin: 2.8 g/dL — ABNORMAL LOW (ref 3.5–5.0)
Alkaline Phosphatase: 57 U/L (ref 38–126)
BILIRUBIN TOTAL: 0.8 mg/dL (ref 0.3–1.2)
BUN: 14 mg/dL (ref 6–20)
CHLORIDE: 106 mmol/L (ref 101–111)
CO2: 26 mmol/L (ref 22–32)
Calcium: 8.7 mg/dL — ABNORMAL LOW (ref 8.9–10.3)
Creatinine, Ser: 1.47 mg/dL — ABNORMAL HIGH (ref 0.61–1.24)
GFR calc Af Amer: >60 mL/min (ref >60)
GFR, EST NON AFRICAN AMERICAN: 58 mL/min — AB (ref >60)
GLUCOSE: 104 mg/dL — AB (ref 65–99)
Potassium: 3.9 mmol/L (ref 3.5–5.1)
SODIUM: 137 mmol/L (ref 135–145)
Total Protein: 7.1 g/dL (ref 6.5–8.1)

## 2015-06-14 LAB — CBC
HEMATOCRIT: 34.7 % — AB (ref 39.0–52.0)
Hemoglobin: 12 g/dL — ABNORMAL LOW (ref 13.0–17.0)
MCH: 33.8 pg (ref 26.0–34.0)
MCHC: 34.6 g/dL (ref 30.0–36.0)
MCV: 97.7 fL (ref 78.0–100.0)
Platelets: 198 10*3/uL (ref 150–400)
RBC: 3.55 MIL/uL — AB (ref 4.22–5.81)
RDW: 11.7 % (ref 11.5–15.5)
WBC: 7.7 10*3/uL (ref 4.0–10.5)

## 2015-06-14 LAB — PROTIME-INR
INR: 1.25 (ref 0.00–1.49)
PROTHROMBIN TIME: 15.9 s — AB (ref 11.6–15.2)

## 2015-06-14 MED ORDER — CIPROFLOXACIN HCL 500 MG PO TABS: 500.0000 mg | ORAL_TABLET | Freq: Two times a day (BID) | ORAL | Status: DC

## 2015-06-14 MED ORDER — HYDROCODONE-ACETAMINOPHEN 5-325 MG PO TABS: 1.0000 | ORAL_TABLET | Freq: Four times a day (QID) | ORAL | Status: DC | PRN

## 2015-06-14 MED ORDER — METRONIDAZOLE 500 MG PO TABS: 500.0000 mg | ORAL_TABLET | Freq: Three times a day (TID) | ORAL | Status: DC

## 2015-06-14 NOTE — Progress Notes (Signed)
Patient discharge teaching given, including activity, diet, follow-up appoints, and medications. Patient verbalized understanding of all discharge instructions. IV access was d/c'd. Vitals are stable. Skin is intact except as charted in most recent assessments. Pt to be escorted out by NT, to be driven home by family.  Saachi Zale, MBA, BS, RN 

## 2015-06-14 NOTE — Discharge Summary (Signed)
Physician Discharge Summary  Shane Guzman ZOX:[id number] DOB: 1976-02-03 DOA: 06/12/2015  PCP: No PCP Per Patient  Admit date: 06/12/2015 Discharge date: 06/14/2015  Time spent: 40 minutes  Recommendations for Outpatient Follow-up:  1. Follow up with [facility] gastroenterology for evaluation for colonoscopy. 2. Cipro and Flagyl for 7 more days.  Discharge Diagnoses:  Principal Problem:   Diverticulitis Active Problems:   TOBACCO USER   Discharge Condition: Stable  Diet recommendation: Heart healthy  Filed Weights   06/12/15 2058 06/13/15 0426 06/14/15 0500  Weight: 88.111 kg (194 lb 4 oz) 82.827 kg (182 lb 9.6 oz) 82.82 kg (182 lb 9.4 oz)    History of present illness:  Shane Guzman is a 39 y.o. male with Past medical history of diverticulitis with abscess and perforation, hypertension, active smoker. The patient is presenting with complaints of right lower quadrant abdominal pain. The pain started 2 days ago and has not improved. The patient complains of some fever some nausea as well as generalized fatigue. He denies any chest pain and denies any acid reflux he denies any shortness of breath denies any dizziness or lightheadedness. He denies any diarrhea and has regular daily bowel movement without any blood. He denies any burning urination. He denied to me that he drinks any alcohol and a daily basis. He also mentions he smokes only 10 seconds and a daily basis to me. Although documentation mentions he smokes one pack a day as well as drinks alcohol on a regular basis. The patient is coming from home.  At his baseline ambulates without any support And is independent for most of his ADL manages her medication on his own.  Hospital Course:   Acute diverticulitis Patient presented to the hospital with abdominal pain, slight nausea. CT abdomen showed acute diverticulitis with focal wall thickening of the proximal to mid sigmoid colon Started on ciprofloxacin and  metronidazole. Tolerated clear liquids advanced to regular diet and tolerating that without problems. Discharge and 7 more days of Cipro/[place]. Requested pain medications, Vicodin 15 pills prescribed to the patient.  Thickening of the colonic wall Patient has had history of acute diverticulitis with perforation/contained abscess in 2012. CT scan showed focal wall thickening in the sigmoid colon and recommended follow-up with colonoscopy. Directed to follow-up with [facility] gastroenterology as outpatient.  Hypertension Reasonably controlled, continue home medications.  Cigarette smoking Patient counseled  Procedures:  None  Consultations:  *None  Discharge Exam: Filed Vitals:   06/14/15 0800  BP: 110/74  Pulse: 89  Temp: 98 F (36.7 C)  Resp: 17   General: Alert and awake, oriented x3, not in any acute distress. HEENT: anicteric sclera, pupils reactive to light and accommodation, EOMI CVS: S1-S2 clear, no murmur rubs or gallops Chest: clear to auscultation bilaterally, no wheezing, rales or rhonchi Abdomen: soft nontender, nondistended, normal bowel sounds, no organomegaly Extremities: no cyanosis, clubbing or edema noted bilaterally Neuro: Cranial nerves II-XII intact, no focal neurological deficits  Discharge Instructions   Discharge Instructions    Diet - low sodium heart healthy    Complete by:  As directed      Increase activity slowly    Complete by:  As directed           Current Discharge Medication List    START taking these medications   Details  ciprofloxacin (CIPRO) 500 MG tablet Take 1 tablet (500 mg total) by mouth 2 (two) times daily. Qty: 14 tablet, Refills: 0    metroNIDAZOLE (FLAGYL) 500 MG  tablet Take 1 tablet (500 mg total) by mouth 3 (three) times daily. Qty: 21 tablet, Refills: 0      CONTINUE these medications which have CHANGED   Details  HYDROcodone-acetaminophen (NORCO/VICODIN) 5-325 MG per tablet Take 1 tablet by mouth every 6  (six) hours as needed. Qty: 15 tablet, Refills: 0      CONTINUE these medications which have NOT CHANGED   Details  lisinopril-hydrochlorothiazide (PRINZIDE,ZESTORETIC) 10-12.5 MG per tablet Take 1 tablet by mouth daily.       No Known Allergies Follow-up Information    Follow up with [facility]. Schedule an appointment as soon as possible for a visit in 2 weeks.   Specialty:  Gastroenterology   Contact information:   [address] [place] [address] [phone]       The results of significant diagnostics from this hospitalization (including imaging, microbiology, ancillary and laboratory) are listed below for reference.    Significant Diagnostic Studies: Ct Abdomen Pelvis W Contrast  06/13/2015   CLINICAL DATA:  Acute onset of right lower quadrant abdominal pain and nausea. Initial encounter.  EXAM: CT ABDOMEN AND PELVIS WITH CONTRAST  TECHNIQUE: Multidetector CT imaging of the abdomen and pelvis was performed using the standard protocol following bolus administration of intravenous contrast.  CONTRAST:  OMNIPAQUE IOHEXOL 300 MG/ML  SOLN  COMPARISON:  CT of the abdomen and pelvis performed 11/22/2011  FINDINGS: The visualized lung bases are clear. Minimal calcification is noted at the aortic valve.  The liver and spleen are unremarkable in appearance. The gallbladder is within normal limits. The pancreas and adrenal glands are unremarkable.  The kidneys are unremarkable in appearance. There is no evidence of hydronephrosis. No renal or ureteral stones are seen. Nonspecific perinephric stranding is noted bilaterally.  No free fluid is identified. The small bowel is unremarkable in appearance. The stomach is within normal limits. No acute vascular abnormalities are seen. Minimal calcification is noted along the distal abdominal aorta and its branches.  The appendix is long and unremarkable in appearance, containing air. There is no evidence of  appendicitis.  Scattered diverticulosis is noted along the hepatic flexure of the colon, and along the descending and sigmoid colon.  There is marked focal wall thickening at the proximal to mid sigmoid colon, with diffuse surrounding soft tissue inflammation and inflamed diverticula. This likely reflects acute diverticulitis, though after completion of treatment for diverticulitis, would perform follow-up colonoscopy to exclude an underlying mass. There is no evidence of perforation or abscess formation at this time.  The bladder is mildly distended and grossly unremarkable in appearance. The prostate remains normal in size. No inguinal lymphadenopathy is seen.  No acute osseous abnormalities are identified.  IMPRESSION: 1. Marked focal wall thickening at the proximal to mid sigmoid colon, with diffuse surrounding soft tissue inflammation and inflamed diverticula. This likely reflects acute diverticulitis, though would perform follow-up colonoscopy after completion of treatment for diverticulitis, to exclude an underlying mass. No evidence of perforation or abscess formation at this time. 2. Scattered diverticulosis along the hepatic flexure of the colon, and along the descending and sigmoid colon.   Electronically Signed   By: Roanna Raider M.D.   On: 06/13/2015 00:20    Microbiology: No results found for this or any previous visit (from the past 240 hour(s)).   Labs: Basic Metabolic Panel:  Recent Labs Lab 06/12/15 2102 06/14/15 0520  NA 136 137  K 4.1 3.9  CL 100* 106  CO2 26 26  GLUCOSE 101* 104*  BUN 15 14  CREATININE 1.29* 1.47*  CALCIUM 9.4 8.7*   Liver Function Tests:  Recent Labs Lab 06/12/15 2102 06/14/15 0520  AST 28 19  ALT 42 24  ALKPHOS 73 57  BILITOT 0.8 0.8  PROT 8.2* 7.1  ALBUMIN 4.1 2.8*    Recent Labs Lab 06/12/15 2102  LIPASE 20*   No results for input(s): AMMONIA in the last 168 hours. CBC:  Recent Labs Lab 06/12/15 2102 06/14/15 0520  WBC 11.5*  7.7  NEUTROABS 8.2*  --   HGB 14.4 12.0*  HCT 41.1 34.7*  MCV 96.9 97.7  PLT 227 198   Cardiac Enzymes: No results for input(s): CKTOTAL, CKMB, CKMBINDEX, TROPONINI in the last 168 hours. BNP: BNP (last 3 results) No results for input(s): BNP in the last 8760 hours.  ProBNP (last 3 results) No results for input(s): PROBNP in the last 8760 hours.  CBG: No results for input(s): GLUCAP in the last 168 hours.     Signed:  Cheyla Duchemin A  [facility] 06/14/2015, 1:51 PM

## 2015-06-16 ENCOUNTER — Encounter: Payer: Self-pay | Admitting: Internal Medicine

## 2015-07-14 ENCOUNTER — Encounter (HOSPITAL_COMMUNITY): Payer: Self-pay | Admitting: Emergency Medicine

## 2015-07-14 ENCOUNTER — Emergency Department (HOSPITAL_COMMUNITY)
Admission: EM | Admit: 2015-07-14 | Discharge: 2015-07-14 | Disposition: A | Discharge status: Home / Self Care | Payer: Commercial Managed Care - HMO | Attending: Emergency Medicine | Admitting: Emergency Medicine

## 2015-07-14 DIAGNOSIS — T783XXA Angioneurotic edema, initial encounter: Secondary | ICD-10-CM | POA: Diagnosis not present

## 2015-07-14 DIAGNOSIS — Y9289 Other specified places as the place of occurrence of the external cause: Secondary | ICD-10-CM | POA: Diagnosis not present

## 2015-07-14 DIAGNOSIS — R011 Cardiac murmur, unspecified: Secondary | ICD-10-CM | POA: Insufficient documentation

## 2015-07-14 DIAGNOSIS — Z8719 Personal history of other diseases of the digestive system: Secondary | ICD-10-CM | POA: Insufficient documentation

## 2015-07-14 DIAGNOSIS — Z792 Long term (current) use of antibiotics: Secondary | ICD-10-CM | POA: Diagnosis not present

## 2015-07-14 DIAGNOSIS — Z720 Tobacco use: Secondary | ICD-10-CM | POA: Insufficient documentation

## 2015-07-14 DIAGNOSIS — I10 Essential (primary) hypertension: Secondary | ICD-10-CM | POA: Insufficient documentation

## 2015-07-14 DIAGNOSIS — R220 Localized swelling, mass and lump, head: Secondary | ICD-10-CM | POA: Diagnosis present

## 2015-07-14 DIAGNOSIS — Z79899 Other long term (current) drug therapy: Secondary | ICD-10-CM | POA: Diagnosis not present

## 2015-07-14 DIAGNOSIS — Y9389 Activity, other specified: Secondary | ICD-10-CM | POA: Insufficient documentation

## 2015-07-14 DIAGNOSIS — X58XXXA Exposure to other specified factors, initial encounter: Secondary | ICD-10-CM | POA: Diagnosis not present

## 2015-07-14 DIAGNOSIS — Y998 Other external cause status: Secondary | ICD-10-CM | POA: Insufficient documentation

## 2015-07-14 MED ORDER — DIPHENHYDRAMINE HCL 50 MG/ML IJ SOLN
25.0000 mg | Freq: Once | INTRAMUSCULAR | Status: AC
  Administered 2015-07-14: 25 mg via INTRAVENOUS
  Filled 2015-07-14: qty 1

## 2015-07-14 NOTE — ED Notes (Signed)
The pt reports that his lip is sl less swollen.  Alert no respiratory diustress

## 2015-07-14 NOTE — Discharge Instructions (Signed)
STOP LISINOPRIL CALL PRIMARY DOCTOR FOR A NEW MEDICATION NOT RELATED TO LISINOPRIL

## 2015-07-14 NOTE — ED Notes (Signed)
Family at bedside. 

## 2015-07-14 NOTE — ED Provider Notes (Signed)
CSN: [id number]     Arrival date & time 07/14/15  0036 History  This chart was scribed for Shane Rhine, MD by Shane Guzman, ED Scribe. This patient was seen in room A05C/A05C and the patient's care was started at 3:30 AM.    Chief Complaint  Patient presents with  . Angioedema      The history is provided by the patient. No language interpreter was used.   HPI Comments: Shane Guzman is a 39 y.o. male who has hx of HTN presents to the Emergency Department complaining of sudden onset upper lip swelling onset 7 hours ago. Pt has been taking Lisinopril for the past 2 months. He states he has not had this happen before. Pt denies pain, difficulty swallowing, fevers, vomiting, chest pain, sob, rash, itching, and insect bites.  Past Medical History  Diagnosis Date  . Diverticulitis   . Heart murmur   . Hypertension    Past Surgical History  Procedure Laterality Date  . Achilles tendon repair  06/2010    left; S/P tendon rupture   Family History  Problem Relation Age of Onset  . Diverticulitis Maternal Grandmother    History  Substance Use Topics  . Smoking status: Current Every Day Smoker -- 0.00 packs/day for 18 years    Types: Cigarettes  . Smokeless tobacco: Never Used  . Alcohol Use: Yes    Review of Systems  Constitutional: Negative for fever.  HENT: Positive for facial swelling. Negative for trouble swallowing.   Respiratory: Negative for shortness of breath.   Gastrointestinal: Negative for vomiting and diarrhea.  Skin: Negative for rash.  All other systems reviewed and are negative.     Allergies  Review of patient's allergies indicates no known allergies.  Home Medications   Prior to Admission medications   Medication Sig Start Date End Date Taking? Authorizing Provider  ciprofloxacin (CIPRO) 500 MG tablet Take 1 tablet (500 mg total) by mouth 2 (two) times daily. 06/14/15   Clydia Llano, MD  HYDROcodone-acetaminophen (NORCO/VICODIN) 5-325 MG per  tablet Take 1 tablet by mouth every 6 (six) hours as needed. 06/14/15   Clydia Llano, MD  lisinopril-hydrochlorothiazide (PRINZIDE,ZESTORETIC) 10-12.5 MG per tablet Take 1 tablet by mouth daily.    Historical Provider, MD  metroNIDAZOLE (FLAGYL) 500 MG tablet Take 1 tablet (500 mg total) by mouth 3 (three) times daily. 06/14/15   Clydia Llano, MD   Triage vitals: BP 108/72 mmHg  Pulse 93  Temp(Src) 97.8 F (36.6 C) (Oral)  Resp 14  SpO2 99% Physical Exam CONSTITUTIONAL: Well developed/well nourished HEAD: Normocephalic/atraumatic EYES: EOMI ENMT: Mucous membranes moist, angioedema of upper lip only, no swelling of lower lip, no tongue edema, uvula midline without edema NECK: supple no meningeal signs SPINE/BACK:entire spine nontender CV: S1/S2 noted, no murmurs/rubs/gallops noted LUNGS: Lungs are clear to auscultation bilaterally, no apparent distress ABDOMEN: soft, nontender, no rebound or guarding, bowel sounds noted throughout abdomen NEURO: Pt is awake/alert/appropriate, moves all extremitiesx4.  No facial droop.   EXTREMITIES: pulses normal/equal, full ROM SKIN: warm, color normal, no rash PSYCH: no abnormalities of mood noted, alert and oriented to situation  ED Course  Procedures  DIAGNOSTIC STUDIES: Oxygen Saturation is 99% on RA, normal by my interpretation.  COORDINATION OF CARE:  3:32 AM Discussed treatment plan which includes benadryl, follow up with PCP for new BP medication with pt at bedside and pt agreed to plan.  Medications  diphenhydrAMINE (BENADRYL) injection 25 mg (25 mg Intravenous Given 07/14/15 0429)  5:13 AM PT STABLE NO WORSENING EDEMA NOTED HE WOULD LIKE TO GO HOME SINCE HE HAS HAD NO WORSENING, I FEEL HE IS APPROPRIATE FOR D/C HOME WE DISCUSSED STRICT RETURN PRECAUTIONS TOLD TO STOP LISINOPRIL, AS LIKELY ACE INHIBITOR INDUCED   MDM   Final diagnoses:  Angioedema of lips, initial encounter    Nursing notes including past medical history and  social history reviewed and considered in documentation   I personally performed the services described in this documentation, which was scribed in my presence. The recorded information has been reviewed and is accurate.     Shane Rhine, MD 07/14/15 [id number]

## 2015-07-14 NOTE — ED Notes (Signed)
Pt. reports upper lip swelling onset this evening , denies injury , airway intact , no tongue swelling /respirations unlabored .

## 2015-07-26 ENCOUNTER — Encounter: Payer: Self-pay | Admitting: *Deleted

## 2015-08-19 ENCOUNTER — Ambulatory Visit: Payer: Commercial Managed Care - HMO | Admitting: Internal Medicine

## 2015-09-27 ENCOUNTER — Ambulatory Visit: Payer: Commercial Managed Care - HMO

## 2016-02-22 ENCOUNTER — Encounter (HOSPITAL_COMMUNITY): Payer: Self-pay | Admitting: Emergency Medicine

## 2016-02-22 ENCOUNTER — Emergency Department (HOSPITAL_COMMUNITY)
Admission: EM | Admit: 2016-02-22 | Discharge: 2016-02-22 | Disposition: A | Discharge status: Home / Self Care | Payer: BLUE CROSS/BLUE SHIELD | Attending: Emergency Medicine | Admitting: Emergency Medicine

## 2016-02-22 DIAGNOSIS — M545 Low back pain, unspecified: Secondary | ICD-10-CM

## 2016-02-22 DIAGNOSIS — R011 Cardiac murmur, unspecified: Secondary | ICD-10-CM | POA: Insufficient documentation

## 2016-02-22 DIAGNOSIS — I10 Essential (primary) hypertension: Secondary | ICD-10-CM | POA: Diagnosis not present

## 2016-02-22 DIAGNOSIS — F17210 Nicotine dependence, cigarettes, uncomplicated: Secondary | ICD-10-CM | POA: Diagnosis not present

## 2016-02-22 DIAGNOSIS — Z8719 Personal history of other diseases of the digestive system: Secondary | ICD-10-CM | POA: Insufficient documentation

## 2016-02-22 DIAGNOSIS — Z79899 Other long term (current) drug therapy: Secondary | ICD-10-CM | POA: Diagnosis not present

## 2016-02-22 MED ORDER — NAPROXEN 500 MG PO TABS: 500.0000 mg | ORAL_TABLET | Freq: Two times a day (BID) | ORAL | Status: DC

## 2016-02-22 MED ORDER — NAPROXEN 250 MG PO TABS
500.0000 mg | ORAL_TABLET | Freq: Once | ORAL | Status: AC
  Administered 2016-02-22: 500 mg via ORAL
  Filled 2016-02-22: qty 2

## 2016-02-22 MED ORDER — CYCLOBENZAPRINE HCL 5 MG PO TABS: 5.0000 mg | ORAL_TABLET | Freq: Three times a day (TID) | ORAL | Status: DC | PRN

## 2016-02-22 MED ORDER — CYCLOBENZAPRINE HCL 10 MG PO TABS
5.0000 mg | ORAL_TABLET | Freq: Once | ORAL | Status: AC
  Administered 2016-02-22: 5 mg via ORAL
  Filled 2016-02-22: qty 1

## 2016-02-22 NOTE — ED Notes (Signed)
Fast track provider concerned about pt's elevated BP and back pain, discussed with triage RN and pt placed back in the waiting room.

## 2016-02-22 NOTE — ED Notes (Signed)
EDP at bedside  

## 2016-02-22 NOTE — ED Provider Notes (Signed)
CSN: [id number]     Arrival date & time 02/22/16  0010 History   First MD Initiated Contact with Patient 02/22/16 [phone]     Chief Complaint  Patient presents with  . Back Pain     (Consider location/radiation/quality/duration/timing/severity/associated sxs/prior Treatment) HPI  This is a 40 year old male with a history of hypertension who presents with back pain. Patient states that he was getting out of a car earlier yesterday morning when he had onset of lower back pain. It is worse with movement. It is currently 10 out of 10. Patient reports pain is across his lower back. As achy in nature and he denies radiation. No urinary symptoms. Denies any weakness, numbness, tingling of the lower extremities. Denies any bowel or bladder difficulty. Patient denies any fevers, IV drug use, steroid use. Of note, patient has a history of hypertension. Noted to be hypertensive in triage. Denies headache or chest pain. Patient states that he recently was changed from lisinopril to amlodipine because he had an allergic reaction to lisinopril.  Past Medical History  Diagnosis Date  . Diverticulitis of intestine with abscess   . Heart murmur   . Hypertension    Past Surgical History  Procedure Laterality Date  . Achilles tendon repair  06/2010    left; S/P tendon rupture   Family History  Problem Relation Age of Onset  . Diverticulitis Maternal Grandmother    Social History  Substance Use Topics  . Smoking status: Current Every Day Smoker -- 0.00 packs/day for 18 years    Types: Cigarettes  . Smokeless tobacco: Never Used  . Alcohol Use: Yes    Review of Systems  Constitutional: Negative for fever.  Respiratory: Negative for shortness of breath.   Cardiovascular: Negative for chest pain.  Gastrointestinal: Negative for abdominal pain.  Genitourinary: Negative for dysuria, hematuria and difficulty urinating.  Musculoskeletal: Positive for back pain.  Neurological: Negative for weakness.  All  other systems reviewed and are negative.     Allergies  Lisinopril  Home Medications   Prior to Admission medications   Medication Sig Start Date End Date Taking? Authorizing Provider  amLODipine (NORVASC) 5 MG tablet Take 5 mg by mouth daily.   Yes Historical Provider, MD  cyclobenzaprine (FLEXERIL) 5 MG tablet Take 1 tablet (5 mg total) by mouth 3 (three) times daily as needed for muscle spasms. 02/22/16   Shon Baton, MD  naproxen (NAPROSYN) 500 MG tablet Take 1 tablet (500 mg total) by mouth 2 (two) times daily with a meal. 02/22/16   Shon Baton, MD   BP 159/101 mmHg  Pulse 84  Temp(Src) 98 F (36.7 C) (Oral)  Resp 17  Ht 5' 5.5" (1.664 m)  Wt 208 lb 8 oz (94.575 kg)  BMI 34.16 kg/m2  SpO2 91% Physical Exam  Constitutional: He is oriented to person, place, and time. He appears well-developed and well-nourished.  HENT:  Head: Normocephalic and atraumatic.  Cardiovascular: Normal rate, regular rhythm and normal heart sounds.   No murmur heard. Pulmonary/Chest: Effort normal and breath sounds normal. No respiratory distress. He has no wheezes.  Abdominal: Soft. Bowel sounds are normal. There is no tenderness. There is no rebound.  Musculoskeletal:  Tenderness palpation over the entire lower lumbar paraspinous muscle region, no midline tenderness, negative straight leg raise  Neurological: He is alert and oriented to person, place, and time.  5 out of 5 strength bilateral lower extremities  Skin: Skin is warm and dry.  Psychiatric: He has  a normal mood and affect.  Nursing note and vitals reviewed.   ED Course  Procedures (including critical care time) Labs Review Labs Reviewed - No data to display  Imaging Review No results found. I have personally reviewed and evaluated these images and lab results as part of my medical decision-making.   EKG Interpretation None      MDM   Final diagnoses:  Bilateral low back pain without sciatica  Essential  hypertension   Patient presents with low back pain. Worse with movement. Appears to be musculoskeletal in etiology. Has no red flags of back pain. Regarding his hypertension, he is asymptomatic. Patient was given Flexeril and naproxen. Follow up with primary physician regarding hypertension.  After history, exam, and medical workup I feel the patient has been appropriately medically screened and is safe for discharge home. Pertinent diagnoses were discussed with the patient. Patient was given return precautions.     Shon Batonourtney F Mansoor Hillyard, MD 02/22/16 [phone]

## 2016-02-22 NOTE — Discharge Instructions (Signed)
You were seen today for low back pain. This likely musculoskeletal in nature. You're also noted to be hypertensive. You need to follow-up with her primary physician for blood pressure recheck and medication titration.  Lumbosacral Strain Lumbosacral strain is a strain of any of the parts that make up your lumbosacral vertebrae. Your lumbosacral vertebrae are the bones that make up the lower third of your backbone. Your lumbosacral vertebrae are held together by muscles and tough, fibrous tissue (ligaments).  CAUSES  A sudden blow to your back can cause lumbosacral strain. Also, anything that causes an excessive stretch of the muscles in the low back can cause this strain. This is typically seen when people exert themselves strenuously, fall, lift heavy objects, bend, or crouch repeatedly. RISK FACTORS  Physically demanding work.  Participation in pushing or pulling sports or sports that require a sudden twist of the back (tennis, golf, baseball).  Weight lifting.  Excessive lower back curvature.  Forward-tilted pelvis.  Weak back or abdominal muscles or both.  Tight hamstrings. SIGNS AND SYMPTOMS  Lumbosacral strain may cause pain in the area of your injury or pain that moves (radiates) down your leg.  DIAGNOSIS Your health care provider can often diagnose lumbosacral strain through a physical exam. In some cases, you may need tests such as X-ray exams.  TREATMENT  Treatment for your lower back injury depends on many factors that your clinician will have to evaluate. However, most treatment will include the use of anti-inflammatory medicines. HOME CARE INSTRUCTIONS   Avoid hard physical activities (tennis, racquetball, waterskiing) if you are not in proper physical condition for it. This may aggravate or create problems.  If you have a back problem, avoid sports requiring sudden body movements. Swimming and walking are generally safer activities.  Maintain good posture.  Maintain a  healthy weight.  For acute conditions, you may put ice on the injured area.  Put ice in a plastic bag.  Place a towel between your skin and the bag.  Leave the ice on for 20 minutes, 2-3 times a day.  When the low back starts healing, stretching and strengthening exercises may be recommended. SEEK MEDICAL CARE IF:  Your back pain is getting worse.  You experience severe back pain not relieved with medicines. SEEK IMMEDIATE MEDICAL CARE IF:   You have numbness, tingling, weakness, or problems with the use of your arms or legs.  There is a change in bowel or bladder control.  You have increasing pain in any area of the body, including your belly (abdomen).  You notice shortness of breath, dizziness, or feel faint.  You feel sick to your stomach (nauseous), are throwing up (vomiting), or become sweaty.  You notice discoloration of your toes or legs, or your feet get very cold. MAKE SURE YOU:   Understand these instructions.  Will watch your condition.  Will get help right away if you are not doing well or get worse.   This information is not intended to replace advice given to you by your health care provider. Make sure you discuss any questions you have with your health care provider.   Document Released: 09/13/2005 Document Revised: 12/25/2014 Document Reviewed: 07/23/2013 Elsevier Interactive Patient Education [employer]2016 [employer] Inc.

## 2016-02-22 NOTE — ED Notes (Signed)
Pt. reports low back pain onset this evening , den ies injury or fall , ambulatory , pain increases with movement or changing positions . Denies urinary discomfort .

## 2016-10-15 ENCOUNTER — Encounter (HOSPITAL_COMMUNITY): Payer: Self-pay | Admitting: *Deleted

## 2016-10-15 ENCOUNTER — Emergency Department (HOSPITAL_COMMUNITY)
Admission: EM | Admit: 2016-10-15 | Discharge: 2016-10-15 | Disposition: A | Discharge status: Home / Self Care | Payer: Commercial Managed Care - HMO | Attending: Emergency Medicine | Admitting: Emergency Medicine

## 2016-10-15 DIAGNOSIS — I10 Essential (primary) hypertension: Secondary | ICD-10-CM | POA: Diagnosis not present

## 2016-10-15 DIAGNOSIS — K029 Dental caries, unspecified: Secondary | ICD-10-CM | POA: Diagnosis not present

## 2016-10-15 DIAGNOSIS — F17210 Nicotine dependence, cigarettes, uncomplicated: Secondary | ICD-10-CM | POA: Diagnosis not present

## 2016-10-15 DIAGNOSIS — K0889 Other specified disorders of teeth and supporting structures: Secondary | ICD-10-CM | POA: Diagnosis not present

## 2016-10-15 DIAGNOSIS — K047 Periapical abscess without sinus: Secondary | ICD-10-CM

## 2016-10-15 MED ORDER — HYDROCODONE-ACETAMINOPHEN 5-325 MG PO TABS: 2.0000 | ORAL_TABLET | ORAL | 0 refills | Status: DC | PRN

## 2016-10-15 MED ORDER — HYDROCODONE-ACETAMINOPHEN 5-325 MG PO TABS
1.0000 | ORAL_TABLET | Freq: Once | ORAL | Status: AC
  Administered 2016-10-15: 1 via ORAL
  Filled 2016-10-15: qty 1

## 2016-10-15 MED ORDER — CLINDAMYCIN HCL 150 MG PO CAPS
300.0000 mg | ORAL_CAPSULE | Freq: Once | ORAL | Status: AC
  Administered 2016-10-15: 300 mg via ORAL
  Filled 2016-10-15: qty 2

## 2016-10-15 MED ORDER — CLINDAMYCIN HCL 150 MG PO CAPS: 300.0000 mg | ORAL_CAPSULE | Freq: Three times a day (TID) | ORAL | 0 refills | Status: DC

## 2016-10-15 NOTE — ED Provider Notes (Signed)
[facility]-EMERGENCY DEPT Provider Note   CSN: [id number] Arrival date & time: 10/15/16  1210  By signing my name below, I, Doreatha MartinEva Mathews, attest that this documentation has been prepared under the direction and in the presence of Texas InstrumentsSamantha Tripp Dowless, PA-C.  Electronically Signed: Doreatha MartinEva Mathews, ED Scribe. 10/15/16. 2:05 PM.     History   Chief Complaint Chief Complaint  Patient presents with  . Dental Pain    HPI Shane Guzman is a 40 y.o. male who presents to the Emergency Department complaining of moderate left upper dental pain onset weeks ago and worsened last night with associated left cheek swelling onset last night. Pt was seen by dentistry for the issue 2 weeks ago and finished a 10 day course of Augmentin a week ago. Pt states his pain improved while taking the antibiotics; however, it has worsened again. Pt states he has taken Norco and ibuprofen with some relief of pain. He reports he has a f/u appointment with dentistry in 3 days for possible tooth extraction. He denies fever, difficulty swallowing.   The history is provided by the patient. No language interpreter was used.    Past Medical History:  Diagnosis Date  . Diverticulitis of intestine with abscess   . Heart murmur   . Hypertension     Patient Active Problem List   Diagnosis Date Noted  . Diverticulitis 06/13/2015  . Sigmoid diverticulitis 12/06/2011  . Diverticulitis of colon with perforation 11/22/2011  . TOBACCO USER 09/04/2009  . DIVERTICULAR DISEASE 11/18/2007  . MURMUR 11/18/2007    Past Surgical History:  Procedure Laterality Date  . ACHILLES TENDON REPAIR  06/2010   left; S/P tendon rupture       Home Medications    Prior to Admission medications   Medication Sig Start Date End Date Taking? Authorizing Provider  amLODipine (NORVASC) 5 MG tablet Take 5 mg by mouth daily.    Historical Provider, MD  clindamycin (CLEOCIN) 150 MG capsule Take 2 capsules (300 mg total) by mouth 3 (three) times  daily. 10/15/16   Samantha Tripp Dowless, PA-C  cyclobenzaprine (FLEXERIL) 5 MG tablet Take 1 tablet (5 mg total) by mouth 3 (three) times daily as needed for muscle spasms. 02/22/16   Shon Batonourtney F Horton, MD  HYDROcodone-acetaminophen (NORCO/VICODIN) 5-325 MG tablet Take 2 tablets by mouth every 4 (four) hours as needed. 10/15/16   Samantha Tripp Dowless, PA-C  naproxen (NAPROSYN) 500 MG tablet Take 1 tablet (500 mg total) by mouth 2 (two) times daily with a meal. 02/22/16   Shon Batonourtney F Horton, MD    Family History Family History  Problem Relation Age of Onset  . Diverticulitis Maternal Grandmother     Social History Social History  Substance Use Topics  . Smoking status: Current Every Day Smoker    Packs/day: 0.00    Years: 18.00    Types: Cigarettes  . Smokeless tobacco: Never Used  . Alcohol use Yes     Allergies   Lisinopril   Review of Systems Review of Systems A complete 10 system review of systems was obtained and all systems are negative except as noted in the HPI and PMH.    Physical Exam Updated Vital Signs BP (!) 160/104 (BP Location: Right Arm)   Pulse 86   Temp 98.5 F (36.9 C) (Oral)   Resp 16   Ht 5' 5.5" (1.664 m)   Wt 212 lb 1 oz (96.2 kg)   SpO2 96%   BMI 34.75 kg/m  Physical Exam  Constitutional: He is oriented to person, place, and time. He appears well-developed and well-nourished. No distress.  HENT:  Head: Normocephalic and atraumatic.  Mouth/Throat: Dental caries present. No dental abscesses.    No swelling under tongue. Moderate swelling of left sided buccal mucosa without fluctuance. No trismus.   Eyes: Conjunctivae are normal. Right eye exhibits no discharge. Left eye exhibits no discharge. No scleral icterus.  Cardiovascular: Normal rate.   Pulmonary/Chest: Effort normal.  Neurological: He is alert and oriented to person, place, and time. Coordination normal.  Skin: Skin is warm and dry. No rash noted. He is not diaphoretic. No erythema.  No pallor.  Psychiatric: He has a normal mood and affect. His behavior is normal.  Nursing note and vitals reviewed.   ED Treatments / Results   DIAGNOSTIC STUDIES: Oxygen Saturation is 96% on RA, adequate by my interpretation.    COORDINATION OF CARE: 2:03 PM Discussed treatment plan with pt at bedside which includes antibiotics, f/u with dentistry as scheduled and pt agreed to plan.    Labs (all labs ordered are listed, but only abnormal results are displayed) Labs Reviewed - No data to display  EKG  EKG Interpretation None       Radiology No results found.  Procedures Procedures (including critical care time)  Medications Ordered in ED Medications  clindamycin (CLEOCIN) capsule 300 mg (300 mg Oral Given 10/15/16 1438)  HYDROcodone-acetaminophen (NORCO/VICODIN) 5-325 MG per tablet 1 tablet (1 tablet Oral Given 10/15/16 1438)     Initial Impression / Assessment and Plan / ED Course  I have reviewed the triage vital signs and the nursing notes.  Pertinent labs & imaging results that were available during my care of the patient were reviewed by me and considered in my medical decision making (see chart for details).  Clinical Course    Patient with dentalgia and facial swelling. On exam, there is no evidence of a drainable abscess. Pt is swallowing without difficulty, tolerating secretions. No trismus, glossal elevation, unilateral tonsillar swelling. No evidence of retropharyngeal or peritonsillar abscess or Ludwig angina. Will treat with Clindamycin, Norco. Pt has scheduled appointment with dentist on Nov 1. Discussed return precautions. Pt safe for discharge. Pt is agreeable to plan.     Final Clinical Impressions(s) / ED Diagnoses   Final diagnoses:  Dental caries  Dental infection    New Prescriptions New Prescriptions   CLINDAMYCIN (CLEOCIN) 150 MG CAPSULE    Take 2 capsules (300 mg total) by mouth 3 (three) times daily.   HYDROCODONE-ACETAMINOPHEN  (NORCO/VICODIN) 5-325 MG TABLET    Take 2 tablets by mouth every 4 (four) hours as needed.    I personally performed the services described in this documentation, which was scribed in my presence. The recorded information has been reviewed and is accurate.      Lester KinsmanSamantha Tripp Forest CityDow[place], PA-C 10/15/16 1535    Geoffery Lyonsouglas Delo, MD 10/19/16 1322

## 2016-10-15 NOTE — Discharge Instructions (Signed)
Take antibiotics as prescribed. Take pain medication as needed. Follow-up with your dentist as soon as possible for reevaluation. Return to the ED if you experience Worsening of your symptoms, difficulty swallowing or breathing, fevers.

## 2016-10-15 NOTE — ED Triage Notes (Signed)
Pt is here with left upper dental and swelling.  Pt has already had one complete round of anbx and now it is coming back.  May need to have pulled

## 2016-10-16 ENCOUNTER — Emergency Department (HOSPITAL_COMMUNITY): Payer: BLUE CROSS/BLUE SHIELD

## 2016-10-16 ENCOUNTER — Encounter (HOSPITAL_COMMUNITY): Payer: Self-pay | Admitting: *Deleted

## 2016-10-16 ENCOUNTER — Emergency Department (HOSPITAL_COMMUNITY)
Admission: EM | Admit: 2016-10-16 | Discharge: 2016-10-16 | Disposition: A | Discharge status: Home / Self Care | Payer: BLUE CROSS/BLUE SHIELD | Attending: Emergency Medicine | Admitting: Emergency Medicine

## 2016-10-16 DIAGNOSIS — F17210 Nicotine dependence, cigarettes, uncomplicated: Secondary | ICD-10-CM | POA: Insufficient documentation

## 2016-10-16 DIAGNOSIS — I10 Essential (primary) hypertension: Secondary | ICD-10-CM | POA: Diagnosis not present

## 2016-10-16 DIAGNOSIS — Z79899 Other long term (current) drug therapy: Secondary | ICD-10-CM | POA: Diagnosis not present

## 2016-10-16 DIAGNOSIS — K047 Periapical abscess without sinus: Secondary | ICD-10-CM | POA: Insufficient documentation

## 2016-10-16 DIAGNOSIS — L03211 Cellulitis of face: Secondary | ICD-10-CM | POA: Diagnosis not present

## 2016-10-16 DIAGNOSIS — R220 Localized swelling, mass and lump, head: Secondary | ICD-10-CM | POA: Diagnosis not present

## 2016-10-16 LAB — CBC WITH DIFFERENTIAL/PLATELET
BASOS ABS: 0 10*3/uL (ref 0.0–0.1)
Basophils Relative: 0 %
Eosinophils Absolute: 0.1 10*3/uL (ref 0.0–0.7)
Eosinophils Relative: 1 %
HCT: 44.2 % (ref 39.0–52.0)
Hemoglobin: 15 g/dL (ref 13.0–17.0)
LYMPHS PCT: 11 %
Lymphs Abs: 1.1 10*3/uL (ref 0.7–4.0)
MCH: 34.2 pg — ABNORMAL HIGH (ref 26.0–34.0)
MCHC: 33.9 g/dL (ref 30.0–36.0)
MCV: 100.7 fL — AB (ref 78.0–100.0)
MONO ABS: 0.7 10*3/uL (ref 0.1–1.0)
MONOS PCT: 7 %
NEUTROS PCT: 81 %
Neutro Abs: 8.8 10*3/uL — ABNORMAL HIGH (ref 1.7–7.7)
Platelets: 192 10*3/uL (ref 150–400)
RBC: 4.39 MIL/uL (ref 4.22–5.81)
RDW: 11.9 % (ref 11.5–15.5)
WBC: 10.7 10*3/uL — ABNORMAL HIGH (ref 4.0–10.5)

## 2016-10-16 LAB — BASIC METABOLIC PANEL
ANION GAP: 7 (ref 5–15)
BUN: 14 mg/dL (ref 6–20)
CALCIUM: 9 mg/dL (ref 8.9–10.3)
CHLORIDE: 103 mmol/L (ref 101–111)
CO2: 26 mmol/L (ref 22–32)
Creatinine, Ser: 1.3 mg/dL — ABNORMAL HIGH (ref 0.61–1.24)
GFR calc non Af Amer: >60 mL/min (ref >60)
GLUCOSE: 160 mg/dL — AB (ref 65–99)
Potassium: 3.8 mmol/L (ref 3.5–5.1)
Sodium: 136 mmol/L (ref 135–145)

## 2016-10-16 MED ORDER — SODIUM CHLORIDE 0.9 % IV BOLUS (SEPSIS)
1000.0000 mL | Freq: Once | INTRAVENOUS | Status: AC
  Administered 2016-10-16: 1000 mL via INTRAVENOUS

## 2016-10-16 MED ORDER — OXYCODONE-ACETAMINOPHEN 5-325 MG PO TABS: 1.0000 | ORAL_TABLET | ORAL | 0 refills | Status: DC | PRN

## 2016-10-16 MED ORDER — IBUPROFEN 600 MG PO TABS: 600.0000 mg | ORAL_TABLET | Freq: Four times a day (QID) | ORAL | 0 refills | Status: DC | PRN

## 2016-10-16 MED ORDER — OXYCODONE-ACETAMINOPHEN 5-325 MG PO TABS
1.0000 | ORAL_TABLET | Freq: Once | ORAL | Status: AC
  Administered 2016-10-16: 1 via ORAL
  Filled 2016-10-16: qty 1

## 2016-10-16 MED ORDER — IOPAMIDOL (ISOVUE-300) INJECTION 61%
INTRAVENOUS | Status: AC
  Administered 2016-10-16: 75 mL
  Filled 2016-10-16: qty 75

## 2016-10-16 MED ORDER — BUPIVACAINE-EPINEPHRINE (PF) 0.5% -1:200000 IJ SOLN
1.8000 mL | Freq: Once | INTRAMUSCULAR | Status: AC
  Administered 2016-10-16: 1.8 mL
  Filled 2016-10-16: qty 1.8

## 2016-10-16 MED ORDER — MORPHINE SULFATE (PF) 4 MG/ML IV SOLN
4.0000 mg | Freq: Once | INTRAVENOUS | Status: AC
  Administered 2016-10-16: 4 mg via INTRAVENOUS
  Filled 2016-10-16: qty 1

## 2016-10-16 NOTE — Discharge Instructions (Signed)
Continue taking your prescription of clindamycin as prescribed until completed. I also recommend applying a warm compress for 15 minutes 3-4 times daily. You may also gargle with warm water multiple times daily as needed for symptomatic relief. Follow-up with your dentist at your scheduled appointment in 2 days. Please return to the Emergency Department if symptoms worsen or new onset of fever, headache, neck stiffness, worsening facial/neck swelling, swelling of tongue, sensation of throat closing, drooling due to being unable to swallow, unable to open jaw, voice change, shortness of breath, vomiting, unable to keep fluids down.

## 2016-10-16 NOTE — ED Notes (Signed)
Patient transported to CT 

## 2016-10-16 NOTE — ED Notes (Signed)
Pt states he has facial swelling after dental abcess 2 weeks ago. States seen here yesterday and given ABX but returns due to increasing swelling in left eye.

## 2016-10-16 NOTE — ED Notes (Signed)
Pt states he understands instructions and will follow up as directed and return as needed.Home stable with steady gait with family.

## 2016-10-16 NOTE — ED Triage Notes (Signed)
Pt was seen here for dental infection yesterday. Pt had been taking amoxicillin and had run out for 1.5 weeks and was given different antibiotics.  When he woke up this am  The L side of his face and his eye were swollen.

## 2016-10-16 NOTE — ED Provider Notes (Signed)
[facility]ENCY DEPT Provider Note   CSN: [id number] Arrival date & time: 10/16/16  0901     History   Chief Complaint Chief Complaint  Patient presents with  . Facial Swelling    HPI Shane Guzman is a 40 y.o. male.  Patient is a 40 year old male with history of hypertension who presents the ED with complaints of worsening facial swelling with associated dental pain. Patient reports he has had pain to his left upper molar for over the past month. He notes the tooth recently chipped and states he has been seen by a dentist (Dr. Romie LeveeStacy Green). He reports he was initially evaluated by his dentist 3 weeks ago and was given a prescription of amoxicillin with plan of returning on 11/1 to have his to deep clean. He reports after finishing the antibiotic he continued to have pain and started having associated facial swelling. Patient states he was seen in the ED yesterday and started on clindamycin and given a rx of norco, he reports taking a total of 3 doses of the antibiotic but reports having worsening swelling. He states when he woke up this morning he noticed the swelling had progressed and states it is now present to the left side of his face extending to underneath his left eye. Endorses associated drainage. Denies fever, chills, drooling, trismus, voice change, shortness of breath, stridor, wheezing, vomiting. He reports having mild intermittent relief with Norco and ibuprofen.       Past Medical History:  Diagnosis Date  . Diverticulitis of intestine with abscess   . Heart murmur   . Hypertension     Patient Active Problem List   Diagnosis Date Noted  . Diverticulitis 06/13/2015  . Sigmoid diverticulitis 12/06/2011  . Diverticulitis of colon with perforation 11/22/2011  . TOBACCO USER 09/04/2009  . DIVERTICULAR DISEASE 11/18/2007  . MURMUR 11/18/2007    Past Surgical History:  Procedure Laterality Date  . ACHILLES TENDON REPAIR  06/2010   left; S/P tendon rupture        Home Medications    Prior to Admission medications   Medication Sig Start Date End Date Taking? Authorizing Provider  clindamycin (CLEOCIN) 150 MG capsule Take 2 capsules (300 mg total) by mouth 3 (three) times daily. 10/15/16  Yes Samantha Tripp Dowless, PA-C  HYDROcodone-acetaminophen (NORCO/VICODIN) 5-325 MG tablet Take 2 tablets by mouth every 4 (four) hours as needed. 10/15/16  Yes Samantha Tripp Dowless, PA-C  losartan-hydrochlorothiazide (HYZAAR) 100-25 MG tablet Take 1 tablet by mouth daily.   Yes Historical Provider, MD  amLODipine (NORVASC) 5 MG tablet Take 5 mg by mouth daily.    Historical Provider, MD  cyclobenzaprine (FLEXERIL) 5 MG tablet Take 1 tablet (5 mg total) by mouth 3 (three) times daily as needed for muscle spasms. Patient not taking: Reported on 10/16/2016 02/22/16   Shon Batonourtney F Horton, MD  ibuprofen (ADVIL,MOTRIN) 600 MG tablet Take 1 tablet (600 mg total) by mouth every 6 (six) hours as needed. 10/16/16   Barrett HenleNicole Elizabeth Nadeau, PA-C  naproxen (NAPROSYN) 500 MG tablet Take 1 tablet (500 mg total) by mouth 2 (two) times daily with a meal. Patient not taking: Reported on 10/16/2016 02/22/16   Shon Batonourtney F Horton, MD  oxyCODONE-acetaminophen (PERCOCET/ROXICET) 5-325 MG tablet Take 1 tablet by mouth every 4 (four) hours as needed. 10/16/16   Barrett HenleNicole Elizabeth Nadeau, PA-C    Family History Family History  Problem Relation Age of Onset  . Diverticulitis Maternal Grandmother     Social History Social History  Substance Use Topics  . Smoking status: Current Every Day Smoker    Packs/day: 0.00    Years: 18.00    Types: Cigarettes  . Smokeless tobacco: Never Used  . Alcohol use Yes     Allergies   Lisinopril   Review of Systems Review of Systems  HENT: Positive for dental problem and facial swelling.   All other systems reviewed and are negative.    Physical Exam Updated Vital Signs BP 133/85 (BP Location: Right Arm)   Pulse 100   Temp 98.7 F  (37.1 C) (Axillary)   Resp 22   Ht 5' 5.5" (1.664 m)   Wt 96.2 kg   SpO2 94%   BMI 34.74 kg/m   Physical Exam  Constitutional: He is oriented to person, place, and time. He appears well-developed and well-nourished.  HENT:  Head: Normocephalic and atraumatic.  Nose: Nose normal.  Mouth/Throat: Uvula is midline, oropharynx is clear and moist and mucous membranes are normal. No trismus in the jaw. Abnormal dentition. Dental abscesses present. No uvula swelling. No oropharyngeal exudate, posterior oropharyngeal edema, posterior oropharyngeal erythema or tonsillar abscesses. No tonsillar exudate.    Moderate swelling noted to left maxillary region extending to inferior periorbital region. Pt tolerating secretions, no trismus. Floor of mouth soft without swelling.  Eyes: Conjunctivae and EOM are normal. Right eye exhibits no discharge. Left eye exhibits no discharge. No scleral icterus.  Neck: Normal range of motion. Neck supple.  Cardiovascular: Normal rate, regular rhythm, normal heart sounds and intact distal pulses.   Pulmonary/Chest: Effort normal and breath sounds normal. No respiratory distress. He has no wheezes. He has no rales. He exhibits no tenderness.  Abdominal: Soft. He exhibits no distension.  Musculoskeletal: Normal range of motion.  Lymphadenopathy:    He has no cervical adenopathy.  Neurological: He is alert and oriented to person, place, and time.  Skin: Skin is warm and dry.  Nursing note and vitals reviewed.    ED Treatments / Results  Labs (all labs ordered are listed, but only abnormal results are displayed) Labs Reviewed  CBC WITH DIFFERENTIAL/PLATELET - Abnormal; Notable for the following:       Result Value   WBC 10.7 (*)    MCV 100.7 (*)    MCH 34.2 (*)    Neutro Abs 8.8 (*)    All other components within normal limits  BASIC METABOLIC PANEL - Abnormal; Notable for the following:    Glucose, Bld 160 (*)    Creatinine, Ser 1.30 (*)    All other  components within normal limits    EKG  EKG Interpretation None       Radiology Ct Maxillofacial W Contrast  Result Date: 10/16/2016 CLINICAL DATA:  Left facial swelling and dental pain. EXAM: CT MAXILLOFACIAL WITH CONTRAST TECHNIQUE: Multidetector CT imaging of the maxillofacial structures was performed with intravenous contrast. Multiplanar CT image reconstructions were also generated. A small metallic BB was placed on the right temple in order to reliably differentiate right from left. CONTRAST:  75mL ISOVUE-300 IOPAMIDOL (ISOVUE-300) INJECTION 61% COMPARISON:  None. FINDINGS: Osseous: There is a fracture of tooth #13 with periapical lucency consistent with an abscess. There is gas in soft tissues superficial to the maxilla at that point. There is also soft tissue swelling adjacent to that portion of the maxilla as well soft tissue swelling in the subcutaneous fat of the left side of face particularly adjacent to the left side of the mandible. There is also mucosal thickening in  the base of the left maxillary sinus which is felt to be secondary to the periapical abscess. Extensive dental caries of tooth #16. Orbits: Normal. Sinuses: Mucosal thickening in the left maxillary sinus due to extension of the periapical abscess into the sinus, best seen on image 33 of series 5. Soft tissues: Soft tissue swelling of the left side of the face, particularly along the left side of mandible. Limited intracranial: Normal. IMPRESSION: 1. Periapical abscess around tooth #13 with adjacent cellulitis in the left side of the face. Tooth 13 is fractured. 2. Severe dental caries at tooth 16. 3. Impacted bilateral mandibular third molars. Multiple missing teeth. Electronically Signed   By: Francene BoyersJames  Maxwell M.D.   On: 10/16/2016 13:06    Procedures .Marland Kitchen.Incision and Drainage Date/Time: 10/16/2016 3:11 PM Performed by: Barrett HenleNADEAU, NICOLE ELIZABETH Authorized by: Barrett HenleNADEAU, NICOLE ELIZABETH   Consent:    Consent obtained:   Verbal   Consent given by:  Patient Location:    Type:  Abscess (periapical abscess)   Location:  Mouth Anesthesia (see MAR for exact dosages):    Anesthesia method:  Local infiltration   Local anesthetic:  Bupivacaine 0.5% WITH epi Procedure type:    Complexity:  Simple Procedure details:    Incision types:  Single straight   Incision depth:  Dermal   Scalpel blade:  11   Wound management:  Irrigated with saline   Drainage:  Purulent and bloody   Drainage amount:  Moderate   Wound treatment:  Wound left open   Packing materials:  None Post-procedure details:    Patient tolerance of procedure:  Tolerated well, no immediate complications   (including critical care time)  Medications Ordered in ED Medications  bupivacaine-epinephrine (MARCAINE W/ EPI) 0.5% -1:200000 injection 1.8 mL (not administered)  iopamidol (ISOVUE-300) 61 % injection (75 mLs  Contrast Given 10/16/16 1104)  sodium chloride 0.9 % bolus 1,000 mL (0 mLs Intravenous Stopped 10/16/16 1400)  morphine 4 MG/ML injection 4 mg (4 mg Intravenous Given 10/16/16 1044)  oxyCODONE-acetaminophen (PERCOCET/ROXICET) 5-325 MG per tablet 1 tablet (1 tablet Oral Given 10/16/16 1400)     Initial Impression / Assessment and Plan / ED Course  I have reviewed the triage vital signs and the nursing notes.  Pertinent labs & imaging results that were available during my care of the patient were reviewed by me and considered in my medical decision making (see chart for details).  Clinical Course    Patient presents with worsening dental pain and associated facial swelling. He reports having dental pain for the past month, was initially treated with amoxicillin a few weeks ago and was reevaluated in the ED due to facial swelling. He reports taking 3 doses of clindamycin since being seen in the ED yesterday but reports having worsening facial swelling. He notes he has no appointment scheduled with his dentist on 11/1. VSS. Exam  revealed cracked tooth #13 with mild swelling, fluctuance and erythema to surrounding gingiva, tenderness palpation. Moderate facial swelling noted to left side. Floor of mouth soft, no trismus, patient tolerating secretions. No concern for Ludwig's angina. Due to patient with worsening facial swelling while being on second initiation of antibiotics will order CT maxillofacial with contrast for further evaluation. CT revealed periapical abscess around tooth #13 with adjacent cellulitis, tooth #13 fractured, severe dental. Noted to tooth #16. I&D performed in the ED without any competitions. Plan to discharge patient home with pain meds and advised to continue taking his prescription of clindamycin as prescribed until completed.  Advised patient to follow up with his dentist at his scheduled appointment in 2 days.  Final Clinical Impressions(s) / ED Diagnoses   Final diagnoses:  Dental abscess    New Prescriptions New Prescriptions   IBUPROFEN (ADVIL,MOTRIN) 600 MG TABLET    Take 1 tablet (600 mg total) by mouth every 6 (six) hours as needed.   OXYCODONE-ACETAMINOPHEN (PERCOCET/ROXICET) 5-325 MG TABLET    Take 1 tablet by mouth every 4 (four) hours as needed.     Satira Sark [place], [place] 10/16/16 1516    Pricilla Loveless, MD 10/25/16 [phone]

## 2016-10-16 NOTE — ED Notes (Signed)
Pt returns from CT.

## 2017-01-15 DIAGNOSIS — K5790 Diverticulosis of intestine, part unspecified, without perforation or abscess without bleeding: Secondary | ICD-10-CM | POA: Diagnosis not present

## 2017-01-15 DIAGNOSIS — K529 Noninfective gastroenteritis and colitis, unspecified: Secondary | ICD-10-CM | POA: Diagnosis not present

## 2017-01-15 DIAGNOSIS — G4733 Obstructive sleep apnea (adult) (pediatric): Secondary | ICD-10-CM | POA: Diagnosis not present

## 2017-01-15 DIAGNOSIS — J111 Influenza due to unidentified influenza virus with other respiratory manifestations: Secondary | ICD-10-CM | POA: Diagnosis not present

## 2017-02-01 ENCOUNTER — Encounter: Payer: Self-pay | Admitting: Internal Medicine

## 2017-02-06 DIAGNOSIS — G4733 Obstructive sleep apnea (adult) (pediatric): Secondary | ICD-10-CM | POA: Diagnosis not present

## 2017-02-28 DIAGNOSIS — R0683 Snoring: Secondary | ICD-10-CM | POA: Diagnosis not present

## 2017-02-28 DIAGNOSIS — G4710 Hypersomnia, unspecified: Secondary | ICD-10-CM | POA: Diagnosis not present

## 2017-02-28 DIAGNOSIS — G4733 Obstructive sleep apnea (adult) (pediatric): Secondary | ICD-10-CM | POA: Diagnosis not present

## 2017-03-05 DIAGNOSIS — A084 Viral intestinal infection, unspecified: Secondary | ICD-10-CM | POA: Diagnosis not present

## 2017-03-07 ENCOUNTER — Institutional Professional Consult (permissible substitution): Payer: Commercial Managed Care - HMO | Admitting: Neurology

## 2017-03-21 ENCOUNTER — Ambulatory Visit: Payer: Commercial Managed Care - HMO | Admitting: Internal Medicine

## 2017-03-22 ENCOUNTER — Telehealth: Payer: Self-pay | Admitting: *Deleted

## 2017-03-22 NOTE — Telephone Encounter (Signed)
No show letter mailed to patient. 

## 2017-08-07 DIAGNOSIS — Z9119 Patient's noncompliance with other medical treatment and regimen: Secondary | ICD-10-CM | POA: Diagnosis not present

## 2017-08-07 DIAGNOSIS — G4733 Obstructive sleep apnea (adult) (pediatric): Secondary | ICD-10-CM | POA: Diagnosis not present

## 2018-02-26 ENCOUNTER — Encounter (INDEPENDENT_AMBULATORY_CARE_PROVIDER_SITE_OTHER): Payer: Self-pay

## 2018-02-26 ENCOUNTER — Encounter: Payer: Self-pay | Admitting: Neurology

## 2018-02-26 ENCOUNTER — Ambulatory Visit: Payer: Managed Care, Other (non HMO) | Admitting: Neurology

## 2018-02-26 VITALS — BP 133/92 | HR 98 | Ht 66.0 in | Wt 219.0 lb

## 2018-02-26 DIAGNOSIS — G4733 Obstructive sleep apnea (adult) (pediatric): Secondary | ICD-10-CM

## 2018-02-26 DIAGNOSIS — I499 Cardiac arrhythmia, unspecified: Secondary | ICD-10-CM

## 2018-02-26 DIAGNOSIS — G4719 Other hypersomnia: Secondary | ICD-10-CM

## 2018-02-26 DIAGNOSIS — Z820 Family history of epilepsy and other diseases of the nervous system: Secondary | ICD-10-CM

## 2018-02-26 DIAGNOSIS — R0683 Snoring: Secondary | ICD-10-CM

## 2018-02-26 DIAGNOSIS — R011 Cardiac murmur, unspecified: Secondary | ICD-10-CM

## 2018-02-26 DIAGNOSIS — F17210 Nicotine dependence, cigarettes, uncomplicated: Secondary | ICD-10-CM

## 2018-02-26 DIAGNOSIS — E669 Obesity, unspecified: Secondary | ICD-10-CM

## 2018-02-26 NOTE — Progress Notes (Signed)
Subjective:    Patient ID: Shane Guzman is a 42 y.o. male.  HPI     Shane Foley, MD, PhD [facility] [address] [address] [place], [place] [address]  Dear Dr. Wylene Simmer,   I saw your patient, Shane Guzman, upon your kind request, in my neurologic clinic today for initial consultation of his sleep disorder, in particular, concern for underlying obstructive sleep apnea. The patient is unaccompanied today. As you know, Shane Guzman is a 42 year old right-handed gentleman with an underlying medical history of hypertension, history of heart murmur, diverticulosis, smoking and obesity, who reports snoring and excessive daytime somnolence as well as a prior diagnosis of obstructive sleep apnea. He no longer has a CPAP machine due to not being fully compliant as I understand. He does not report having had any problems using CPAP but he did not use it faithfully he admits. I reviewed your office note from 12/19/2017, which you kindly included. I reviewed his home sleep test report from 02/28/2017 which showed severe obstructive sleep apnea with an AHI of 46.6 per hour, O2 nadir of 72%. His DME company was [employer]. He was told by his DME company that he would have to repeat his sleep study to get back on CPAP therapy. He did notice an improvement of his sleep quality when on CPAP last year. He is in bed by 9, WT around 5 AM, no night to night nocturia, rare AM HAs, does have a FHx of OSA, in GM and uncle.  He lives with GF and 67 yo son. He works in [employer], since 9/18. He has noticed occasional palpitations. He was told in the past that he had a heart murmur. He drinks alcohol in the form of beer, typically on weekends. He does not smoke a cigarette every day and a pack may last 2 weeks. His Epworth sleepiness score is 10 out of 24, fatigue score is 34 out of 63.  His Past Medical History Is Significant For: Past Medical History:  Diagnosis Date  .  Diverticulitis of intestine with abscess   . Heart murmur   . Hypertension   . OSA on CPAP     His Past Surgical History Is Significant For: Past Surgical History:  Procedure Laterality Date  . ACHILLES TENDON REPAIR  06/2010   left; S/P tendon rupture    His Family History Is Significant For: Family History  Problem Relation Age of Onset  . Diverticulitis Maternal Grandmother     His Social History Is Significant For: Social History   Socioeconomic History  . Marital status: Single    Spouse name: None  . Number of children: None  . Years of education: None  . Highest education level: None  Social Needs  . Financial resource strain: None  . Food insecurity - worry: None  . Food insecurity - inability: None  . Transportation needs - medical: None  . Transportation needs - non-medical: None  Occupational History  . None  Tobacco Use  . Smoking status: Current Every Day Smoker    Packs/day: 0.00    Years: 18.00    Pack years: 0.00    Types: Cigarettes  . Smokeless tobacco: Never Used  Substance and Sexual Activity  . Alcohol use: Yes  . Drug use: No  . Sexual activity: Yes  Other Topics Concern  . None  Social History Narrative  . None    His Allergies Are:  Allergies  Allergen Reactions  .  Lisinopril Swelling  :   His Current Medications Are:  Outpatient Encounter Medications as of 02/26/2018  Medication Sig  . ASPIRIN PO Take by mouth.  Marland Kitchen ibuprofen (ADVIL,MOTRIN) 600 MG tablet Take 1 tablet (600 mg total) by mouth every 6 (six) hours as needed.  Marland Kitchen losartan-hydrochlorothiazide (HYZAAR) 100-25 MG tablet Take 1 tablet by mouth daily.  . [DISCONTINUED] amLODipine (NORVASC) 5 MG tablet Take 5 mg by mouth daily.  . [DISCONTINUED] clindamycin (CLEOCIN) 150 MG capsule Take 2 capsules (300 mg total) by mouth 3 (three) times daily.  . [DISCONTINUED] cyclobenzaprine (FLEXERIL) 5 MG tablet Take 1 tablet (5 mg total) by mouth 3 (three) times daily as needed for  muscle spasms. (Patient not taking: Reported on 10/16/2016)  . [DISCONTINUED] HYDROcodone-acetaminophen (NORCO/VICODIN) 5-325 MG tablet Take 2 tablets by mouth every 4 (four) hours as needed.  . [DISCONTINUED] naproxen (NAPROSYN) 500 MG tablet Take 1 tablet (500 mg total) by mouth 2 (two) times daily with a meal. (Patient not taking: Reported on 10/16/2016)  . [DISCONTINUED] oxyCODONE-acetaminophen (PERCOCET/ROXICET) 5-325 MG tablet Take 1 tablet by mouth every 4 (four) hours as needed.   No facility-administered encounter medications on file as of 02/26/2018.   :  Review of Systems:  Out of a complete 14 point review of systems, all are reviewed and negative with the exception of these symptoms as listed below: Review of Systems  Neurological:       Pt presents today to discuss his sleep. Pt has had a sleep study and cpap in the past but was non-compliant and must redo the process.  Epworth Sleepiness Scale 0= would never doze 1= slight chance of dozing 2= moderate chance of dozing 3= high chance of dozing  Sitting and reading: 3 Watching TV: 2 Sitting inactive in a public place (ex. Theater or meeting): 0 As a passenger in a car for an hour without a break: 1 Lying down to rest in the afternoon: 3 Sitting and talking to someone: 0 Sitting quietly after lunch (no alcohol): 1 In a car, while stopped in traffic: 0 Total: 10     Objective:  Neurological Exam  Physical Exam Physical Examination:   Vitals:   02/26/18 1558  BP: (!) 133/92  Pulse: 98    General Examination: The patient is a very pleasant 42 y.o. male in no acute distress. He appears well-developed and well-nourished and well groomed.   HEENT: Normocephalic, atraumatic, pupils are equal, round and reactive to light and accommodation. Extraocular tracking is good without limitation to gaze excursion or nystagmus noted. Normal smooth pursuit is noted. Hearing is grossly intact. Face is symmetric with normal facial  animation and normal facial sensation. Speech is clear with no dysarthria noted. There is no hypophonia. There is no lip, neck/head, jaw or voice tremor. Neck is supple with full range of passive and active motion. Oropharynx exam reveals: mild mouth dryness, adequate dental hygiene and marked airway crowding, due to tonsils of 3+ and large uvula. Mallampati is class III. Tongue protrudes centrally and palate elevates symmetrically. Neck size is 16 7/8.   Chest: Clear to auscultation without wheezing, rhonchi or crackles noted.  Heart: S1+S2+0, somewhat irregular, possibly frequent PVCs, not as irregularly irregular as in A. fib. He does have a systolic heart murmur.    Abdomen: Soft, non-tender and non-distended with normal bowel sounds appreciated on auscultation.  Extremities: There is no pitting edema in the distal lower extremities bilaterally. Pedal pulses are intact.  Skin: Warm and dry  without trophic changes noted.  Musculoskeletal: exam reveals no obvious joint deformities, tenderness or joint swelling or erythema.   Neurologically:  Mental status: The patient is awake, alert and oriented in all 4 spheres. His immediate and remote memory, attention, language skills and fund of knowledge are appropriate. There is no evidence of aphasia, agnosia, apraxia or anomia. Speech is clear with normal prosody and enunciation. Thought process is linear. Mood is normal and affect is normal.  Cranial nerves II - XII are as described above under HEENT exam. In addition: shoulder shrug is normal with equal shoulder height noted. Motor exam: Normal bulk, strength and tone is noted. There is no drift, tremor or rebound. Romberg is negative. Reflexes are 2+ throughout. Fine motor skills and coordination: intact with normal finger taps, normal hand movements, normal rapid alternating patting, normal foot taps and normal foot agility.  Cerebellar testing: No dysmetria or intention tremor on finger to nose  testing. Heel to shin is unremarkable bilaterally. There is no truncal or gait ataxia.  Sensory exam: intact to light touch in the upper and lower extremities.  Gait, station and balance: He stands easily. No veering to one side is noted. No leaning to one side is noted. Posture is age-appropriate and stance is narrow based. Gait shows normal stride length and normal pace. No problems turning are noted. Tandem walk is unremarkable.   Assessment and Plan:   In summary, Shane Guzman is a very pleasant 42 y.o.-year old male with an underlying medical history of hypertension, history of heart murmur, diverticulosis, smoking and obesity, whose history and physical exam are in keeping with severe obstructive sleep apnea (OSA). He has some irregularity noted on heart auscultation today as well as a heart murmur.  He is advised to proceed to the ER should he develop shortness of breath, chest pain or significant palpitations. I had a long chat with the patient about my findings and the diagnosis of OSA, its prognosis and treatment options. We talked about medical treatments, surgical interventions and non-pharmacological approaches. I explained in particular the risks and ramifications of untreated moderate to severe OSA, especially with respect to developing cardiovascular disease down the Road, including congestive heart failure, difficult to treat hypertension, cardiac arrhythmias, or stroke. Even type 2 diabetes has, in part, been linked to untreated OSA. Symptoms of untreated OSA include daytime sleepiness, memory problems, mood irritability and mood disorder such as depression and anxiety, lack of energy, as well as recurrent headaches, especially morning headaches. We talked about smoking cessation and trying to maintain a healthy lifestyle in general, as well as the importance of weight control. I encouraged the patient to eat healthy, exercise daily and keep well hydrated, to keep a scheduled bedtime and  wake time routine, to not skip any meals and eat healthy snacks in between meals. I advised the patient not to drive when feeling sleepy. I recommended the following at this time: sleep study with potential positive airway pressure titration. (We will score hypopneas at 4%).   I explained the sleep test procedure to the patient and also outlined possible surgical and non-surgical treatment options of OSA, including the use of a custom-made dental device (which would require a referral to a specialist dentist or oral surgeon), upper airway surgical options, such as pillar implants, radiofrequency surgery, tongue base surgery, and UPPP (which would involve a referral to an ENT surgeon). Rarely, jaw surgery such as mandibular advancement may be considered.  I also explained the CPAP treatment  option to the patient, who indicated that he would be willing to try CPAP if the need arises. I explained the importance of being compliant with PAP treatment, not only for insurance purposes but primarily to improve His symptoms, and for the patient's long term health benefit, including to reduce His cardiovascular risks. I answered all his questions today and the patient was in agreement. I would like to see him back after the sleep study is completed and encouraged him to call with any interim questions, concerns, problems or updates.   Thank you very much for allowing me to participate in the care of this nice patient. If I can be of any further assistance to you please do not hesitate to call me at [phone].  Sincerely,   Shane Foley, MD, PhD

## 2018-02-26 NOTE — Patient Instructions (Addendum)
Thank you for choosing [facility] for your sleep related care!  It was nice to meet you today! I appreciate that you entrust me with your sleep related healthcare concerns. I hope, I was able to address at least some of your concerns today, and that I can help you feel reassured and also get better.    Here is what we discussed today and what we came up with as our plan for you:    Based on your symptoms and your exam I believe you are at risk for obstructive sleep apnea or OSA, and I think we should proceed with a sleep study to determine whether you do or do not have OSA and how severe it is. If you have more than mild OSA, I want you to consider treatment with CPAP. Please remember, the risks and ramifications of moderate to severe obstructive sleep apnea or OSA are: Cardiovascular disease, including congestive heart failure, stroke, difficult to control hypertension, arrhythmias, and even type 2 diabetes has been linked to untreated OSA. Sleep apnea causes disruption of sleep and sleep deprivation in most cases, which, in turn, can cause recurrent headaches, problems with memory, mood, concentration, focus, and vigilance. Most people with untreated sleep apnea report excessive daytime sleepiness, which can affect their ability to drive. Please do not drive if you feel sleepy.   I will likely see you back after your sleep study to go over the test results and where to go from there. We will call you after your sleep study to advise about the results (most likely, you will hear from SteeleKristen, my nurse) and to set up an appointment at the time, as necessary.    Our sleep lab administrative assistant will call you to schedule your sleep study. If you don't hear back from her by about 2 weeks from now, please feel free to call her at [phone]. You can leave a message with your phone number and concerns, if you get the voicemail box. She will call back as soon as possible.   You may  have PVCs or extra heat beats, which appear to be quite frequent. If you have any other symptoms, such as chest pain, rapid heart or shortness of breath, you have to proceed to the ER. Please make sure that your EKG at Dr. Deneen Hartsisovec's office was okay.

## 2018-02-27 ENCOUNTER — Telehealth: Payer: Self-pay | Admitting: Neurology

## 2018-02-27 NOTE — Telephone Encounter (Signed)
Pt called he did not have EKG at PCP office last time he was seen. Pt will has an upcoming appt 4/4 with PCP and can have it done then. Please call to advise if it is ok to wait.

## 2018-02-27 NOTE — Telephone Encounter (Signed)
I spoke with Dr. Frances FurbishAthar. She recommends that pt call PCP and advise them that she noted some irregularities in his heart beat during his appt. It should be up to PCP on where to go from here, whether it is a sooner EKG, or if it is ok to wait until April. Pt will call PCP's office back and discuss.

## 2018-03-11 ENCOUNTER — Telehealth: Payer: Self-pay

## 2018-03-11 DIAGNOSIS — G4733 Obstructive sleep apnea (adult) (pediatric): Secondary | ICD-10-CM

## 2018-03-11 NOTE — Telephone Encounter (Signed)
[employer] denied in lab sleep study. They said we can use the results from the HST that was one in April 2018 and do an auto cpap. I did not see this study in Epic.Maybe it has been sent for scanning.

## 2018-03-11 NOTE — Telephone Encounter (Signed)
We will set patient up with autoPAP at home, as insurance denied in house split night study for OSA. We will use HST results from 3/18. Pls process order and notify patient and set up FU in 10 weeks with me or NP.

## 2018-03-11 NOTE — Telephone Encounter (Signed)
I have the HST results from 02/2017. Will send to Dr. Frances FurbishAthar for review.

## 2018-03-12 NOTE — Telephone Encounter (Signed)
I called pt. I advised pt that his insurance has denied an in lab study. Dr. Frances FurbishAthar recommends that pt start an auto pap at home. I reviewed PAP compliance expectations with the pt. Pt is agreeable to starting an auto-PAP. I advised pt that an order will be sent to a DME, [facility], and [facility] will call the pt within about one week after they file with the pt's insurance. [facility] will show the pt how to use the machine, fit for masks, and troubleshoot the auto-PAP if needed. A follow up appt was made for insurance purposes with Dr. Frances FurbishAthar on 06/18/18 at 3:00pm. Pt verbalized understanding to arrive 15 minutes early and bring their auto-PAP. A letter with all of this information in it will be mailed to the pt as a reminder. I verified with the pt that the address we have on file is correct. Pt verbalized understanding of results. Pt had no questions at this time but was encouraged to call back if questions arise.

## 2018-03-13 ENCOUNTER — Telehealth: Payer: Self-pay

## 2018-03-13 NOTE — Telephone Encounter (Signed)
Received notice from [facility] that before they proceed in getting pt an auto pap, pt's insurance is requiring the face to face visit from the sleep studies' ordering provider discussing the need for a sleep study. I believe pt saw Lindaann [facility]calScott Long, [facility] in Feb of 2018 before the sleep study was completed. I called [facility] to get these records but had to leave a message.  Stanton KidneyDebra, can you please follow up on this and attempt to get these records?

## 2018-05-29 ENCOUNTER — Telehealth: Payer: Self-pay | Admitting: Neurology

## 2018-05-29 DIAGNOSIS — Z6834 Body mass index (BMI) 34.0-34.9, adult: Secondary | ICD-10-CM | POA: Diagnosis not present

## 2018-05-29 DIAGNOSIS — M545 Low back pain: Secondary | ICD-10-CM | POA: Diagnosis not present

## 2018-05-29 DIAGNOSIS — I10 Essential (primary) hypertension: Secondary | ICD-10-CM | POA: Diagnosis not present

## 2018-05-29 NOTE — Telephone Encounter (Signed)
I called pt. He has changed insurances, wants to know if he can start auto pap. I will send pt's insurance info to [facility] again and see if this will work. We may not need to the notes prior to sleep study. Pt verbalized understanding.

## 2018-05-29 NOTE — Telephone Encounter (Signed)
Pt has called to inform that he has new insurance and would now like to move forward with the auto pap Insurance Name:[employer] [facility] ZO:[id number] Group#[id number] RX [phone]#[id number] Pt is asking for a call re: what can be done while he waits for his appointment to see Dr Frances FurbishAthar

## 2018-05-31 DIAGNOSIS — M545 Low back pain: Secondary | ICD-10-CM | POA: Diagnosis not present

## 2018-05-31 DIAGNOSIS — Z6834 Body mass index (BMI) 34.0-34.9, adult: Secondary | ICD-10-CM | POA: Diagnosis not present

## 2018-06-18 ENCOUNTER — Ambulatory Visit: Payer: Self-pay | Admitting: Neurology

## 2018-08-22 ENCOUNTER — Ambulatory Visit: Payer: Managed Care, Other (non HMO) | Admitting: Neurology

## 2018-10-02 ENCOUNTER — Encounter

## 2018-10-02 ENCOUNTER — Ambulatory Visit: Payer: Self-pay | Admitting: Neurology

## 2018-10-23 NOTE — Telephone Encounter (Signed)
Received this note from [facility]: "No ma'am he no showed 2 appointments, we reached out to reschedule and he told us he would call us back to reschedule."

## 2018-10-23 NOTE — Telephone Encounter (Signed)
I called pt to ask if he started auto pap. No answer, left a message asking him to call me back.

## 2018-10-23 NOTE — Telephone Encounter (Signed)
Received this notice from [facility]: "We got him scheudled for 11/14 at 3pm."

## 2018-10-23 NOTE — Telephone Encounter (Signed)
Pt has an appt tomorrow with Dr. Frances Furbish but I cannot find his auto pap data. I reached out to [facility] to find out if pt started auto pap or not.

## 2018-10-23 NOTE — Telephone Encounter (Signed)
Pt returning RNs call, stating he has not received his autopap yet, r/s appt for 2/11 also provided [facility] number for pt to call.

## 2018-10-23 NOTE — Telephone Encounter (Signed)
Received an OV note from 08/07/17 from [facility]. I checked with [facility] and they don't need this note for [employer].

## 2018-10-23 NOTE — Telephone Encounter (Signed)
I request medical records from Dr Lindaann Pascal office. Pt records will be faxed over today.

## 2018-10-23 NOTE — Telephone Encounter (Signed)
I have asked [facility] to reach out to him again.

## 2018-10-24 ENCOUNTER — Ambulatory Visit: Payer: Self-pay | Admitting: Neurology

## 2018-10-24 DIAGNOSIS — I10 Essential (primary) hypertension: Secondary | ICD-10-CM | POA: Diagnosis not present

## 2018-10-24 DIAGNOSIS — R140 Abdominal distension (gaseous): Secondary | ICD-10-CM | POA: Diagnosis not present

## 2018-10-24 DIAGNOSIS — K921 Melena: Secondary | ICD-10-CM | POA: Diagnosis not present

## 2018-10-30 NOTE — Telephone Encounter (Signed)
Received this notice from [facility]: "No show x 4 appts Scheduled on 6/20, 6/25, 8/9 and 11/13."  I called pt to discuss. No answer, left a message asking him to call me back.

## 2018-10-31 NOTE — Telephone Encounter (Signed)
I called pt. I advised him that [facility] informed me that he has missed 4 appts with them. Pt reports that he understands that he needs to start the cpap machine. Pt says that he "forgot" all 4 appts because [facility] did not send him a reminder email or call him the day before his appt. Pt asked me to have [facility] call him again.

## 2018-10-31 NOTE — Telephone Encounter (Signed)
Pt returning RN's call.

## 2018-11-06 DIAGNOSIS — G4733 Obstructive sleep apnea (adult) (pediatric): Secondary | ICD-10-CM | POA: Diagnosis not present

## 2018-11-28 ENCOUNTER — Other Ambulatory Visit: Payer: Self-pay

## 2018-11-28 ENCOUNTER — Emergency Department (HOSPITAL_COMMUNITY)
Admission: EM | Admit: 2018-11-28 | Discharge: 2018-11-28 | Disposition: A | Discharge status: Home / Self Care | Payer: 59 | Attending: Emergency Medicine | Admitting: Emergency Medicine

## 2018-11-28 DIAGNOSIS — Z7982 Long term (current) use of aspirin: Secondary | ICD-10-CM | POA: Diagnosis not present

## 2018-11-28 DIAGNOSIS — F17210 Nicotine dependence, cigarettes, uncomplicated: Secondary | ICD-10-CM | POA: Insufficient documentation

## 2018-11-28 DIAGNOSIS — I10 Essential (primary) hypertension: Secondary | ICD-10-CM | POA: Insufficient documentation

## 2018-11-28 DIAGNOSIS — M7918 Myalgia, other site: Secondary | ICD-10-CM

## 2018-11-28 DIAGNOSIS — M542 Cervicalgia: Secondary | ICD-10-CM | POA: Insufficient documentation

## 2018-11-28 DIAGNOSIS — Z79899 Other long term (current) drug therapy: Secondary | ICD-10-CM | POA: Diagnosis not present

## 2018-11-28 DIAGNOSIS — M545 Low back pain: Secondary | ICD-10-CM | POA: Diagnosis not present

## 2018-11-28 MED ORDER — IBUPROFEN 600 MG PO TABS: 600.0000 mg | ORAL_TABLET | Freq: Four times a day (QID) | ORAL | 0 refills | Status: DC | PRN

## 2018-11-28 MED ORDER — CYCLOBENZAPRINE HCL 10 MG PO TABS: 10.0000 mg | ORAL_TABLET | Freq: Two times a day (BID) | ORAL | 0 refills | Status: DC | PRN
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
## 2018-12-06 DIAGNOSIS — G4733 Obstructive sleep apnea (adult) (pediatric): Secondary | ICD-10-CM | POA: Diagnosis not present

## 2019-01-06 DIAGNOSIS — G4733 Obstructive sleep apnea (adult) (pediatric): Secondary | ICD-10-CM | POA: Diagnosis not present

## 2019-01-23 DIAGNOSIS — R82998 Other abnormal findings in urine: Secondary | ICD-10-CM | POA: Diagnosis not present

## 2019-01-23 DIAGNOSIS — Z125 Encounter for screening for malignant neoplasm of prostate: Secondary | ICD-10-CM | POA: Diagnosis not present

## 2019-01-23 DIAGNOSIS — Z0000 Encounter for general adult medical examination without abnormal findings: Secondary | ICD-10-CM | POA: Diagnosis not present

## 2019-01-26 ENCOUNTER — Encounter: Payer: Self-pay | Admitting: Neurology

## 2019-01-28 ENCOUNTER — Ambulatory Visit: Payer: 59 | Admitting: Neurology

## 2019-01-28 ENCOUNTER — Encounter: Payer: Self-pay | Admitting: Neurology

## 2019-01-28 VITALS — BP 150/88 | HR 94 | Ht 65.0 in | Wt 219.0 lb

## 2019-01-28 DIAGNOSIS — Z9989 Dependence on other enabling machines and devices: Secondary | ICD-10-CM | POA: Diagnosis not present

## 2019-01-28 DIAGNOSIS — G4733 Obstructive sleep apnea (adult) (pediatric): Secondary | ICD-10-CM | POA: Diagnosis not present

## 2019-01-28 NOTE — Patient Instructions (Addendum)
Please continue using your autoPAP regularly. While your insurance requires that you use PAP at least 4 hours each night on 70% of the nights, I recommend, that you not skip any nights and use it throughout the night if you can. Getting used to PAP and staying with the treatment long term does take time and patience and discipline. Untreated obstructive sleep apnea when it is moderate to severe can have an adverse impact on cardiovascular health and raise her risk for heart disease, arrhythmias, hypertension, congestive heart failure, stroke and diabetes. Untreated obstructive sleep apnea causes sleep disruption, nonrestorative sleep, and sleep deprivation. This can have an impact on your day to day functioning and cause daytime sleepiness and impairment of cognitive function, memory loss, mood disturbance, and problems focussing. Using PAP regularly can improve these symptoms.   Please call [facility] for more information about the insurance side of things, fulfilling compliance criteria.   Please call our office or email [place] through My Chart in about 2 weeks, so I can look at another download from your machine.   You are almost there with your compliance. Keep up the good.

## 2019-01-28 NOTE — Progress Notes (Signed)
Subjective:    Patient ID: Shane Guzman is a 43 y.o. male.  HPI     Interim history:   Shane Guzman is a 43 year old right-handed gentleman with an underlying medical history of hypertension, history of heart murmur, diverticulosis, smoking and obesity, who presents for follow-up consultation of his obstructive sleep apnea. The patient is unaccompanied today. I first met him on 02/26/2018, at which time he had a prior diagnosis of OSA. He had a home sleep testing through his primary care physician's office which indicated severe sleep apnea. I prescribed an AutoPap machine for him. He did not get set up to later in the year.  Today, 01/28/2018: I reviewed his AutoPap compliance data from the last 30 days from 12/28/2018 through 01/26/2019, during which time he used his AutoPap 29 days with percent used days greater than 4 hours at 63%, indicating slightly suboptimal compliance with an average usage of 4 hours and 4 minutes, residual AHI 2.9 per hour, 95th percentile pressure at 14.6 cm, leaked on the higher and with the 95th percentile at 20 L/m on a pressure range of 5 cm to 17 cm with EPR. His setup date was 11/06/2018, his overall compliance since set up for more than 4 hours has been less, 48% only. He reports feeling better when he uses his AutoPap. He is motivated to continue with treatment and knows that he is slightly suboptimal with his overall compliance thus far. His Epworth sleepiness score is 7 out of 24 today, previously was 10 out of 24 at our first visit. He is using one of the newer nasal cradle or cushion interfaces from [employer]. He tries to hydrate well with water. He is motivated to continue with treatment. He had a cold recently and was not able to use his machine consistently at the time.   The patient's allergies, current medications, family history, past medical history, past social history, past surgical history and problem list were reviewed and updated as appropriate.    Previously:   02/26/2018: (He) reports snoring and excessive daytime somnolence as well as a prior diagnosis of obstructive sleep apnea. He no longer has a CPAP machine due to not being fully compliant as I understand. He does not report having had any problems using CPAP but he did not use it faithfully he admits. I reviewed your office note from 12/19/2017, which you kindly included. I reviewed his home sleep test report from 02/28/2017 which showed severe obstructive sleep apnea with an AHI of 46.6 per hour, O2 nadir of 72%. His DME company was [employer]. He was told by his DME company that he would have to repeat his sleep study to get back on CPAP therapy. He did notice an improvement of his sleep quality when on CPAP last year. He is in bed by 9, WT around 5 AM, no night to night nocturia, rare AM HAs, does have a FHx of OSA, in GM and uncle.  He lives with GF and 49 yo son. He works in [employer], since 9/18. He has noticed occasional palpitations. He was told in the past that he had a heart murmur. He drinks alcohol in the form of beer, typically on weekends. He does not smoke a cigarette every day and a pack may last 2 weeks. His Epworth sleepiness score is 10 out of 24, fatigue score is 34 out of 63.   His Past Medical History Is Significant For: Past Medical History:  Diagnosis Date  . Diverticulitis of  intestine with abscess   . Heart murmur   . Hypertension   . OSA on CPAP     His Past Surgical History Is Significant For: Past Surgical History:  Procedure Laterality Date  . ACHILLES TENDON REPAIR  06/2010   left; S/P tendon rupture    His Family History Is Significant For: Family History  Problem Relation Age of Onset  . Diverticulitis Maternal Grandmother     His Social History Is Significant For: Social History   Socioeconomic History  . Marital status: Single    Spouse name: Not on file  . Number of children: Not on file  . Years of education: Not on file   . Highest education level: Not on file  Occupational History  . Not on file  Social Needs  . Financial resource strain: Not on file  . Food insecurity:    Worry: Not on file    Inability: Not on file  . Transportation needs:    Medical: Not on file    Non-medical: Not on file  Tobacco Use  . Smoking status: Current Every Day Smoker    Packs/day: 0.00    Years: 18.00    Pack years: 0.00    Types: Cigarettes  . Smokeless tobacco: Never Used  Substance and Sexual Activity  . Alcohol use: Yes  . Drug use: No  . Sexual activity: Yes  Lifestyle  . Physical activity:    Days per week: Not on file    Minutes per session: Not on file  . Stress: Not on file  Relationships  . Social connections:    Talks on phone: Not on file    Gets together: Not on file    Attends religious service: Not on file    Active member of club or organization: Not on file    Attends meetings of clubs or organizations: Not on file    Relationship status: Not on file  Other Topics Concern  . Not on file  Social History Narrative  . Not on file    His Allergies Are:  Allergies  Allergen Reactions  . Lisinopril Swelling  :   His Current Medications Are:  Outpatient Encounter Medications as of 01/28/2019  Medication Sig  . ASPIRIN PO Take by mouth.  . cyclobenzaprine (FLEXERIL) 10 MG tablet Take 1 tablet (10 mg total) by mouth 2 (two) times daily as needed for muscle spasms.  Marland Kitchen ibuprofen (ADVIL,MOTRIN) 600 MG tablet Take 1 tablet (600 mg total) by mouth every 6 (six) hours as needed.  . valsartan-hydrochlorothiazide (DIOVAN-HCT) 160-12.5 MG tablet Take 1 tablet by mouth daily.  . [DISCONTINUED] losartan-hydrochlorothiazide (HYZAAR) 100-25 MG tablet Take 1 tablet by mouth daily.   No facility-administered encounter medications on file as of 01/28/2019.   :  Review of Systems:  Out of a complete 14 point review of systems, all are reviewed and negative with the exception of these symptoms as  listed below: Review of Systems  Neurological:       Pt presents today to discuss his auto pap. Pt reports that he does feel better when he uses it.  Epworth Sleepiness Scale 0= would never doze 1= slight chance of dozing 2= moderate chance of dozing 3= high chance of dozing  Sitting and reading: 3 Watching TV: 1 Sitting inactive in a public place (ex. Theater or meeting):0 As a passenger in a car for an hour without a break: 0 Lying down to rest in the afternoon: 3 Sitting and talking  to someone: 0 Sitting quietly after lunch (no alcohol): 0 In a car, while stopped in traffic: 0 Total: 7     Objective:  Neurological Exam  Physical Exam Physical Examination:   Vitals:   01/28/19 1253  BP: (!) 150/88  Pulse: 94   General Examination: The patient is a very pleasant 43 y.o. male in no acute distress. He appears well-developed and well-nourished and adequately groomed.   HEENT: Normocephalic, atraumatic, pupils are equal, round and reactive to light. Corrective eyeglasses in place. Extraocular tracking is good without limitation to gaze excursion or nystagmus noted. Normal smooth pursuit is noted. Hearing is grossly intact. Face is symmetric with normal facial animation and normal facial sensation. Speech is clear with no dysarthria noted. There is no hypophonia. There is no lip, neck/head, jaw or voice tremor. Neck with FROM. Oropharynx exam reveals: mild mouth dryness, adequate dental hygiene and marked airway crowding. Tongue protrudes centrally and palate elevates symmetrically.    Chest: Clear to auscultation without wheezing, rhonchi or crackles noted.  Heart: S1+S2+0, regular, and still has a mild systolic heart murmur.    Abdomen: Soft, non-tender and non-distended.  Extremities: There is no pitting edema in the distal lower extremities bilaterally.   Skin: Warm and dry without trophic changes noted.  Musculoskeletal: exam reveals no obvious joint  deformities, tenderness or joint swelling or erythema.   Neurologically:  Mental status: The patient is awake, alert and oriented in all 4 spheres. His immediate and remote memory, attention, language skills and fund of knowledge are appropriate. There is no evidence of aphasia, agnosia, apraxia or anomia. Speech is clear with normal prosody and enunciation. Thought process is linear. Mood is normal and affect is normal.  Cranial nerves II - XII are as described above under HEENT exam.  Motor exam: Normal bulk, strength and tone is noted. There is no tremor. Romberg is negative. Fine motor skills and coordination: intact with normal finger taps, normal hand movements, normal rapid alternating patting, normal foot taps and normal foot agility.  Cerebellar testing: No dysmetria or intention tremor on finger to nose testing. Heel to shin is unremarkable bilaterally. There is no truncal or gait ataxia.  Sensory exam: intact to light touch in the upper and lower extremities.  Gait, station and balance: He stands easily. No veering to one side is noted. No leaning to one side is noted. Posture is age-appropriate and stance is narrow based. Gait shows normal stride length and normal pace. No problems turning are noted. Tandem walk is unremarkable.   Assessment and Plan:   In summary, Shane Guzman is a very pleasant 43 year old male with an underlying medical history of hypertension, history of heart murmur, diverticulosis, smoking and obesity, who presents for follow-up consultation of his obstructive sleep apnea which was determined to be in the severe range by home sleep testing in March 2018 through an outside facility. He has established treatment with AutoPap in November 2019. He has improved his compliance but had a recent setback with a cold which made using AutoPap difficult, understandably. He is motivated to continue with treatment, he has benefited from treating his sleep apnea and that he is  better rested and less tired. Epworth sleepiness score is also less than before. He is advised that he will need to demonstrate full compliance for ongoing coverage of his machine. He is advised to call our office in 2 weeks for another updated compliance download. He can follow-up in the office routinely in  about 6 months with one of our nurse practitioners. I again explained the risks and ramifications of untreated moderate to severe OSA, especially with respect to developing cardiovascular disease. I again explained the importance of being compliant with PAP treatment, not only for insurance purposes but primarily to improve His symptoms, and for the patient's long term health benefit, including to reduce His cardiovascular risks.  I answered all his questions today and the patient was in agreement.  I spent 20 minutes in total face-to-face time with the patient, more than 50% of which was spent in counseling and coordination of care, reviewing test results, reviewing medication and discussing or reviewing the diagnosis of OSA, its prognosis and treatment options. Pertinent laboratory and imaging test results that were available during this visit with the patient were reviewed by me and considered in my medical decision making (see chart for details).

## 2019-02-06 DIAGNOSIS — G4733 Obstructive sleep apnea (adult) (pediatric): Secondary | ICD-10-CM | POA: Diagnosis not present

## 2019-02-28 DIAGNOSIS — G4733 Obstructive sleep apnea (adult) (pediatric): Secondary | ICD-10-CM | POA: Diagnosis not present

## 2019-02-28 DIAGNOSIS — I10 Essential (primary) hypertension: Secondary | ICD-10-CM | POA: Diagnosis not present

## 2019-02-28 DIAGNOSIS — Z0000 Encounter for general adult medical examination without abnormal findings: Secondary | ICD-10-CM | POA: Diagnosis not present

## 2019-02-28 DIAGNOSIS — Z720 Tobacco use: Secondary | ICD-10-CM | POA: Diagnosis not present

## 2019-03-07 DIAGNOSIS — G4733 Obstructive sleep apnea (adult) (pediatric): Secondary | ICD-10-CM | POA: Diagnosis not present

## 2019-03-20 DIAGNOSIS — G4733 Obstructive sleep apnea (adult) (pediatric): Secondary | ICD-10-CM | POA: Diagnosis not present

## 2019-03-31 ENCOUNTER — Telehealth: Payer: Self-pay | Admitting: Neurology

## 2019-03-31 NOTE — Telephone Encounter (Signed)
Pt has DMV Form that needs to be filled out and he wants to come into the office to do so , educated pt on pandemic procedure that were are following he still insist on coming in office.

## 2019-03-31 NOTE — Telephone Encounter (Signed)
Pt going to faxed DMV form in today.

## 2019-04-07 DIAGNOSIS — G4733 Obstructive sleep apnea (adult) (pediatric): Secondary | ICD-10-CM | POA: Diagnosis not present

## 2019-05-07 DIAGNOSIS — G4733 Obstructive sleep apnea (adult) (pediatric): Secondary | ICD-10-CM | POA: Diagnosis not present

## 2019-07-15 ENCOUNTER — Telehealth: Payer: Self-pay | Admitting: Neurology

## 2019-07-15 NOTE — Telephone Encounter (Signed)
Pt needs a letter describing his cpap compliance sent to [facility]. He needs compliance reviewed from April to July of 2020.

## 2019-07-15 NOTE — Telephone Encounter (Signed)
Pt is going to call the [facility] office to have them to fax a form here.

## 2019-07-15 NOTE — Telephone Encounter (Signed)
Pt states he is needing the cpap compliance of the last two months and a letter stating that he is using the cpap faxed again. Pt did not have the fax number on him but he says that it is the same number that was used before. Pt states that the deadline is August 4th. Please advise.

## 2019-07-15 NOTE — Telephone Encounter (Signed)
I called pt, spent 11 minutes discussing this with him. He would prefer to wait until 07/21/2019 and redo his compliance download then to increase his compliance numbers. He will use his auto pap every night for at least 4 hours. The letter can be generated on 07/21/19 and faxed to the [facility]. Pt reports that he asked for an extension for the DMV but they denied his request. He got a new mask that is more comfortable and been able to use his auto pap more lately.  Pt's appt on 8/12 was also rescheduled for 8/19. Pt verbalized understanding of new appt date and time.

## 2019-07-15 NOTE — Telephone Encounter (Signed)
Pt is needing the cpap compliance for April,May, June July and letter stating that he has been using his machine fax to [phone].

## 2019-07-15 NOTE — Telephone Encounter (Signed)
As requested, please write a letter: To whom it may concern:  I reviewed Mr. Shane Guzman' autoPAP compliance data from 03/19/2019 through 07/14/2019 which is a total of 118 days, during which time he used his AutoPap 63 days which is 53% of the total days, with percent used days greater than 4 hours at 32%, indicating significantly suboptimal compliance with an average usage of 3 hours and 37 minutes for days on treatment.  His average AHI was at goal at 3.6/h, 95th percentile of pressure at 11.7 cm with a range of 5 cm minimum, maximum of 17 cm with EPR, leak in the acceptable Range with the 95th percentile at 14.1 L/min. Please advise patient regarding his suboptimal compliance and fax letter as requested.

## 2019-07-21 NOTE — Telephone Encounter (Signed)
Pt returned call and wanted to know if his letter can be faxed over to [facility] his deadline is tomorrow.

## 2019-07-21 NOTE — Telephone Encounter (Signed)
I called pt. I advised him that his form/letter was faxed to the [facility]. Pt verbalized understanding.

## 2019-07-21 NOTE — Telephone Encounter (Signed)
I reviewed Mr Shane Guzman' AutoPap compliance data from the past 30 days, from 06/21/2019 through 07/20/2019, during which time he used his AutoPap 23 days with percent used days greater than 4 hours at 60% which is a little better lately.  The overall compliance for usage is 77%, the compliance for more than 4 hours is mildly suboptimal at 60%.  Average usage is 4 hours and 21 minutes for days on treatment, average AHI at goal at 1.7 and average pressure at 11.2 cm for the 95th percentile, leak acceptable with a 95th percentile at 9.2 L/min.  In the past 124 days from 03/19/2019 through 07/20/2019 his usage percentage is 55 and percent used days greater than 4 hours at 34%.  Clearly, in the past 6 weeks his compliance with the AutoPap has increased significantly. He has been reminded to be fully compliant with treatment and is motivated to continue therapy. Please provide a letter to be faxed to the [facility] as per patient request.

## 2019-07-30 ENCOUNTER — Ambulatory Visit: Payer: 59 | Admitting: Neurology

## 2019-08-06 ENCOUNTER — Encounter: Payer: Self-pay | Admitting: Neurology

## 2019-08-06 ENCOUNTER — Other Ambulatory Visit: Payer: Self-pay

## 2019-08-06 ENCOUNTER — Ambulatory Visit: Payer: 59 | Admitting: Neurology

## 2019-08-06 VITALS — BP 153/115 | HR 80 | Ht 65.5 in | Wt 211.0 lb

## 2019-08-06 DIAGNOSIS — Z9989 Dependence on other enabling machines and devices: Secondary | ICD-10-CM

## 2019-08-06 DIAGNOSIS — G4733 Obstructive sleep apnea (adult) (pediatric): Secondary | ICD-10-CM | POA: Diagnosis not present

## 2019-08-06 NOTE — Patient Instructions (Signed)
As discussed, we will switch you from AutoPap to a CPAP of 12 cm, perhaps the set pressure will help you feel more comfortable and tolerate the treatment better.  Please be consistent with the usage, you have done a much better job lately. Please continue using your CPAP regularly. While your insurance requires that you use CPAP at least 4 hours each night on 70% of the nights, I recommend, that you not skip any nights and use it throughout the night if you can. Getting used to CPAP and staying with the treatment long term does take time and patience and discipline. Untreated obstructive sleep apnea when it is moderate to severe can have an adverse impact on cardiovascular health and raise her risk for heart disease, arrhythmias, hypertension, congestive heart failure, stroke and diabetes. Untreated obstructive sleep apnea causes sleep disruption, nonrestorative sleep, and sleep deprivation. This can have an impact on your day to day functioning and cause daytime sleepiness and impairment of cognitive function, memory loss, mood disturbance, and problems focussing. Using CPAP regularly can improve these symptoms.  Please continue to work on weight loss as weight loss can improve your sleep apnea severity.  Follow-up in 6 months to see the nurse practitioner Megan.    Please also call our office in about 1 month so we can pull another compliance data for the month and see if you are doing better on the set pressure of 12 cm as opposed to AutoPap.  Please let us know sooner if you have any problems tolerating the CPAP.

## 2019-08-06 NOTE — Progress Notes (Signed)
Subjective:    Patient ID: Shane Guzman is a 43 y.o. male.  HPI     Interim history:   Shane Guzman is a 43 year old right-handed gentleman with an underlying medical history of hypertension, history of heart murmur, diverticulosis, smoking and obesity, who presents for follow-up consultation of his obstructive sleep apnea. The patient is unaccompanied today. I last saw him on 01/28/2019, at which time he was slightly suboptimal with his AutoPap compliance, AHI was at goal, leak acceptable.  He was encouraged to be fully compliant with his treatment.  He reported good results with AutoPap therapy and he was motivated to continue with treatment.  His daytime sleepiness had improved.   He recently requested a letter to be sent to the [facility] with his compliance.  Today, 08/06/2019: I reviewed his AutoPap compliance data from 07/06/2019 through 08/04/2019 which is a total of 30 days, during which time he used his AutoPap 24 days with percent used days greater than 4 hours at 60%, indicating mildly suboptimal compliance with an average usage of 3 hours and 53 minutes for days on treatment, residual AHI at goal at 3.6/h, 95th percentile of pressure at 11.7 cm with a range of 5 cm to 17 cm with EPR, leak acceptable with a 95th percentile at 14 L/min.  In the past 90 days his compliance was overall a little less even for percent used days greater than 4 hours at 43%.  His compliance has improved since mid June. He reports doing better with his AutoPap, he is highly motivated to continue.  He is less sleepy during the day.  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  He is working on weight loss.  He admits that he has skipped some nights as far as the AutoPap.  He would be willing to try a set pressure of 12 cm.  He is comfortable with his current nasal interface  which is a nasal cushion type of mask, he does not want to try nasal pillows.  He is up-to-date with his supplies but may not have changed the filter on regular basis.    The patient's allergies, current medications, family history, past medical history, past social history, past surgical history and problem list were reviewed and updated as appropriate.    Previously:   I first met him on 02/26/2018, at which time he had a prior diagnosis of OSA. He had a home sleep testing through his primary care physician's office which indicated severe sleep apnea. I prescribed an AutoPap machine for him. He did not get set up to later in the year.   I reviewed his AutoPap compliance data from the last 30 days from 12/28/2018 through 01/26/2019, during which time he used his AutoPap 29 days with percent used days greater than 4 hours at 63%, indicating slightly suboptimal compliance with an average usage of 4 hours and 4 minutes, residual AHI 2.9 per hour, 95th percentile pressure at 14.6 cm, leaked on the higher and with the 95th percentile at 20 L/m on a pressure range of 5 cm to 17 cm with EPR. His setup date was 11/06/2018, his overall compliance since set up for more than 4 hours has been less, 48% only.    02/26/2018: (He) reports snoring and excessive daytime somnolence as well as a  prior diagnosis of obstructive sleep apnea. He no longer has a CPAP machine due to not being fully compliant as I understand. He does not report having had any problems using CPAP but he did not use it faithfully he admits. I reviewed your office note from 12/19/2017, which you kindly included. I reviewed his home sleep test report from 02/28/2017 which showed severe obstructive sleep apnea with an AHI of 46.6 per hour, O2 nadir of 72%. His DME company was [employer]. He was told by his DME company that he would have to repeat his sleep study to get back on CPAP therapy. He did notice an improvement of his sleep quality when on CPAP  last year. He is in bed by 9, WT around 5 AM, no night to night nocturia, rare AM HAs, does have a FHx of OSA, in GM and uncle.  He lives with GF and 39 yo son. He works in [employer], since 9/18. He has noticed occasional palpitations. He was told in the past that he had a heart murmur. He drinks alcohol in the form of beer, typically on weekends. He does not smoke a cigarette every day and a pack may last 2 weeks. His Epworth sleepiness score is 10 out of 24, fatigue score is 34 out of 63.   His Past Medical History Is Significant For: Past Medical History:  Diagnosis Date  . Diverticulitis of intestine with abscess   . Heart murmur   . Hypertension   . OSA on CPAP     His Past Surgical History Is Significant For: Past Surgical History:  Procedure Laterality Date  . ACHILLES TENDON REPAIR  06/2010   left; S/P tendon rupture    His Family History Is Significant For: Family History  Problem Relation Age of Onset  . Diverticulitis Maternal Grandmother     His Social History Is Significant For: Social History   Socioeconomic History  . Marital status: Single    Spouse name: Not on file  . Number of children: Not on file  . Years of education: Not on file  . Highest education level: Not on file  Occupational History  . Not on file  Social Needs  . Financial resource strain: Not on file  . Food insecurity    Worry: Not on file    Inability: Not on file  . Transportation needs    Medical: Not on file    Non-medical: Not on file  Tobacco Use  . Smoking status: Current Every Day Smoker    Packs/day: 0.00    Years: 18.00    Pack years: 0.00    Types: Cigarettes  . Smokeless tobacco: Never Used  Substance and Sexual Activity  . Alcohol use: Yes  . Drug use: No  . Sexual activity: Yes  Lifestyle  . Physical activity    Days per week: Not on file    Minutes per session: Not on file  . Stress: Not on file  Relationships  . Social Herbalist on  phone: Not on file    Gets together: Not on file    Attends religious service: Not on file    Active member of club or organization: Not on file    Attends meetings of clubs or organizations: Not on file    Relationship status: Not on file  Other Topics Concern  . Not on file  Social History Narrative  . Not on file    His Allergies Are:  Allergies  Allergen Reactions  . Lisinopril Swelling  :   His Current Medications Are:  Outpatient Encounter Medications as of 08/06/2019  Medication Sig  . valsartan-hydrochlorothiazide (DIOVAN-HCT) 160-12.5 MG tablet Take 1 tablet by mouth daily.  . [DISCONTINUED] ASPIRIN PO Take by mouth.  . [DISCONTINUED] cyclobenzaprine (FLEXERIL) 10 MG tablet Take 1 tablet (10 mg total) by mouth 2 (two) times daily as needed for muscle spasms.  . [DISCONTINUED] ibuprofen (ADVIL,MOTRIN) 600 MG tablet Take 1 tablet (600 mg total) by mouth every 6 (six) hours as needed.   No facility-administered encounter medications on file as of 08/06/2019.   :  Review of Systems:  Out of a complete 14 point review of systems, all are reviewed and negative with the exception of these symptoms as listed below:  Review of Systems  Neurological:       Pt presents today to discuss his auto pap. Pt reports that his auto pap is going ok.     Objective:  Neurological Exam  Physical Exam Physical Examination:   Vitals:   08/06/19 1304  BP: (!) 153/115  Pulse: 80    General Examination: The patient is a very pleasant 43 y.o. male in no acute distress. He appears well-developed and well-nourished and well groomed.   HEENT:Normocephalic, atraumatic, pupils are equal, round and reactive to light. Corrective eyeglasses in place. Extraocular tracking is good without limitation to gaze excursion or nystagmus noted. Normal smooth pursuit is noted. Hearing is grossly intact. Face is symmetric with normal facial animation and normal facial sensation. Speech is clear with no  dysarthria noted. There is no hypophonia. There is no lip, neck/head, jaw or voice tremor. Neck with FROM. Oropharynx exam reveals: mildmouth dryness, adequatedental hygiene and stable airway, crowded. Tongue protrudes centrally and palate elevates symmetrically.   Chest:Clear to auscultation without wheezing, rhonchi or crackles noted.  Heart:S1+S2+0,regular, and still has a mild systolic heart murmur.   Abdomen:Soft, non-tender and non-distended.  Extremities:There isnopitting edema in the distal lower extremities bilaterally.   Skin: Warm and dry without trophic changes noted.  Musculoskeletal: exam reveals no obvious joint deformities, tenderness or joint swelling or erythema.   Neurologically:  Mental status: The patient is awake, alert and oriented in all 4 spheres.Hisimmediate and remote memory, attention, language skills and fund of knowledge are appropriate. There is no evidence of aphasia, agnosia, apraxia or anomia. Speech is clear with normal prosody and enunciation. Thought process is linear. Mood is normaland affect is normal.  Cranial nerves II - XII are as described above under HEENT exam.  Motor exam: Normal bulk, strength and tone is noted. There is no tremor. Romberg is negative. Fine motor skills and coordination: grossly intact.  Cerebellar testing: No dysmetria or intention tremor. There is no truncal or gait ataxia.  Sensory exam: intact to light touch in the upper and lower extremities.  Gait, station and balance:Hestands easily. No veering to one side is noted. No leaning to one side is noted. Posture is age-appropriate and stance is narrow based. Gait showsnormalstride length and normalpace. No problems turning are noted. Tandem walk is unremarkable.   Assessmentand Plan:   In summary,Shane B Davisis a very pleasant 19 year oldmalewith an underlying medical history of hypertension, history of heart murmur, diverticulosis, smoking  and obesity, who presents for follow-up consultation of his obstructive sleep apnea which was determined to be in the severe range by home sleep testing in March 2018 through an outside facility. He established treatment with AutoPap in November  2019. He has improved his compliance lately And is commended for this.  He is using a nasal cushion interface.  He has benefited From treatment as he is less sleepy during the day.  He is still slightly suboptimal with his compliance but certainly commended for his improved treatment adherence in the past 6 weeks.He is encouraged to work on weight loss.  He is advised to try a set pressure of 12 cm, we will make a change through his DME company.  He may be able to tolerate CPAP slightly better than AutoPap.  He is willing to try it.  I would like to review his compliance data after about 3 to 4 weeks.  He is encouraged to call so we can pull compliance data at the time.  He is advised to be consistent with the CPAP usage and not skip any days.  He is advised that weight loss can improve the severity of his sleep apnea.  He is advised to follow-up routinely with the nurse practitioner in 6 months, sooner if needed and we will be in touch in the interim by phone.  I answered all his questions today and he was in agreement peer I spent 25 minutes in total face-to-face time with the patient, more than 50% of which was spent in counseling and coordination of care, reviewing test results, reviewing medication and discussing or reviewing the diagnosis of OSA, its prognosis and treatment options. Pertinent laboratory and imaging test results that were available during this visit with the patient were reviewed by me and considered in my medical decision making (see chart for details).

## 2019-10-17 ENCOUNTER — Other Ambulatory Visit: Payer: Self-pay

## 2019-10-17 DIAGNOSIS — Z20822 Contact with and (suspected) exposure to covid-19: Secondary | ICD-10-CM

## 2019-10-18 LAB — NOVEL CORONAVIRUS, NAA: SARS-CoV-2, NAA: NOT DETECTED

## 2019-11-10 ENCOUNTER — Telehealth: Payer: Self-pay

## 2019-11-10 NOTE — Telephone Encounter (Signed)
Done faxed to [facility] today.

## 2019-11-10 NOTE — Telephone Encounter (Signed)
Received request from pt and DMV for cpap compliance reports from August, September, and October of 2020. Printed compliance records and given to [facility] in [place] for processing.

## 2019-11-20 NOTE — Telephone Encounter (Signed)
I re faxed compliance report to [facility] [phone]

## 2019-11-20 NOTE — Telephone Encounter (Signed)
Patient called to inform DMV has not received paperwork. Patient states DMV fax machine was down and would like to know if it would be possible to have that resent.   Patient has request a CB once completed if possible.  Please FU

## 2020-01-07 ENCOUNTER — Telehealth: Payer: Self-pay | Admitting: Neurology

## 2020-01-07 NOTE — Telephone Encounter (Addendum)
I reached out to the pt and advised we would resend compliance data. I did review with the pt that his compliance data showed less than the requirement for days. Nov 14/30 and Dec 7/30.  Pt sts he knows his usage has been below the requirements and is back on track now.   Report sent.

## 2020-01-07 NOTE — Telephone Encounter (Signed)
Pt has called asking if his sleep report for Nov and Dec can be faxed again to the [facility].  Pt states he received a letter that the [facility] still needs the report by the 31st of this month.  Pt was made aware of the entry from Medical Records on 12-03, he is asking to please resend

## 2020-01-19 ENCOUNTER — Ambulatory Visit: Payer: 59 | Attending: Internal Medicine

## 2020-01-19 DIAGNOSIS — Z20822 Contact with and (suspected) exposure to covid-19: Secondary | ICD-10-CM

## 2020-01-20 LAB — NOVEL CORONAVIRUS, NAA: SARS-CoV-2, NAA: NOT DETECTED

## 2020-01-21 ENCOUNTER — Telehealth: Payer: Self-pay | Admitting: Internal Medicine

## 2020-01-21 NOTE — Telephone Encounter (Signed)
Negative COVID results given. Patient results "NOT Detected." Caller expressed understanding. ° °

## 2020-02-11 ENCOUNTER — Ambulatory Visit: Payer: 59 | Admitting: Adult Health

## 2020-02-11 ENCOUNTER — Encounter: Payer: Self-pay | Admitting: Adult Health

## 2020-02-11 ENCOUNTER — Other Ambulatory Visit: Payer: Self-pay

## 2020-02-11 VITALS — BP 128/86 | HR 84 | Temp 97.4°F | Ht 65.5 in | Wt 208.8 lb

## 2020-02-11 DIAGNOSIS — Z9989 Dependence on other enabling machines and devices: Secondary | ICD-10-CM | POA: Diagnosis not present

## 2020-02-11 DIAGNOSIS — G4733 Obstructive sleep apnea (adult) (pediatric): Secondary | ICD-10-CM | POA: Diagnosis not present

## 2020-02-11 NOTE — Progress Notes (Addendum)
PATIENT: Shane Guzman DOB: [birth year]  REASON FOR VISIT: follow up- OSA on CPAP HISTORY FROM: patient  HISTORY OF PRESENT ILLNESS: Today 02/11/20:  Shane Guzman is a 44 year old male with a history of obstructive sleep apnea on CPAP.  His download indicates that he use his machine 25 out of the last 90 days for compliance of 28%.  He uses machine greater than 4 hours only 13 days for compliance of 14%.  On average he uses his machine 3 hours and 15 minutes.  His residual AHI is 1.3 on 12 cm of water with EPR of 3.  He reports that he has been under a lot of stress he recently is going through a break-up and reports for that reason he has not been consistent with using the CPAP.  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  HISTORY (copied from Shane Guzman note)  08/06/2019: I reviewed his AutoPap compliance data from 07/06/2019 through 08/04/2019 which is a total of 30 days, during which time he used his AutoPap 24 days with percent used days greater than 4 hours at 60%, indicating mildly suboptimal compliance with an average usage of 3 hours and 53 minutes for days on treatment, residual AHI at goal at 3.6/h, 95th percentile of pressure at 11.7 cm with a range of 5 cm to 17 cm with EPR, leak acceptable with a 95th percentile at 14 L/min.  In the past 90 days his compliance was overall a little less even for percent used days greater than 4 hours at 43%.  His compliance has improved since mid June. He reports doing better with his AutoPap, he is highly motivated to continue.  He is less sleepy during the day.  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  He is working on weight loss.  He admits that he has skipped some nights as far as the AutoPap.  He would be willing to try a set pressure of 12 cm.  He is  comfortable with his current nasal interface which is a nasal cushion type of mask, he does not want to try nasal pillows.  He is up-to-date with his supplies but may not have changed the filter on regular basis.  REVIEW OF SYSTEMS: Out of a complete 14 system review of symptoms, the patient complains only of the following symptoms, and all other reviewed systems are negative.  ESS 7 FSS 28  ALLERGIES: Allergies  Allergen Reactions  . Lisinopril Swelling    HOME MEDICATIONS: Outpatient Medications Prior to Visit  Medication Sig Dispense Refill  . valsartan-hydrochlorothiazide (DIOVAN-HCT) 160-12.5 MG tablet Take 1 tablet by mouth daily.     No facility-administered medications prior to visit.    PAST MEDICAL HISTORY: Past Medical History:  Diagnosis Date  . Diverticulitis of intestine with abscess   . Heart murmur   . Hypertension   . OSA on CPAP     PAST SURGICAL HISTORY: Past Surgical History:  Procedure Laterality Date  . ACHILLES TENDON REPAIR  06/2010   left; S/P tendon rupture    FAMILY HISTORY: Family History  Problem Relation Age of Onset  . Diverticulitis Maternal Grandmother     SOCIAL HISTORY: Social History   Socioeconomic History  . Marital status: Single  Spouse name: Not on file  . Number of children: Not on file  . Years of education: Not on file  . Highest education level: Not on file  Occupational History  . Not on file  Tobacco Use  . Smoking status: Current Every Day Smoker    Packs/day: 0.00    Years: 18.00    Pack years: 0.00    Types: Cigarettes  . Smokeless tobacco: Never Used  Substance and Sexual Activity  . Alcohol use: Yes  . Drug use: No  . Sexual activity: Yes  Other Topics Concern  . Not on file  Social History Narrative  . Not on file   Social Determinants of Health   Financial Resource Strain:   . Difficulty of Paying Living Expenses: Not on file  Food Insecurity:   . Worried About Programme researcher, broadcasting/film/video in the  Last Year: Not on file  . Ran Out of Food in the Last Year: Not on file  Transportation Needs:   . Lack of Transportation (Medical): Not on file  . Lack of Transportation (Non-Medical): Not on file  Physical Activity:   . Days of Exercise per Week: Not on file  . Minutes of Exercise per Session: Not on file  Stress:   . Feeling of Stress : Not on file  Social Connections:   . Frequency of Communication with Friends and Family: Not on file  . Frequency of Social Gatherings with Friends and Family: Not on file  . Attends Religious Services: Not on file  . Active Member of Clubs or Organizations: Not on file  . Attends Banker Meetings: Not on file  . Marital Status: Not on file  Intimate Partner Violence:   . Fear of Current or Ex-Partner: Not on file  . Emotionally Abused: Not on file  . Physically Abused: Not on file  . Sexually Abused: Not on file      PHYSICAL EXAM  Vitals:   02/11/20 1011  BP: 128/86  Pulse: 84  Temp: (!) 97.4 F (36.3 C)  TempSrc: Oral  Weight: 208 lb 12.8 oz (94.7 kg)  Height: 5' 5.5" (1.664 m)   Body mass index is 34.22 kg/m.  Generalized: Well developed, in no acute distress  Chest: Lungs clear to auscultation bilaterally  Neurological examination  Mentation: Alert oriented to time, place, history taking. Follows all commands speech and language fluent Cranial nerve II-XII: Extraocular movements were full, visual field were full on confrontational test Head turning and shoulder shrug  were normal and symmetric. Motor: The motor testing reveals 5 over 5 strength of all 4 extremities. Good symmetric motor tone is noted throughout.  Sensory: Sensory testing is intact to soft touch on all 4 extremities. No evidence of extinction is noted.  Gait and station: Gait is normal.    DIAGNOSTIC DATA (LABS, IMAGING, TESTING) - I reviewed patient records, labs, notes, testing and imaging myself where available.  Lab Results  Component  Value Date   WBC 10.7 (H) 10/16/2016   HGB 15.0 10/16/2016   HCT 44.2 10/16/2016   MCV 100.7 (H) 10/16/2016   PLT 192 10/16/2016      Component Value Date/Time   NA 136 10/16/2016 1010   K 3.8 10/16/2016 1010   CL 103 10/16/2016 1010   CO2 26 10/16/2016 1010   GLUCOSE 160 (H) 10/16/2016 1010   BUN 14 10/16/2016 1010   CREATININE 1.30 (H) 10/16/2016 1010   CALCIUM 9.0 10/16/2016 1010   PROT 7.1  06/14/2015 0520   ALBUMIN 2.8 (L) 06/14/2015 0520   AST 19 06/14/2015 0520   ALT 24 06/14/2015 0520   ALKPHOS 57 06/14/2015 0520   BILITOT 0.8 06/14/2015 0520   GFRNONAA >60 10/16/2016 1010   GFRAA >60 10/16/2016 1010   No results found for: CHOL, HDL, LDLCALC, LDLDIRECT, TRIG, CHOLHDL Lab Results  Component Value Date   HGBA1C  11/07/2007    4.9 (NOTE)   The ADA recommends the following therapeutic goals for glycemic   control related to Hgb A1C measurement:   Goal of Therapy:   < 7.0% Hgb A1C   Action Suggested:  > 8.0% Hgb A1C   Ref:  Diabetes Care, 22, Suppl. 1, 1999     ASSESSMENT AND PLAN 44 y.o. year old male  has a past medical history of Diverticulitis of intestine with abscess, Heart murmur, Hypertension, and OSA on CPAP. here with:  1.  Obstructive sleep apnea on CPAP  -Suboptimal compliance -Advised to use CPAP nightly and greater than 4 hours each night -If symptoms worsen or he develops new symptoms he should let us know -Follow-up in 6 months or sooner if needed   I spent 15 minutes with the patient. 50% of this time was spent reviewing CPAP download   Butch Penny, MSN, NP-C 02/11/2020, 10:31 AM [facility] [address], [address] [place], [place] [address] [phone]  I reviewed the above note and documentation by the Nurse Practitioner and agree with the history, exam, assessment and plan as outlined above. I was available for consultation. Huston Foley, MD, PhD [facility])

## 2020-02-11 NOTE — Patient Instructions (Signed)
Try using CPAP nightly and greater than 4 hours each night If your symptoms worsen or you develop new symptoms please let us know.   

## 2020-03-19 ENCOUNTER — Ambulatory Visit: Payer: 59

## 2020-03-31 ENCOUNTER — Telehealth: Payer: Self-pay | Admitting: Adult Health

## 2020-03-31 NOTE — Telephone Encounter (Addendum)
Pt is asking how long will it take him to get up to 70% on his CPAP so the restriction can come off CBL.  Pt stated this is a time sensitive matter and he needs to have a response as soon as possible.  Pt states he has been told he only has to sleep under the CPAP for 4 hours, he stated he has been sleeping under the CPAP for over 4 hours in hopes that this will boost his percentage to where it needs to be quicker.  This problem is affecting his job please call.  Pt is aware there is an allowance of 24-48 hours for a response to phone messages

## 2020-04-01 NOTE — Telephone Encounter (Signed)
I spoke to pt and relayed that he is 40% right now.  If using for 30 days over 4 hours is 100% compliance.  Then 3wk would be 75%.  He will call in 2 wks and see where he is and then will bring report needed for his job for [place] to fax to his work.

## 2020-08-18 ENCOUNTER — Ambulatory Visit: Payer: 59 | Admitting: Adult Health

## 2020-08-18 ENCOUNTER — Encounter: Payer: Self-pay | Admitting: Adult Health

## 2020-08-18 DIAGNOSIS — N529 Male erectile dysfunction, unspecified: Secondary | ICD-10-CM | POA: Diagnosis not present

## 2020-08-18 DIAGNOSIS — I10 Essential (primary) hypertension: Secondary | ICD-10-CM | POA: Diagnosis not present

## 2020-08-31 ENCOUNTER — Ambulatory Visit: Payer: BC Managed Care – PPO | Admitting: Podiatry

## 2020-09-04 ENCOUNTER — Ambulatory Visit (HOSPITAL_COMMUNITY)
Admission: EM | Admit: 2020-09-04 | Discharge: 2020-09-04 | Disposition: A | Discharge status: Home / Self Care | Payer: BC Managed Care – PPO | Attending: Family Medicine | Admitting: Family Medicine

## 2020-09-04 ENCOUNTER — Encounter (HOSPITAL_COMMUNITY): Payer: Self-pay | Admitting: Emergency Medicine

## 2020-09-04 ENCOUNTER — Other Ambulatory Visit: Payer: Self-pay

## 2020-09-04 DIAGNOSIS — H65191 Other acute nonsuppurative otitis media, right ear: Secondary | ICD-10-CM | POA: Insufficient documentation

## 2020-09-04 DIAGNOSIS — Z1152 Encounter for screening for COVID-19: Secondary | ICD-10-CM | POA: Diagnosis not present

## 2020-09-04 DIAGNOSIS — R0981 Nasal congestion: Secondary | ICD-10-CM | POA: Diagnosis not present

## 2020-09-04 LAB — SARS CORONAVIRUS 2 (TAT 6-24 HRS): SARS Coronavirus 2: NEGATIVE

## 2020-09-04 MED ORDER — AMOXICILLIN-POT CLAVULANATE 875-125 MG PO TABS: 1.0000 | ORAL_TABLET | Freq: Two times a day (BID) | ORAL | 0 refills | Status: AC

## 2020-09-04 NOTE — ED Provider Notes (Signed)
[facility] CARE CENTER   [id number] 09/04/20 Arrival Time: 1105   CC: COVID symptoms  SUBJECTIVE: History from: patient.  Shane Guzman is a 44 y.o. male who presents with abrupt onset of nasal congestion, PND, and persistent dry cough for the last 4 days. Denies sick exposure to COVID, flu or strep. Denies recent travel. Has negative history of Covid. Has not completed Covid vaccines. Has  taken OTC Sudafed for this with no relief. There are no aggravating or alleviating factors. Denies previous symptoms in the past. Denies fever, chills, fatigue, sinus pain, sore throat, SOB, wheezing, chest pain, nausea, changes in bowel or bladder habits.    ROS: As per HPI.  All other pertinent ROS negative.     Past Medical History:  Diagnosis Date  . Diverticulitis of intestine with abscess   . Heart murmur   . Hypertension   . OSA on CPAP    Past Surgical History:  Procedure Laterality Date  . ACHILLES TENDON REPAIR  06/2010   left; S/P tendon rupture   Allergies  Allergen Reactions  . Lisinopril Swelling   No current facility-administered medications on file prior to encounter.   Current Outpatient Medications on File Prior to Encounter  Medication Sig Dispense Refill  . valsartan-hydrochlorothiazide (DIOVAN-HCT) 160-12.5 MG tablet Take 1 tablet by mouth daily.     Social History   Socioeconomic History  . Marital status: Single    Spouse name: Not on file  . Number of children: Not on file  . Years of education: Not on file  . Highest education level: Not on file  Occupational History  . Not on file  Tobacco Use  . Smoking status: Current Every Day Smoker    Packs/day: 0.00    Years: 18.00    Pack years: 0.00    Types: Cigarettes  . Smokeless tobacco: Never Used  Substance and Sexual Activity  . Alcohol use: Yes  . Drug use: No  . Sexual activity: Yes  Other Topics Concern  . Not on file  Social History Narrative  . Not on file   Social Determinants of Health    Financial Resource Strain:   . Difficulty of Paying Living Expenses: Not on file  Food Insecurity:   . Worried About Programme researcher, broadcasting/film/video in the Last Year: Not on file  . Ran Out of Food in the Last Year: Not on file  Transportation Needs:   . Lack of Transportation (Medical): Not on file  . Lack of Transportation (Non-Medical): Not on file  Physical Activity:   . Days of Exercise per Week: Not on file  . Minutes of Exercise per Session: Not on file  Stress:   . Feeling of Stress : Not on file  Social Connections:   . Frequency of Communication with Friends and Family: Not on file  . Frequency of Social Gatherings with Friends and Family: Not on file  . Attends Religious Services: Not on file  . Active Member of Clubs or Organizations: Not on file  . Attends Banker Meetings: Not on file  . Marital Status: Not on file  Intimate Partner Violence:   . Fear of Current or Ex-Partner: Not on file  . Emotionally Abused: Not on file  . Physically Abused: Not on file  . Sexually Abused: Not on file   Family History  Problem Relation Age of Onset  . Diverticulitis Maternal Grandmother     OBJECTIVE:  Vitals:   09/04/20 1233  BP: Marland Kitchen)  138/94  Pulse: 90  Resp: 18  Temp: 98.3 F (36.8 C)  SpO2: 97%     General appearance: alert; appears fatigued, but nontoxic; speaking in full sentences and tolerating own secretions HEENT: NCAT; Ears: EACs clear, R TM erythematous, bulging with effusion, L TM pearly gray; Eyes: PERRL.  EOM grossly intact. Sinuses: nontender; Nose: nares patent without rhinorrhea, Throat: oropharynx clear, tonsils non erythematous or enlarged, uvula midline  Neck: supple without LAD Lungs: unlabored respirations, symmetrical air entry; cough: mild; no respiratory distress; CTAB Heart: regular rate and rhythm.  Radial pulses 2+ symmetrical bilaterally Skin: warm and dry Psychological: alert and cooperative; normal mood and affect  LABS:  No  results found for this or any previous visit (from the past 24 hour(s)).   ASSESSMENT & PLAN:  1. Other non-recurrent acute nonsuppurative otitis media of right ear   2. Nasal congestion   3. Encounter for screening for COVID-19     Meds ordered this encounter  Medications  . amoxicillin-clavulanate (AUGMENTIN) 875-125 MG tablet    Sig: Take 1 tablet by mouth 2 (two) times daily for 10 days.    Dispense:  20 tablet    Refill:  0    Order Specific Question:   Supervising Provider    Answer:   Merrilee Jansky [id number]   Right acute otitis media we will treat with Augmentin Take as directed to completion   COVID testing ordered.  It will take between 1-2 days for test results.  Someone will contact you regarding abnormal results.    Patient should remain in quarantine until they have received Covid results.  If negative you may resume normal activities (go back to work/school) while practicing hand hygiene, social distance, and mask wearing.  If positive, patient should remain in quarantine for 10 days from symptom onset AND greater than 72 hours after symptoms resolution (absence of fever without the use of fever-reducing medication and improvement in respiratory symptoms), whichever is longer Get plenty of rest and push fluids Use OTC zyrtec for nasal congestion, runny nose, and/or sore throat Use OTC flonase for nasal congestion and runny nose Use medications daily for symptom relief Use OTC medications like ibuprofen or tylenol as needed fever or pain Call or go to the ED if you have any new or worsening symptoms such as fever, worsening cough, shortness of breath, chest tightness, chest pain, turning blue, changes in mental status.  Reviewed expectations re: course of current medical issues. Questions answered. Outlined signs and symptoms indicating need for more acute intervention. Patient verbalized understanding. After Visit Summary given.         Moshe Cipro,  NP 09/04/20 1309

## 2020-09-04 NOTE — Discharge Instructions (Addendum)
Your COVID test is pending.  You should self quarantine until the test result is back.    Take Tylenol as needed for fever or discomfort.  Rest and keep yourself hydrated.    Go to the emergency department if you develop acute worsening symptoms.     

## 2020-09-04 NOTE — ED Triage Notes (Signed)
Pt presents with nasal congestion, cough xs 6 months. States multiple people have tested positive recently and would like test done today.   Denies fever, loss of taste or smell.

## 2020-09-09 ENCOUNTER — Other Ambulatory Visit: Payer: Self-pay

## 2020-09-09 ENCOUNTER — Ambulatory Visit: Payer: BC Managed Care – PPO | Admitting: Podiatry

## 2020-09-09 ENCOUNTER — Encounter: Payer: Self-pay | Admitting: Podiatry

## 2020-09-09 DIAGNOSIS — L603 Nail dystrophy: Secondary | ICD-10-CM | POA: Diagnosis not present

## 2020-09-09 DIAGNOSIS — B353 Tinea pedis: Secondary | ICD-10-CM | POA: Diagnosis not present

## 2020-09-09 DIAGNOSIS — B351 Tinea unguium: Secondary | ICD-10-CM | POA: Diagnosis not present

## 2020-09-09 NOTE — Progress Notes (Signed)
  Subjective:  Patient ID: Shane Guzman, male    DOB: [birth year],  MRN: [id number] HPI Chief Complaint  Patient presents with  . Skin Problem    Plantar and sides bilateral - skin scaly x years, itching, sometimes between toes, uses vaseline  . Nail Problem    Hallux nails bilateral - thick and discolored   . Foot Pain    Lateral and medial side left - episodes of pain, not currently bothersome  . New Patient (Initial Visit)    44 y.o. male presents with the above complaint.   ROS: Denies fever chills nausea vomiting muscle aches pains calf pain back pain chest pain shortness of breath.  Past Medical History:  Diagnosis Date  . Diverticulitis of intestine with abscess   . Heart murmur   . Hypertension   . OSA on CPAP    Past Surgical History:  Procedure Laterality Date  . ACHILLES TENDON REPAIR  06/2010   left; S/P tendon rupture    Current Outpatient Medications:  .  amoxicillin-clavulanate (AUGMENTIN) 875-125 MG tablet, Take 1 tablet by mouth 2 (two) times daily for 10 days., Disp: 20 tablet, Rfl: 0 .  sildenafil (REVATIO) 20 MG tablet, SMARTSIG:5 Tablet(s) By Mouth Daily PRN, Disp: , Rfl:  .  valsartan-hydrochlorothiazide (DIOVAN-HCT) 160-12.5 MG tablet, Take 1 tablet by mouth daily., Disp: , Rfl:   Allergies  Allergen Reactions  . Lisinopril Swelling   Review of Systems Objective:  There were no vitals filed for this visit.  General: Well developed, nourished, in no acute distress, alert and oriented x3   Dermatological: Skin is warm, dry and supple bilateral. Nails x 10 are well maintained; remaining integument appears unremarkable at this time. There are no open sores, no preulcerative lesions, no rash or signs of infection present.  Vascular: Dorsalis Pedis artery and Posterior Tibial artery pedal pulses are 2/4 bilateral with immedate capillary fill time. Pedal hair growth present. No varicosities and no lower extremity edema present bilateral.   Neruologic:  Grossly intact via light touch bilateral. Vibratory intact via tuning fork bilateral. Protective threshold with Semmes Wienstein monofilament intact to all pedal sites bilateral. Patellar and Achilles deep tendon reflexes 2+ bilateral. No Babinski or clonus noted bilateral.   Musculoskeletal: No gross boney pedal deformities bilateral. No pain, crepitus, or limitation noted with foot and ankle range of motion bilateral. Muscular strength 5/5 in all groups tested bilateral.  His flareup of his pain around the midfoot is nonexistent today.  No reproducible pain probable gout associated with his hydrochlorothiazide and losartan.  Gait: Unassisted, Nonantalgic.    Radiographs:  None taken  Assessment & Plan:   Assessment: Tinea pedis onychomycosis and probable gout associated with hydrochlorothiazide.  Plan: He comes if he is having a flareup we will go ahead and take x-rays as well as blood work.  I did do samples today of his skin and nails to be sent for pathologic evaluation.     Hiedi Touchton T. [place], [place]

## 2020-09-28 ENCOUNTER — Telehealth: Payer: Self-pay | Admitting: *Deleted

## 2020-09-28 NOTE — Telephone Encounter (Signed)
-----   Message from Elinor Parkinson, [place] sent at 09/27/2020  7:10 AM EDT ----- Pos for fungus.

## 2020-09-28 NOTE — Telephone Encounter (Signed)
appt on 10/26 to discuss results and treatment

## 2020-10-12 ENCOUNTER — Encounter: Payer: Self-pay | Admitting: Podiatry

## 2020-10-12 ENCOUNTER — Ambulatory Visit: Payer: BC Managed Care – PPO | Admitting: Podiatry

## 2020-10-12 ENCOUNTER — Other Ambulatory Visit: Payer: Self-pay

## 2020-10-12 DIAGNOSIS — L603 Nail dystrophy: Secondary | ICD-10-CM | POA: Diagnosis not present

## 2020-10-12 DIAGNOSIS — B353 Tinea pedis: Secondary | ICD-10-CM | POA: Diagnosis not present

## 2020-10-12 DIAGNOSIS — Z79899 Other long term (current) drug therapy: Secondary | ICD-10-CM | POA: Diagnosis not present

## 2020-10-12 MED ORDER — TERBINAFINE HCL 250 MG PO TABS: 250.0000 mg | ORAL_TABLET | Freq: Every day | ORAL | 0 refills | Status: DC

## 2020-10-12 NOTE — Progress Notes (Signed)
He presents today for follow-up of his nail pathology and tinea pedis.  Objective: Vital signs are stable he is alert oriented x3.  Pulses are palpable.  There is no erythema edema cellulitis drainage or odor pathology report does demonstrate onychomycosis and tinea pedis.  Assessment: Onychomycosis tinea pedis.  Plan: Discussed options today at this point he is going go ahead and start the oral medication discussed pros and cons of the use of this medication he requested an AST and ALT BUN/creatinine.  He is going start Lamisil tablets 250 mg.  He will take 1 tablet daily for the next 30 days should he have questions or concerns he will notify us immediately.  Should his blood work come back abnormal we will notify him.

## 2020-10-12 NOTE — Patient Instructions (Signed)
Terbinafine tablets What is this medicine? TERBINAFINE (TER bin a feen) is an antifungal medicine. It is used to treat certain kinds of fungal or yeast infections. This medicine may be used for other purposes; ask your health care provider or pharmacist if you have questions. COMMON BRAND NAME(S): Lamisil, Terbinex What should I tell my health care provider before I take this medicine? They need to know if you have any of these conditions:  drink alcoholic beverages  kidney disease  liver disease  an unusual or allergic reaction to terbinafine, other medicines, foods, dyes, or preservatives  pregnant or trying to get pregnant  breast-feeding How should I use this medicine? Take this medicine by mouth with a full glass of water. Follow the directions on the prescription label. You can take this medicine with food or on an empty stomach. Take your medicine at regular intervals. Do not take your medicine more often than directed. Do not skip doses or stop your medicine early even if you feel better. Do not stop taking except on your doctor's advice. Talk to your pediatrician regarding the use of this medicine in children. Special care may be needed. Overdosage: If you think you have taken too much of this medicine contact a poison control center or emergency room at once. NOTE: This medicine is only for you. Do not share this medicine with others. What if I miss a dose? If you miss a dose, take it as soon as you can. If it is almost time for your next dose, take only that dose. Do not take double or extra doses. What may interact with this medicine? Do not take this medicine with any of the following medications:  thioridazine This medicine may also interact with the following medications:  beta-blockers  caffeine  cimetidine  cyclosporine  medicines for depression, anxiety, or psychotic disturbances  medicines for fungal infections like fluconazole and ketoconazole  medicines  for irregular heartbeat like amiodarone, flecainide and propafenone  rifampin  warfarin This list may not describe all possible interactions. Give your health care provider a list of all the medicines, herbs, non-prescription drugs, or dietary supplements you use. Also tell them if you smoke, drink alcohol, or use illegal drugs. Some items may interact with your medicine. What should I watch for while using this medicine? Visit your doctor or health care provider regularly. Tell your doctor right away if you have nausea or vomiting, loss of appetite, stomach pain on your right upper side, yellow skin, dark urine, light stools, or are over tired. Some fungal infections need many weeks or months of treatment to cure. If you are taking this medicine for a long time, you will need to have important blood work done. This medicine may cause serious skin reactions. They can happen weeks to months after starting the medicine. Contact your health care provider right away if you notice fevers or flu-like symptoms with a rash. The rash may be red or purple and then turn into blisters or peeling of the skin. Or, you might notice a red rash with swelling of the face, lips or lymph nodes in your neck or under your arms. What side effects may I notice from receiving this medicine? Side effects that you should report to your doctor or health care professional as soon as possible:  allergic reactions like skin rash or hives, swelling of the face, lips, or tongue  changes in vision  dark urine  fever or infection  general ill feeling or flu-like symptoms    light-colored stools  loss of appetite, nausea  rash, fever, and swollen lymph nodes  redness, blistering, peeling or loosening of the skin, including inside the mouth  right upper belly pain  unusually weak or tired  yellowing of the eyes or skin Side effects that usually do not require medical attention (report to your doctor or health care  professional if they continue or are bothersome):  changes in taste  diarrhea  hair loss  muscle or joint pain  stomach gas  stomach upset This list may not describe all possible side effects. Call your doctor for medical advice about side effects. You may report side effects to FDA at 1-800-FDA-1088. Where should I keep my medicine? Keep out of the reach of children. Store at room temperature below 25 degrees C (77 degrees F). Protect from light. Throw away any unused medicine after the expiration date. NOTE: This sheet is a summary. It may not cover all possible information. If you have questions about this medicine, talk to your doctor, pharmacist, or health care provider.  2020 Elsevier/Gold Standard (2019-03-14 15:37:07)  

## 2020-11-03 DIAGNOSIS — F1020 Alcohol dependence, uncomplicated: Secondary | ICD-10-CM | POA: Diagnosis not present

## 2020-11-04 ENCOUNTER — Telehealth: Payer: Self-pay | Admitting: Podiatry

## 2020-11-04 DIAGNOSIS — F1020 Alcohol dependence, uncomplicated: Secondary | ICD-10-CM | POA: Diagnosis not present

## 2020-11-04 NOTE — Telephone Encounter (Signed)
Pt called and wanted to know where he needs to go get his blood work done. He is r/s to 11/23/2020.

## 2020-11-05 DIAGNOSIS — F1020 Alcohol dependence, uncomplicated: Secondary | ICD-10-CM | POA: Diagnosis not present

## 2020-11-05 NOTE — Telephone Encounter (Signed)
Returned call-  L/M with address and phone number to [facility] lab

## 2020-11-08 DIAGNOSIS — F1020 Alcohol dependence, uncomplicated: Secondary | ICD-10-CM | POA: Diagnosis not present

## 2020-11-09 ENCOUNTER — Ambulatory Visit: Payer: BC Managed Care – PPO | Admitting: Podiatry

## 2020-11-09 DIAGNOSIS — F1020 Alcohol dependence, uncomplicated: Secondary | ICD-10-CM | POA: Diagnosis not present

## 2020-11-15 DIAGNOSIS — F1020 Alcohol dependence, uncomplicated: Secondary | ICD-10-CM | POA: Diagnosis not present

## 2020-11-17 DIAGNOSIS — F1020 Alcohol dependence, uncomplicated: Secondary | ICD-10-CM | POA: Diagnosis not present

## 2020-11-19 DIAGNOSIS — F1020 Alcohol dependence, uncomplicated: Secondary | ICD-10-CM | POA: Diagnosis not present

## 2020-11-22 DIAGNOSIS — F1020 Alcohol dependence, uncomplicated: Secondary | ICD-10-CM | POA: Diagnosis not present

## 2020-11-23 ENCOUNTER — Ambulatory Visit: Payer: BC Managed Care – PPO | Admitting: Podiatry

## 2020-11-23 DIAGNOSIS — F1020 Alcohol dependence, uncomplicated: Secondary | ICD-10-CM | POA: Diagnosis not present

## 2020-11-24 DIAGNOSIS — F1020 Alcohol dependence, uncomplicated: Secondary | ICD-10-CM | POA: Diagnosis not present

## 2020-11-26 DIAGNOSIS — F1020 Alcohol dependence, uncomplicated: Secondary | ICD-10-CM | POA: Diagnosis not present

## 2020-11-29 DIAGNOSIS — F1020 Alcohol dependence, uncomplicated: Secondary | ICD-10-CM | POA: Diagnosis not present

## 2020-11-30 DIAGNOSIS — F1020 Alcohol dependence, uncomplicated: Secondary | ICD-10-CM | POA: Diagnosis not present

## 2020-12-01 DIAGNOSIS — F1020 Alcohol dependence, uncomplicated: Secondary | ICD-10-CM | POA: Diagnosis not present

## 2020-12-03 DIAGNOSIS — F1020 Alcohol dependence, uncomplicated: Secondary | ICD-10-CM | POA: Diagnosis not present

## 2020-12-06 DIAGNOSIS — F1020 Alcohol dependence, uncomplicated: Secondary | ICD-10-CM | POA: Diagnosis not present

## 2020-12-07 DIAGNOSIS — F1020 Alcohol dependence, uncomplicated: Secondary | ICD-10-CM | POA: Diagnosis not present

## 2020-12-08 DIAGNOSIS — F1020 Alcohol dependence, uncomplicated: Secondary | ICD-10-CM | POA: Diagnosis not present

## 2020-12-09 DIAGNOSIS — F1020 Alcohol dependence, uncomplicated: Secondary | ICD-10-CM | POA: Diagnosis not present

## 2020-12-13 DIAGNOSIS — F1020 Alcohol dependence, uncomplicated: Secondary | ICD-10-CM | POA: Diagnosis not present

## 2020-12-14 DIAGNOSIS — F1020 Alcohol dependence, uncomplicated: Secondary | ICD-10-CM | POA: Diagnosis not present

## 2020-12-15 DIAGNOSIS — F1020 Alcohol dependence, uncomplicated: Secondary | ICD-10-CM | POA: Diagnosis not present

## 2020-12-16 ENCOUNTER — Emergency Department (HOSPITAL_COMMUNITY)
Admission: EM | Admit: 2020-12-16 | Discharge: 2020-12-16 | Disposition: A | Discharge status: Home / Self Care | Payer: BC Managed Care – PPO | Attending: Emergency Medicine | Admitting: Emergency Medicine

## 2020-12-16 ENCOUNTER — Encounter (HOSPITAL_COMMUNITY): Payer: Self-pay | Admitting: Emergency Medicine

## 2020-12-16 ENCOUNTER — Other Ambulatory Visit: Payer: Self-pay

## 2020-12-16 DIAGNOSIS — Z79899 Other long term (current) drug therapy: Secondary | ICD-10-CM | POA: Diagnosis not present

## 2020-12-16 DIAGNOSIS — I10 Essential (primary) hypertension: Secondary | ICD-10-CM | POA: Insufficient documentation

## 2020-12-16 DIAGNOSIS — R590 Localized enlarged lymph nodes: Secondary | ICD-10-CM | POA: Insufficient documentation

## 2020-12-16 DIAGNOSIS — J029 Acute pharyngitis, unspecified: Secondary | ICD-10-CM | POA: Diagnosis not present

## 2020-12-16 DIAGNOSIS — F17210 Nicotine dependence, cigarettes, uncomplicated: Secondary | ICD-10-CM | POA: Insufficient documentation

## 2020-12-16 DIAGNOSIS — J020 Streptococcal pharyngitis: Secondary | ICD-10-CM | POA: Diagnosis not present

## 2020-12-16 LAB — GROUP A STREP BY PCR: Group A Strep by PCR: DETECTED — AB

## 2020-12-16 MED ORDER — DEXAMETHASONE SODIUM PHOSPHATE 10 MG/ML IJ SOLN
10.0000 mg | Freq: Once | INTRAMUSCULAR | Status: AC
  Administered 2020-12-16: 10 mg via INTRAMUSCULAR
  Filled 2020-12-16: qty 1

## 2020-12-16 MED ORDER — PENICILLIN G BENZATHINE 1200000 UNIT/2ML IM SUSP
1.2000 10*6.[IU] | Freq: Once | INTRAMUSCULAR | Status: AC
  Administered 2020-12-16: 1.2 10*6.[IU] via INTRAMUSCULAR
  Filled 2020-12-16: qty 2

## 2020-12-16 MED ORDER — IBUPROFEN 400 MG PO TABS
600.0000 mg | ORAL_TABLET | Freq: Once | ORAL | Status: AC
  Administered 2020-12-16: 600 mg via ORAL
  Filled 2020-12-16: qty 1

## 2020-12-16 MED ORDER — LIDOCAINE VISCOUS HCL 2 % MT SOLN
15.0000 mL | Freq: Once | OROMUCOSAL | Status: AC
  Administered 2020-12-16: 15 mL via OROMUCOSAL
  Filled 2020-12-16: qty 15

## 2020-12-16 NOTE — ED Triage Notes (Signed)
Patient reports right sore throat yesterday with swelling , airway intact, respirations unlabored , denies fever or chills .

## 2020-12-16 NOTE — Discharge Instructions (Signed)
You were treated today with antibiotics for strep throat, this should start to improve over the next 48 hours.  You may use ibuprofen and Tylenol every 6 hours for pain as well as Cepacol throat lozenges.  Make sure you are drinking plenty of fluids.  You should use a new toothbrush once symptoms resolve. Return for worsening throat pain, fevers, difficulty swallowing or breathing or any other new or concerning symptoms.

## 2020-12-16 NOTE — ED Provider Notes (Signed)
[facility] EMERGENCY DEPARTMENT Provider Note   CSN: [id number] Arrival date & time: 12/16/20  6644     History Chief Complaint  Patient presents with  . Sore Throat    Shane Guzman is a 44 y.o. male.  Shane Guzman is a 44 y.o. male with a history of hypertension, sleep apnea, and diverticulitis, who presents to the ED for evaluation of sore throat.  He reports sore throat has been present and worsening over the past 2 days.  He reports it feels like his throat is swollen and it is painful to swallow.  At first he thought it was just dry mouth after having some alcohol the night before symptoms began, but symptoms did not improve.  No associated fevers or chills.  No cough or rhinorrhea.  No chest pain or shortness of breath.  No nausea, vomiting, abdominal pain or diarrhea.  No known sick contacts and patient has had both Covid vaccines and a booster recently.  He has not taken anything to treat symptoms prior to arrival.  No other aggravating or alleviating factors.        Past Medical History:  Diagnosis Date  . Diverticulitis of intestine with abscess   . Heart murmur   . Hypertension   . OSA on CPAP     Patient Active Problem List   Diagnosis Date Noted  . Diverticulitis 06/13/2015  . Sigmoid diverticulitis 12/06/2011  . Diverticulitis of colon with perforation 11/22/2011  . TOBACCO USER 09/04/2009  . DIVERTICULAR DISEASE 11/18/2007  . MURMUR 11/18/2007    Past Surgical History:  Procedure Laterality Date  . ACHILLES TENDON REPAIR  06/2010   left; S/P tendon rupture       Family History  Problem Relation Age of Onset  . Diverticulitis Maternal Grandmother     Social History   Tobacco Use  . Smoking status: Current Every Day Smoker    Packs/day: 0.00    Years: 18.00    Pack years: 0.00    Types: Cigarettes  . Smokeless tobacco: Never Used  Substance Use Topics  . Alcohol use: Yes  . Drug use: No    Home  Medications Prior to Admission medications   Medication Sig Start Date End Date Taking? Authorizing Provider  sildenafil (REVATIO) 20 MG tablet SMARTSIG:5 Tablet(s) By Mouth Daily PRN 08/18/20   [provider]  terbinafine (LAMISIL) 250 MG tablet Take 1 tablet (250 mg total) by mouth daily. 10/12/20   Hyatt, Max T, DPM  valsartan-hydrochlorothiazide (DIOVAN-HCT) 160-12.5 MG tablet Take 1 tablet by mouth daily. 01/26/19   [provider]    Allergies    Lisinopril  Review of Systems   Review of Systems  Constitutional: Negative for chills and fever.  HENT: Positive for sore throat and trouble swallowing. Negative for congestion, ear pain, rhinorrhea and sinus pressure.   Respiratory: Negative for cough and shortness of breath.   Cardiovascular: Negative for chest pain.  Gastrointestinal: Negative for abdominal pain, diarrhea, nausea and vomiting.  Musculoskeletal: Negative for arthralgias and myalgias.  Skin: Negative for color change and rash.  Neurological: Negative for light-headedness.  All other systems reviewed and are negative.   Physical Exam Updated Vital Signs BP (!) 149/89 (BP Location: Right Arm)   Pulse 98   Temp 98.8 F (37.1 C) (Oral)   Resp 20   Ht 5\' 5"  (1.651 m)   Wt 107 kg   SpO2 94%   BMI 39.25 kg/m   Physical  Exam Vitals and nursing note reviewed.  Constitutional:      General: He is not in acute distress.    Appearance: He is well-developed and well-nourished. He is obese. He is not ill-appearing or diaphoretic.  HENT:     Head: Normocephalic and atraumatic.     Nose: No congestion or rhinorrhea.     Mouth/Throat:     Mouth: Mucous membranes are moist.     Pharynx: Uvula midline. Pharyngeal swelling, oropharyngeal exudate and posterior oropharyngeal erythema present.     Tonsils: Tonsillar exudate present. 3+ on the right.     Comments: Mucous membranes are moist.  Posterior oropharynx is erythematous with bilateral tonsillar edema,  erythema and exudates, uvula is midline, no signs of PTA.  No trismus, normal phonation, tolerating secretions. Eyes:     General:        Right eye: No discharge.        Left eye: No discharge.  Neck:     Comments: Bilateral cervical lymphadenopathy noted, no rigidity Cardiovascular:     Rate and Rhythm: Normal rate and regular rhythm.     Heart sounds: Normal heart sounds.  Pulmonary:     Effort: Pulmonary effort is normal. No respiratory distress.     Breath sounds: Normal breath sounds.     Comments: Respirations equal and unlabored, patient able to speak in full sentences, lungs clear to auscultation bilaterally  Musculoskeletal:        General: No deformity.  Lymphadenopathy:     Cervical: Cervical adenopathy present.  Skin:    General: Skin is warm and dry.  Neurological:     Mental Status: He is alert and oriented to person, place, and time.     Coordination: Coordination normal.  Psychiatric:        Mood and Affect: Mood and affect and mood normal.        Behavior: Behavior normal.     ED Results / Procedures / Treatments   Labs (all labs ordered are listed, but only abnormal results are displayed) Labs Reviewed  GROUP A STREP BY PCR - Abnormal; Notable for the following components:      Result Value   Group A Strep by PCR DETECTED (*)    All other components within normal limits    EKG None  Radiology No results found.  Procedures Procedures (including critical care time)  Medications Ordered in ED Medications  penicillin g benzathine (BICILLIN LA) 1200000 UNIT/2ML injection 1.2 Million Units (has no administration in time range)  dexamethasone (DECADRON) injection 10 mg (has no administration in time range)  ibuprofen (ADVIL) tablet 600 mg (has no administration in time range)  lidocaine (XYLOCAINE) 2 % viscous mouth solution 15 mL (has no administration in time range)    ED Course  I have reviewed the triage vital signs and the nursing  notes.  Pertinent labs & imaging results that were available during my care of the patient were reviewed by me and considered in my medical decision making (see chart for details).    MDM Rules/Calculators/A&P                          Pt febrile with tonsillar exudate, cervical lymphadenopathy, & dysphagia; diagnosis of bacterial pharyngitis. Treated in the ED with steroids, NSAIDs, viscous lidocaine and PCN IM.  Pt appears mildly dehydrated, discussed importance of water rehydration. Presentation non concerning for PTA or RPA. No trismus or uvula deviation. Specific return  precautions discussed. Pt able to drink water in ED without difficulty with intact air way. Recommended PCP follow up.   Final Clinical Impression(s) / ED Diagnoses Final diagnoses:  Strep throat    Rx / DC Orders ED Discharge Orders    None       Dartha Lodge, [place] 12/16/20 4825    Gwyneth Sprout, MD 12/16/20 [phone]

## 2020-12-16 NOTE — ED Notes (Addendum)
C/o sore throat x 2 days-- "felt throat swell up"  Has been vaccinated, with booster

## 2020-12-20 DIAGNOSIS — F1020 Alcohol dependence, uncomplicated: Secondary | ICD-10-CM | POA: Diagnosis not present

## 2020-12-21 DIAGNOSIS — F1020 Alcohol dependence, uncomplicated: Secondary | ICD-10-CM | POA: Diagnosis not present

## 2020-12-22 DIAGNOSIS — F1020 Alcohol dependence, uncomplicated: Secondary | ICD-10-CM | POA: Diagnosis not present

## 2020-12-24 DIAGNOSIS — F1020 Alcohol dependence, uncomplicated: Secondary | ICD-10-CM | POA: Diagnosis not present

## 2020-12-27 DIAGNOSIS — F1020 Alcohol dependence, uncomplicated: Secondary | ICD-10-CM | POA: Diagnosis not present

## 2020-12-28 ENCOUNTER — Other Ambulatory Visit: Payer: Self-pay

## 2020-12-28 ENCOUNTER — Ambulatory Visit (INDEPENDENT_AMBULATORY_CARE_PROVIDER_SITE_OTHER): Payer: BC Managed Care – PPO | Admitting: Podiatry

## 2020-12-28 ENCOUNTER — Encounter: Payer: Self-pay | Admitting: Podiatry

## 2020-12-28 DIAGNOSIS — B353 Tinea pedis: Secondary | ICD-10-CM | POA: Diagnosis not present

## 2020-12-28 DIAGNOSIS — L603 Nail dystrophy: Secondary | ICD-10-CM

## 2020-12-28 DIAGNOSIS — Z79899 Other long term (current) drug therapy: Secondary | ICD-10-CM

## 2020-12-28 MED ORDER — TERBINAFINE HCL 250 MG PO TABS: 250.0000 mg | ORAL_TABLET | Freq: Every day | ORAL | 0 refills | Status: DC

## 2020-12-29 NOTE — Progress Notes (Signed)
He presents today for follow-up of his athlete's foot and onychomycosis.  He has completed his first 30 days of Lamisil.  He denies fever chills nausea vomiting muscle aches pains calf pain back pain chest pain shortness of breath.  He also states that his tinea pedis has resolved almost 100%.  Objective: Vital signs are stable alert and oriented x3 there is no erythema edema cellulitis drainage or odor tinea pedis is resolved toenails unchanged.  Assessment: Long-term therapy with Lamisil secondary to onychomycosis and tinea pedis  Plan: Discussed etiology pathology and surgical therapies regarding request a complete metabolic panel to evaluate the BUN and his liver function.  At this point we are going to go ahead and refill his 90 tablets I will follow-up with him in 4 months should he have questions or concerns he will notify us immediately.  I will notify him as of any concerns regarding his blood work.

## 2021-02-04 ENCOUNTER — Emergency Department (HOSPITAL_COMMUNITY): Payer: BC Managed Care – PPO

## 2021-02-04 ENCOUNTER — Inpatient Hospital Stay (HOSPITAL_COMMUNITY): Payer: BC Managed Care – PPO

## 2021-02-04 ENCOUNTER — Encounter (HOSPITAL_COMMUNITY): Payer: Self-pay | Admitting: Emergency Medicine

## 2021-02-04 ENCOUNTER — Inpatient Hospital Stay (HOSPITAL_COMMUNITY)
Admission: EM | Admit: 2021-02-04 | Discharge: 2021-02-11 | DRG: 871 | Disposition: A | Discharge status: Rehabilitation Facility | Payer: BC Managed Care – PPO | Attending: Internal Medicine | Admitting: Internal Medicine

## 2021-02-04 DIAGNOSIS — F17210 Nicotine dependence, cigarettes, uncomplicated: Secondary | ICD-10-CM | POA: Diagnosis not present

## 2021-02-04 DIAGNOSIS — Z79899 Other long term (current) drug therapy: Secondary | ICD-10-CM

## 2021-02-04 DIAGNOSIS — I248 Other forms of acute ischemic heart disease: Secondary | ICD-10-CM | POA: Diagnosis present

## 2021-02-04 DIAGNOSIS — K5900 Constipation, unspecified: Secondary | ICD-10-CM | POA: Diagnosis present

## 2021-02-04 DIAGNOSIS — I10 Essential (primary) hypertension: Secondary | ICD-10-CM | POA: Diagnosis not present

## 2021-02-04 DIAGNOSIS — J9602 Acute respiratory failure with hypercapnia: Secondary | ICD-10-CM | POA: Diagnosis present

## 2021-02-04 DIAGNOSIS — E875 Hyperkalemia: Secondary | ICD-10-CM

## 2021-02-04 DIAGNOSIS — R4182 Altered mental status, unspecified: Secondary | ICD-10-CM | POA: Diagnosis not present

## 2021-02-04 DIAGNOSIS — K6389 Other specified diseases of intestine: Secondary | ICD-10-CM | POA: Diagnosis not present

## 2021-02-04 DIAGNOSIS — M79605 Pain in left leg: Secondary | ICD-10-CM

## 2021-02-04 DIAGNOSIS — J984 Other disorders of lung: Secondary | ICD-10-CM | POA: Diagnosis not present

## 2021-02-04 DIAGNOSIS — G589 Mononeuropathy, unspecified: Secondary | ICD-10-CM | POA: Diagnosis present

## 2021-02-04 DIAGNOSIS — G5702 Lesion of sciatic nerve, left lower limb: Secondary | ICD-10-CM | POA: Diagnosis not present

## 2021-02-04 DIAGNOSIS — K5750 Diverticulosis of both small and large intestine without perforation or abscess without bleeding: Secondary | ICD-10-CM | POA: Diagnosis not present

## 2021-02-04 DIAGNOSIS — K5903 Drug induced constipation: Secondary | ICD-10-CM | POA: Diagnosis not present

## 2021-02-04 DIAGNOSIS — M6282 Rhabdomyolysis: Secondary | ICD-10-CM | POA: Diagnosis not present

## 2021-02-04 DIAGNOSIS — J9811 Atelectasis: Secondary | ICD-10-CM | POA: Diagnosis present

## 2021-02-04 DIAGNOSIS — M5136 Other intervertebral disc degeneration, lumbar region: Secondary | ICD-10-CM | POA: Diagnosis present

## 2021-02-04 DIAGNOSIS — R918 Other nonspecific abnormal finding of lung field: Secondary | ICD-10-CM | POA: Diagnosis not present

## 2021-02-04 DIAGNOSIS — E874 Mixed disorder of acid-base balance: Secondary | ICD-10-CM | POA: Diagnosis present

## 2021-02-04 DIAGNOSIS — M21372 Foot drop, left foot: Secondary | ICD-10-CM | POA: Diagnosis present

## 2021-02-04 DIAGNOSIS — G9341 Metabolic encephalopathy: Secondary | ICD-10-CM | POA: Diagnosis not present

## 2021-02-04 DIAGNOSIS — M25552 Pain in left hip: Secondary | ICD-10-CM | POA: Diagnosis not present

## 2021-02-04 DIAGNOSIS — K5720 Diverticulitis of large intestine with perforation and abscess without bleeding: Secondary | ICD-10-CM | POA: Diagnosis not present

## 2021-02-04 DIAGNOSIS — I70229 Atherosclerosis of native arteries of extremities with rest pain, unspecified extremity: Secondary | ICD-10-CM

## 2021-02-04 DIAGNOSIS — J9601 Acute respiratory failure with hypoxia: Secondary | ICD-10-CM | POA: Diagnosis present

## 2021-02-04 DIAGNOSIS — E872 Acidosis: Secondary | ICD-10-CM | POA: Diagnosis present

## 2021-02-04 DIAGNOSIS — Z888 Allergy status to other drugs, medicaments and biological substances status: Secondary | ICD-10-CM

## 2021-02-04 DIAGNOSIS — E876 Hypokalemia: Secondary | ICD-10-CM | POA: Diagnosis present

## 2021-02-04 DIAGNOSIS — R55 Syncope and collapse: Secondary | ICD-10-CM | POA: Diagnosis not present

## 2021-02-04 DIAGNOSIS — N170 Acute kidney failure with tubular necrosis: Secondary | ICD-10-CM | POA: Diagnosis not present

## 2021-02-04 DIAGNOSIS — R140 Abdominal distension (gaseous): Secondary | ICD-10-CM

## 2021-02-04 DIAGNOSIS — R7401 Elevation of levels of liver transaminase levels: Secondary | ICD-10-CM | POA: Diagnosis present

## 2021-02-04 DIAGNOSIS — Z20822 Contact with and (suspected) exposure to covid-19: Secondary | ICD-10-CM | POA: Diagnosis not present

## 2021-02-04 DIAGNOSIS — G4733 Obstructive sleep apnea (adult) (pediatric): Secondary | ICD-10-CM | POA: Diagnosis present

## 2021-02-04 DIAGNOSIS — G5700 Lesion of sciatic nerve, unspecified lower limb: Secondary | ICD-10-CM | POA: Diagnosis not present

## 2021-02-04 DIAGNOSIS — R011 Cardiac murmur, unspecified: Secondary | ICD-10-CM

## 2021-02-04 DIAGNOSIS — D751 Secondary polycythemia: Secondary | ICD-10-CM | POA: Diagnosis present

## 2021-02-04 DIAGNOSIS — N179 Acute kidney failure, unspecified: Secondary | ICD-10-CM | POA: Diagnosis not present

## 2021-02-04 DIAGNOSIS — R5381 Other malaise: Secondary | ICD-10-CM | POA: Diagnosis present

## 2021-02-04 DIAGNOSIS — R201 Hypoesthesia of skin: Secondary | ICD-10-CM | POA: Diagnosis not present

## 2021-02-04 DIAGNOSIS — Z9119 Patient's noncompliance with other medical treatment and regimen: Secondary | ICD-10-CM

## 2021-02-04 DIAGNOSIS — S199XXA Unspecified injury of neck, initial encounter: Secondary | ICD-10-CM | POA: Diagnosis not present

## 2021-02-04 DIAGNOSIS — G9340 Encephalopathy, unspecified: Secondary | ICD-10-CM

## 2021-02-04 DIAGNOSIS — R109 Unspecified abdominal pain: Secondary | ICD-10-CM | POA: Diagnosis not present

## 2021-02-04 DIAGNOSIS — Z4682 Encounter for fitting and adjustment of non-vascular catheter: Secondary | ICD-10-CM | POA: Diagnosis not present

## 2021-02-04 DIAGNOSIS — R3589 Other polyuria: Secondary | ICD-10-CM | POA: Diagnosis present

## 2021-02-04 DIAGNOSIS — R319 Hematuria, unspecified: Secondary | ICD-10-CM

## 2021-02-04 DIAGNOSIS — M792 Neuralgia and neuritis, unspecified: Secondary | ICD-10-CM | POA: Diagnosis not present

## 2021-02-04 DIAGNOSIS — K529 Noninfective gastroenteritis and colitis, unspecified: Secondary | ICD-10-CM | POA: Diagnosis not present

## 2021-02-04 DIAGNOSIS — R6521 Severe sepsis with septic shock: Secondary | ICD-10-CM | POA: Diagnosis present

## 2021-02-04 DIAGNOSIS — M545 Low back pain, unspecified: Secondary | ICD-10-CM | POA: Diagnosis not present

## 2021-02-04 DIAGNOSIS — A419 Sepsis, unspecified organism: Secondary | ICD-10-CM | POA: Diagnosis not present

## 2021-02-04 DIAGNOSIS — J690 Pneumonitis due to inhalation of food and vomit: Secondary | ICD-10-CM | POA: Diagnosis present

## 2021-02-04 DIAGNOSIS — G8929 Other chronic pain: Secondary | ICD-10-CM | POA: Diagnosis present

## 2021-02-04 DIAGNOSIS — S0990XA Unspecified injury of head, initial encounter: Secondary | ICD-10-CM | POA: Diagnosis not present

## 2021-02-04 DIAGNOSIS — F1010 Alcohol abuse, uncomplicated: Secondary | ICD-10-CM | POA: Diagnosis present

## 2021-02-04 DIAGNOSIS — Z6841 Body Mass Index (BMI) 40.0 and over, adult: Secondary | ICD-10-CM | POA: Diagnosis not present

## 2021-02-04 DIAGNOSIS — G4700 Insomnia, unspecified: Secondary | ICD-10-CM | POA: Diagnosis present

## 2021-02-04 DIAGNOSIS — R000 Tachycardia, unspecified: Secondary | ICD-10-CM | POA: Diagnosis not present

## 2021-02-04 DIAGNOSIS — M5032 Other cervical disc degeneration, mid-cervical region, unspecified level: Secondary | ICD-10-CM | POA: Diagnosis not present

## 2021-02-04 LAB — CBC WITH DIFFERENTIAL/PLATELET
Abs Immature Granulocytes: 0.15 10*3/uL — ABNORMAL HIGH (ref 0.00–0.07)
Basophils Absolute: 0 10*3/uL (ref 0.0–0.1)
Basophils Relative: 0 %
Eosinophils Absolute: 0 10*3/uL (ref 0.0–0.5)
Eosinophils Relative: 0 %
HCT: 54.3 % — ABNORMAL HIGH (ref 39.0–52.0)
Hemoglobin: 17 g/dL (ref 13.0–17.0)
Immature Granulocytes: 1 %
Lymphocytes Relative: 9 %
Lymphs Abs: 1.3 10*3/uL (ref 0.7–4.0)
MCH: 33.1 pg (ref 26.0–34.0)
MCHC: 31.3 g/dL (ref 30.0–36.0)
MCV: 105.6 fL — ABNORMAL HIGH (ref 80.0–100.0)
Monocytes Absolute: 1.8 10*3/uL — ABNORMAL HIGH (ref 0.1–1.0)
Monocytes Relative: 12 %
Neutro Abs: 11.7 10*3/uL — ABNORMAL HIGH (ref 1.7–7.7)
Neutrophils Relative %: 78 %
Platelets: 207 10*3/uL (ref 150–400)
RBC: 5.14 MIL/uL (ref 4.22–5.81)
RDW: 12.1 % (ref 11.5–15.5)
WBC: 15 10*3/uL — ABNORMAL HIGH (ref 4.0–10.5)
nRBC: 0.2 % (ref 0.0–0.2)

## 2021-02-04 LAB — I-STAT CHEM 8, ED
BUN: 29 mg/dL — ABNORMAL HIGH (ref 6–20)
Calcium, Ion: 0.98 mmol/L — ABNORMAL LOW (ref 1.15–1.40)
Chloride: 110 mmol/L (ref 98–111)
Creatinine, Ser: 3.3 mg/dL — ABNORMAL HIGH (ref 0.61–1.24)
Glucose, Bld: 87 mg/dL (ref 70–99)
HCT: 53 % — ABNORMAL HIGH (ref 39.0–52.0)
Hemoglobin: 18 g/dL — ABNORMAL HIGH (ref 13.0–17.0)
Potassium: 6.5 mmol/L (ref 3.5–5.1)
Sodium: 142 mmol/L (ref 135–145)
TCO2: 22 mmol/L (ref 22–32)

## 2021-02-04 LAB — COMPREHENSIVE METABOLIC PANEL
ALT: 79 U/L — ABNORMAL HIGH (ref 0–44)
ALT: 257 U/L — ABNORMAL HIGH (ref 0–44)
ALT: 525 U/L — ABNORMAL HIGH (ref 0–44)
AST: 143 U/L — ABNORMAL HIGH (ref 15–41)
AST: 724 U/L — ABNORMAL HIGH (ref 15–41)
AST: 1669 U/L — ABNORMAL HIGH (ref 15–41)
Albumin: 4 g/dL (ref 3.5–5.0)
Albumin: 3.8 g/dL (ref 3.5–5.0)
Albumin: 3 g/dL — ABNORMAL LOW (ref 3.5–5.0)
Alkaline Phosphatase: 91 U/L (ref 38–126)
Alkaline Phosphatase: 77 U/L (ref 38–126)
Alkaline Phosphatase: 72 U/L (ref 38–126)
Anion gap: 19 — ABNORMAL HIGH (ref 5–15)
Anion gap: 20 — ABNORMAL HIGH (ref 5–15)
Anion gap: 11 (ref 5–15)
BUN: 18 mg/dL (ref 6–20)
BUN: 20 mg/dL (ref 6–20)
BUN: 23 mg/dL — ABNORMAL HIGH (ref 6–20)
CO2: 18 mmol/L — ABNORMAL LOW (ref 22–32)
CO2: 15 mmol/L — ABNORMAL LOW (ref 22–32)
CO2: 22 mmol/L (ref 22–32)
Calcium: 9 mg/dL (ref 8.9–10.3)
Calcium: 8.3 mg/dL — ABNORMAL LOW (ref 8.9–10.3)
Calcium: 8 mg/dL — ABNORMAL LOW (ref 8.9–10.3)
Chloride: 103 mmol/L (ref 98–111)
Chloride: 107 mmol/L (ref 98–111)
Chloride: 103 mmol/L (ref 98–111)
Creatinine, Ser: 3.46 mg/dL — ABNORMAL HIGH (ref 0.61–1.24)
Creatinine, Ser: 3.22 mg/dL — ABNORMAL HIGH (ref 0.61–1.24)
Creatinine, Ser: 2.39 mg/dL — ABNORMAL HIGH (ref 0.61–1.24)
GFR, Estimated: 21 mL/min — ABNORMAL LOW (ref >60)
GFR, Estimated: 23 mL/min — ABNORMAL LOW (ref >60)
GFR, Estimated: 33 mL/min — ABNORMAL LOW (ref >60)
Glucose, Bld: 131 mg/dL — ABNORMAL HIGH (ref 70–99)
Glucose, Bld: 87 mg/dL (ref 70–99)
Glucose, Bld: 132 mg/dL — ABNORMAL HIGH (ref 70–99)
Potassium: 5.2 mmol/L — ABNORMAL HIGH (ref 3.5–5.1)
Potassium: 6.3 mmol/L (ref 3.5–5.1)
Potassium: 5 mmol/L (ref 3.5–5.1)
Sodium: 140 mmol/L (ref 135–145)
Sodium: 142 mmol/L (ref 135–145)
Sodium: 136 mmol/L (ref 135–145)
Total Bilirubin: 0.8 mg/dL (ref 0.3–1.2)
Total Bilirubin: 0.5 mg/dL (ref 0.3–1.2)
Total Bilirubin: 1 mg/dL (ref 0.3–1.2)
Total Protein: 7.7 g/dL (ref 6.5–8.1)
Total Protein: 7.5 g/dL (ref 6.5–8.1)
Total Protein: 6.4 g/dL — ABNORMAL LOW (ref 6.5–8.1)

## 2021-02-04 LAB — BLOOD GAS, VENOUS
Acid-Base Excess: 1.4 mmol/L (ref 0.0–2.0)
Bicarbonate: 26.5 mmol/L (ref 20.0–28.0)
Drawn by: 1444
FIO2: 44
O2 Saturation: 77 %
Patient temperature: 37
pCO2, Ven: 49.5 mmHg (ref 44.0–60.0)
pH, Ven: 7.348 (ref 7.250–7.430)
pO2, Ven: 45.4 mmHg — ABNORMAL HIGH (ref 32.0–45.0)

## 2021-02-04 LAB — I-STAT ARTERIAL BLOOD GAS, ED
Acid-base deficit: 11 mmol/L — ABNORMAL HIGH (ref 0.0–2.0)
Bicarbonate: 17.7 mmol/L — ABNORMAL LOW (ref 20.0–28.0)
Calcium, Ion: 1.21 mmol/L (ref 1.15–1.40)
HCT: 51 % (ref 39.0–52.0)
Hemoglobin: 17.3 g/dL — ABNORMAL HIGH (ref 13.0–17.0)
O2 Saturation: 92 %
Patient temperature: 96.2
Potassium: 4.9 mmol/L (ref 3.5–5.1)
Sodium: 141 mmol/L (ref 135–145)
TCO2: 19 mmol/L — ABNORMAL LOW (ref 22–32)
pCO2 arterial: 46.1 mmHg (ref 32.0–48.0)
pH, Arterial: 7.184 — CL (ref 7.350–7.450)
pO2, Arterial: 76 mmHg — ABNORMAL LOW (ref 83.0–108.0)

## 2021-02-04 LAB — LACTATE DEHYDROGENASE: LDH: 5196 U/L — ABNORMAL HIGH (ref 98–192)

## 2021-02-04 LAB — I-STAT VENOUS BLOOD GAS, ED
Acid-base deficit: 10 mmol/L — ABNORMAL HIGH (ref 0.0–2.0)
Acid-base deficit: 8 mmol/L — ABNORMAL HIGH (ref 0.0–2.0)
Bicarbonate: 19 mmol/L — ABNORMAL LOW (ref 20.0–28.0)
Bicarbonate: 21.3 mmol/L (ref 20.0–28.0)
Calcium, Ion: 1.02 mmol/L — ABNORMAL LOW (ref 1.15–1.40)
Calcium, Ion: 0.97 mmol/L — ABNORMAL LOW (ref 1.15–1.40)
HCT: 53 % — ABNORMAL HIGH (ref 39.0–52.0)
HCT: 53 % — ABNORMAL HIGH (ref 39.0–52.0)
Hemoglobin: 18 g/dL — ABNORMAL HIGH (ref 13.0–17.0)
Hemoglobin: 18 g/dL — ABNORMAL HIGH (ref 13.0–17.0)
O2 Saturation: 86 %
O2 Saturation: 45 %
Potassium: 5.4 mmol/L — ABNORMAL HIGH (ref 3.5–5.1)
Potassium: 6.2 mmol/L — ABNORMAL HIGH (ref 3.5–5.1)
Sodium: 140 mmol/L (ref 135–145)
Sodium: 142 mmol/L (ref 135–145)
TCO2: 21 mmol/L — ABNORMAL LOW (ref 22–32)
TCO2: 23 mmol/L (ref 22–32)
pCO2, Ven: 52.4 mmHg (ref 44.0–60.0)
pCO2, Ven: 54.9 mmHg (ref 44.0–60.0)
pH, Ven: 7.168 — CL (ref 7.250–7.430)
pH, Ven: 7.197 — CL (ref 7.250–7.430)
pO2, Ven: 66 mmHg — ABNORMAL HIGH (ref 32.0–45.0)
pO2, Ven: 31 mmHg — CL (ref 32.0–45.0)

## 2021-02-04 LAB — CK
Total CK: 6758 U/L — ABNORMAL HIGH (ref 49–397)
Total CK: >50000 U/L — ABNORMAL HIGH (ref 49–397)

## 2021-02-04 LAB — RAPID URINE DRUG SCREEN, HOSP PERFORMED
Amphetamines: NOT DETECTED
Barbiturates: NOT DETECTED
Benzodiazepines: NOT DETECTED
Cocaine: NOT DETECTED
Opiates: NOT DETECTED
Tetrahydrocannabinol: NOT DETECTED

## 2021-02-04 LAB — URINALYSIS, ROUTINE W REFLEX MICROSCOPIC
Bilirubin Urine: NEGATIVE
Glucose, UA: NEGATIVE mg/dL
Ketones, ur: NEGATIVE mg/dL
Leukocytes,Ua: NEGATIVE
Nitrite: NEGATIVE
Protein, ur: 30 mg/dL — AB
Specific Gravity, Urine: 1.003 — ABNORMAL LOW (ref 1.005–1.030)
pH: 6 (ref 5.0–8.0)

## 2021-02-04 LAB — PROTIME-INR
INR: 1.2 (ref 0.8–1.2)
Prothrombin Time: 14.6 seconds (ref 11.4–15.2)

## 2021-02-04 LAB — MAGNESIUM: Magnesium: 2.5 mg/dL — ABNORMAL HIGH (ref 1.7–2.4)

## 2021-02-04 LAB — ECHOCARDIOGRAM COMPLETE
AR max vel: 1.88 cm2
AV Area VTI: 1.82 cm2
AV Area mean vel: 1.72 cm2
AV Mean grad: 6 mmHg
AV Peak grad: 11 mmHg
Ao pk vel: 1.66 m/s
Area-P 1/2: 3.37 cm2
Height: 64 in
S' Lateral: 2.2 cm
Weight: 3668.45 oz

## 2021-02-04 LAB — AMYLASE: Amylase: 356 U/L — ABNORMAL HIGH (ref 28–100)

## 2021-02-04 LAB — BETA-HYDROXYBUTYRIC ACID: Beta-Hydroxybutyric Acid: 0.2 mmol/L (ref 0.05–0.27)

## 2021-02-04 LAB — APTT: aPTT: 30 seconds (ref 24–36)

## 2021-02-04 LAB — PROCALCITONIN: Procalcitonin: 8.88 ng/mL

## 2021-02-04 LAB — PHOSPHORUS: Phosphorus: 5.1 mg/dL — ABNORMAL HIGH (ref 2.5–4.6)

## 2021-02-04 LAB — BRAIN NATRIURETIC PEPTIDE: B Natriuretic Peptide: 40.2 pg/mL (ref 0.0–100.0)

## 2021-02-04 LAB — LACTIC ACID, PLASMA
Lactic Acid, Venous: 8.2 mmol/L (ref 0.5–1.9)
Lactic Acid, Venous: 6.3 mmol/L (ref 0.5–1.9)
Lactic Acid, Venous: 2.7 mmol/L (ref 0.5–1.9)
Lactic Acid, Venous: 2.5 mmol/L (ref 0.5–1.9)

## 2021-02-04 LAB — AMMONIA: Ammonia: 42 umol/L — ABNORMAL HIGH (ref 9–35)

## 2021-02-04 LAB — SAVE SMEAR(SSMR), FOR PROVIDER SLIDE REVIEW

## 2021-02-04 LAB — MRSA PCR SCREENING: MRSA by PCR: NEGATIVE

## 2021-02-04 LAB — LIPASE, BLOOD: Lipase: 30 U/L (ref 11–51)

## 2021-02-04 LAB — OSMOLALITY: Osmolality: 304 mOsm/kg — ABNORMAL HIGH (ref 275–295)

## 2021-02-04 LAB — GLUCOSE, CAPILLARY
Glucose-Capillary: 109 mg/dL — ABNORMAL HIGH (ref 70–99)
Glucose-Capillary: 132 mg/dL — ABNORMAL HIGH (ref 70–99)
Glucose-Capillary: 138 mg/dL — ABNORMAL HIGH (ref 70–99)

## 2021-02-04 LAB — ACETAMINOPHEN LEVEL: Acetaminophen (Tylenol), Serum: <10 ug/mL — ABNORMAL LOW (ref 10–30)

## 2021-02-04 LAB — TROPONIN I (HIGH SENSITIVITY)
Troponin I (High Sensitivity): 163 ng/L (ref <18)
Troponin I (High Sensitivity): 196 ng/L (ref <18)

## 2021-02-04 LAB — RESP PANEL BY RT-PCR (FLU A&B, COVID) ARPGX2
Influenza A by PCR: NEGATIVE
Influenza B by PCR: NEGATIVE
SARS Coronavirus 2 by RT PCR: NEGATIVE

## 2021-02-04 LAB — ETHANOL: Alcohol, Ethyl (B): 84 mg/dL — ABNORMAL HIGH (ref <10)

## 2021-02-04 LAB — HIV ANTIBODY (ROUTINE TESTING W REFLEX): HIV Screen 4th Generation wRfx: NONREACTIVE

## 2021-02-04 LAB — SALICYLATE LEVEL: Salicylate Lvl: <7 mg/dL — ABNORMAL LOW (ref 7.0–30.0)

## 2021-02-04 MED ORDER — VANCOMYCIN VARIABLE DOSE PER UNSTABLE RENAL FUNCTION (PHARMACIST DOSING): Status: DC

## 2021-02-04 MED ORDER — HEPARIN SODIUM (PORCINE) 5000 UNIT/ML IJ SOLN
5000.0000 [IU] | Freq: Three times a day (TID) | INTRAMUSCULAR | Status: DC
  Administered 2021-02-04 – 2021-02-11 (×22): 5000 [IU] via SUBCUTANEOUS
  Filled 2021-02-04 (×21): qty 1

## 2021-02-04 MED ORDER — LACTATED RINGERS IV SOLN: INTRAVENOUS | Status: DC

## 2021-02-04 MED ORDER — ORAL CARE MOUTH RINSE
15.0000 mL | Freq: Two times a day (BID) | OROMUCOSAL | Status: DC
  Administered 2021-02-04 – 2021-02-05 (×3): 15 mL via OROMUCOSAL

## 2021-02-04 MED ORDER — LORAZEPAM 2 MG/ML IJ SOLN
1.0000 mg | Freq: Once | INTRAMUSCULAR | Status: AC
  Administered 2021-02-04: 1 mg via INTRAVENOUS
  Filled 2021-02-04: qty 1

## 2021-02-04 MED ORDER — SODIUM CHLORIDE 0.9 % IV SOLN
2.0000 g | Freq: Once | INTRAVENOUS | Status: AC
  Administered 2021-02-04: 2 g via INTRAVENOUS
  Filled 2021-02-04: qty 2

## 2021-02-04 MED ORDER — DOCUSATE SODIUM 100 MG PO CAPS
100.0000 mg | ORAL_CAPSULE | Freq: Two times a day (BID) | ORAL | Status: DC | PRN
  Administered 2021-02-05: 100 mg via ORAL
  Filled 2021-02-04: qty 1

## 2021-02-04 MED ORDER — LACTATED RINGERS IV BOLUS
1000.0000 mL | Freq: Once | INTRAVENOUS | Status: AC
  Administered 2021-02-04: 1000 mL via INTRAVENOUS

## 2021-02-04 MED ORDER — METRONIDAZOLE IN NACL 5-0.79 MG/ML-% IV SOLN
500.0000 mg | Freq: Once | INTRAVENOUS | Status: AC
  Administered 2021-02-04: 500 mg via INTRAVENOUS
  Filled 2021-02-04: qty 100

## 2021-02-04 MED ORDER — SODIUM ZIRCONIUM CYCLOSILICATE 10 G PO PACK
10.0000 g | PACK | Freq: Three times a day (TID) | ORAL | Status: DC
  Administered 2021-02-04: 10 g via ORAL
  Filled 2021-02-04 (×2): qty 1

## 2021-02-04 MED ORDER — LACTATED RINGERS IV BOLUS (SEPSIS)
1000.0000 mL | Freq: Once | INTRAVENOUS | Status: AC
  Administered 2021-02-04: 1000 mL via INTRAVENOUS

## 2021-02-04 MED ORDER — DEXTROSE 50 % IV SOLN
1.0000 | Freq: Once | INTRAVENOUS | Status: AC
  Administered 2021-02-04: 50 mL via INTRAVENOUS
  Filled 2021-02-04: qty 50

## 2021-02-04 MED ORDER — CHLORHEXIDINE GLUCONATE CLOTH 2 % EX PADS
6.0000 | MEDICATED_PAD | Freq: Every day | CUTANEOUS | Status: DC
  Administered 2021-02-04 – 2021-02-09 (×6): 6 via TOPICAL

## 2021-02-04 MED ORDER — SODIUM CHLORIDE 0.9 % IV SOLN
2.0000 g | INTRAVENOUS | Status: DC
  Administered 2021-02-05: 2 g via INTRAVENOUS
  Filled 2021-02-04: qty 2

## 2021-02-04 MED ORDER — POLYETHYLENE GLYCOL 3350 17 G PO PACK
17.0000 g | PACK | Freq: Every day | ORAL | Status: DC | PRN
  Administered 2021-02-05: 17 g via ORAL
  Filled 2021-02-04: qty 1

## 2021-02-04 MED ORDER — INSULIN ASPART 100 UNIT/ML IV SOLN
5.0000 [IU] | Freq: Once | INTRAVENOUS | Status: AC
  Administered 2021-02-04: 5 [IU] via INTRAVENOUS

## 2021-02-04 MED ORDER — FENTANYL CITRATE (PF) 100 MCG/2ML IJ SOLN
25.0000 ug | INTRAMUSCULAR | Status: DC | PRN
  Administered 2021-02-04 – 2021-02-07 (×24): 50 ug via INTRAVENOUS
  Filled 2021-02-04 (×23): qty 2

## 2021-02-04 MED ORDER — ONDANSETRON HCL 4 MG/2ML IJ SOLN
4.0000 mg | Freq: Four times a day (QID) | INTRAMUSCULAR | Status: DC | PRN
  Administered 2021-02-05: 4 mg via INTRAVENOUS
  Filled 2021-02-04: qty 2

## 2021-02-04 MED ORDER — SODIUM ZIRCONIUM CYCLOSILICATE 10 G PO PACK
10.0000 g | PACK | Freq: Once | ORAL | Status: AC
  Administered 2021-02-04: 10 g via ORAL
  Filled 2021-02-04: qty 1

## 2021-02-04 MED ORDER — METOPROLOL TARTRATE 12.5 MG HALF TABLET
12.5000 mg | ORAL_TABLET | Freq: Two times a day (BID) | ORAL | Status: DC
  Administered 2021-02-04 – 2021-02-11 (×14): 12.5 mg via ORAL
  Filled 2021-02-04 (×15): qty 1

## 2021-02-04 MED ORDER — VANCOMYCIN HCL 2000 MG/400ML IV SOLN
2000.0000 mg | Freq: Once | INTRAVENOUS | Status: AC
  Administered 2021-02-04: 2000 mg via INTRAVENOUS
  Filled 2021-02-04: qty 400

## 2021-02-04 MED ORDER — STERILE WATER FOR INJECTION IV SOLN
INTRAVENOUS | Status: DC
  Filled 2021-02-04 (×6): qty 850

## 2021-02-04 NOTE — ED Triage Notes (Signed)
Per EMS, pt from home, was called due to unresponsiveness w/ snoring respirations.  Friend was attempting to do compressions on pt.  Given .4 of narcan, began screaming.  Will not answer questions.   86/52 120HR 83% non re breather CBG 233  22R AC

## 2021-02-04 NOTE — ED Provider Notes (Addendum)
Handoff received from Dr. Blinda Leatherwood 45 year old male history of hypertension presents today with altered mental status.  Last known normal last night approximately 10 PM.  He had the day off yesterday and ran some errands.  He reports that he remembers coming home and going to bed.  His significant other states around 1030 he left and went to the other room.  This morning his alarm was going off at about 5 AM.  She went to check on him and found him unresponsive.  EMS responded and I digitally sonorous upper respirations.  He received Narcan yelling.  He continued confused.  He was seen and evaluated here in the ED with labs and head CT.  He has become increasingly responsive but still is moaning and saying he has pain.  He states the pain is all over but has more pain in his left hip.  Became Physical Exam  BP 104/79   Pulse (!) 108   Temp (!) 96.2 F (35.7 C) (Oral)   Resp (!) 29   Ht 1.651 m (5\' 5" )   Wt 102.1 kg   SpO2 95%   BMI 37.44 kg/m   Physical Exam Obese male appears in distress Lungs cta cv tachy Abdomen distended and ttp Pelvis ttp especially over left hip Pulses intact all extremities    ED Course/Procedures     Procedures CT head and neck resulted reviewed without acute abnormalities ABG has resulted pH of 7.18 PCO2 of 46 and PO2 of 76 Patient's current oxygen saturations are 97% on nonrebreather Chest x-Ijanae Macapagal reviewed and perihilar opacity consistent with pneumonia or aspiration noted Code sepsis activated at 0725- cxr with ? Site of infection, temp 96.2, hr 107, lacate >, creatinine elevated,  Given patient's tachycardia, low temperature, and hypotension will cover with broad-spectrum antibiotics Troponin elevated at 163- by report patient had chest compressions although no loss of pulses noted, initial ekg with some st elevation avI and avl, This appears resolved on repeat ekg although some mild elevation v2-v4.  No specific complaints of chest pain.  Will discuss with  cardiology. 8:41 AM BP127/93 Temp 97.3 Hr 127 NRB in place sats 100%   MDM   Patient with distended abdomen and ho diverticulitis with perf- will obtain ct Creatinine increased to 3.4- last normal in 2017- iv fluids infusing Potassium-5.6- patient with iv fluids infusing and will recheck Lactic elevated- unclear etiology- fluids and broad spectrum abx ordered, also consider seizure and hypotension of other etiologies. Significant other states he had a drink last night but states he only drinks occasionally- etoh here 55  Cardiology will see in consult  Repeat vbg continues acidotic with ph 7.19/54/31 on nrb sats upper 90s Potassium increasing from 5.2 to 6.5- insulin and dextrose ordered discussed with critical care and they will see for admission CRITICAL CARE Performed by: 06-04-2006 Total critical care time: 75 minutes Critical care time was exclusive of separately billable procedures and treating other patients. Critical care was necessary to treat or prevent imminent or life-threatening deterioration. Critical care was time spent personally by me on the following activities: development of treatment plan with patient and/or surrogate as well as nursing, discussions with consultants, evaluation of patient's response to treatment, examination of patient, obtaining history from patient or surrogate, ordering and performing treatments and interventions, ordering and review of laboratory studies, ordering and review of radiographic studies, pulse oximetry and re-evaluation of patient's condition.  1- altered mental status- ?seizure, metabolic encephalopathy, trauma, intoxication, vs toxic- etoh 82, uds  negative 2 hypoxia- infiltrate noted on cxr 3- intrabdominal process- patient with distended abdomen, ho perforated diverticulitis, today with  Focal wall thickening around proximal sigmoid colon- ?diverticulitis 4- new renal failure 5 metabolic acidosis6.6>5.2>3.8(historic) 6-  sepsis? Patient with elevated lactic acid, decreasing 6.3>8.2, broad spectrum abx and fluids given 7-hyperkalemia- ?secondary to #$- potassium   Covid negative Ck still pending Discussed with critical care at bedside and care assumed   Reviewed ck and elevated at >6000 8- rhabdomyolisis Margarita Grizzle, MD 02/04/21 1055    Margarita Grizzle, MD 02/04/21 1210

## 2021-02-04 NOTE — ED Notes (Signed)
Pt O2 89% on room air. Pt placed on 6L via [place]. SpO2 currently 95% on 6L.

## 2021-02-04 NOTE — ED Notes (Signed)
Pt is much more alert, but still unable to give detail of last night.

## 2021-02-04 NOTE — Progress Notes (Addendum)
Pharmacy Antibiotic Note  Shane Guzman is a 45 y.o. male admitted on 02/04/2021 with altered mental status.  Pharmacy has been consulted for vancomycin and cefepime dosing for sepsis - source unknown. Chest x-ray with perihilar opacity - atelectasis or aspiration. Scr 3.46 with current CrCl ~29 ml/min. Baseline Scr unknown - last 1.3 in 2017. No Scr data in Care Everywhere. WBC 15. Temp 96.2. LA 8.2.  Plan: Vancomycin 2000mg  x1 loading dose  Vancomycin dosing by random level due to unknown baseline renal function but possibly in AKI Cefepime 2g IV q24h  Monitor renal function, cultures, infectious work up, and clinical progression  Height: 5\' 5"  (165.1 cm) Weight: 102.1 kg (225 lb) IBW/kg (Calculated) : 61.5  Temp (24hrs), Avg:96.2 F (35.7 C), Min:96.2 F (35.7 C), Max:96.2 F (35.7 C)  No results for input(s): WBC, CREATININE, LATICACIDVEN, VANCOTROUGH, VANCOPEAK, VANCORANDOM, GENTTROUGH, GENTPEAK, GENTRANDOM, TOBRATROUGH, TOBRAPEAK, TOBRARND, AMIKACINPEAK, AMIKACINTROU, AMIKACIN in the last 168 hours.  CrCl cannot be calculated (Patient's most recent lab result is older than the maximum 21 days allowed.).    Allergies  Allergen Reactions  . Lisinopril Swelling    Antimicrobials this admission: Vancomycin 2/18 >>  Cefepime 2/18 >>   Dose adjustments this admission: N/a  Microbiology results: 2/18 Bcx:  2/18 Ucx:   Thank you for allowing pharmacy to be a part of this patient's care.  3/18, PharmD Clinical Pharmacist  02/04/2021 7:28 AM

## 2021-02-04 NOTE — Progress Notes (Signed)
ABI's have been completed. Preliminary results can be found in CV Proc through chart review.   02/04/21 4:14 PM Olen Cordial RVT

## 2021-02-04 NOTE — Sepsis Progress Note (Signed)
eLink is monitoring this Code Sepsis. °

## 2021-02-04 NOTE — Progress Notes (Addendum)
PCCM INTERVAL PROGRESS NOTE   Called to bedside by RN as patient is complaining of left lower extremity pain and lack of sensation. Upon my arrival patient is complaining for L > R flank pain, which is consistent with his complaints this morning prompting pelvis Xray and abdominal/pelvic CT, without obvious source for pain.   No alleviating factors, aggravated by movement.   Patient unable to move L lower extremity from his ankle down. No sensation in the foot besides paresthesia. Foot is notably cool to touch more-so than the right and I could not manually palpate a pulse. Pulses are doppler-able. Arterial duplex ordered.   Dr Delton Coombes and touched base with neurology who are recommending MRI brain to rule out focal CVA and Echocardiogram to rule out embolic event.  Patient will not tolerate MRI at this time.   Will prescribe low dose fentanyl for pain and order clear liquid diet.    Joneen Roach, AGACNP-BC [facility]  See Amion for personal pager PCCM on call pager [phone] until 7pm. Please call Elink 7p-7a. [phone]  02/04/2021 2:58 PM

## 2021-02-04 NOTE — Consult Note (Addendum)
Reason for Consult: Acute kidney injury, hyperkalemia, anion gap metabolic acidosis Referring Physician: Levy Pupaobert Byrum, MD (CCM)  HPI:  45 year old African-American man with past medical history significant for hypertension, obstructive sleep apnea versus OHS (on CPAP), ongoing tobacco use, ongoing alcohol use, history of sigmoid diverticulitis and baseline creatinine reportedly around 1.3 several months ago. Brought to the emergency room after found unresponsive by his girlfriend at home this morning in a diaphoretic state-last seen well last night when he consumed some alcohol. Upon initial contact by EMS, found to have pinpoint pupils with agonal breathing and hypoxia; given naloxone with some improvement of his mental status prior to arrival to ED. In the emergency room he was awake and agitated for which lorazepam was administered. Subsequent labs were significant for elevated AST/ALT, lactic acidosis, hyperkalemia, metabolic acidosis and acute kidney injury with a creatinine of 3.5. Pancultures were checked and empiric cefepime/vancomycin administered.  Concern is raised that follow-up labs showed hyperkalemia 6.3, anion gap metabolic acidosis, elevated LFTs, elevated CPK of greater than 50,000 and elevated phosphorus.  He was on valsartan/hydrochlorothiazide prior to admission along with terbinafine 250 mg daily for athlete's foot.  This afternoon developed increasing left lower extremity pain/paresthesia/numbness.  Past Medical History:  Diagnosis Date  . Diverticulitis of intestine with abscess   . Heart murmur   . Hypertension   . OSA on CPAP     Past Surgical History:  Procedure Laterality Date  . ACHILLES TENDON REPAIR  06/2010   left; S/P tendon rupture    Family History  Problem Relation Age of Onset  . Diverticulitis Maternal Grandmother     Social History:  reports that he has been smoking cigarettes. He has been smoking about 0.00 packs per day for the past 18.00 years. He  has never used smokeless tobacco. He reports current alcohol use. He reports that he does not use drugs.  Allergies:  Allergies  Allergen Reactions  . Lisinopril Swelling    Medications:  Scheduled: . heparin  5,000 Units Subcutaneous Q8H  . mouth rinse  15 mL Mouth Rinse BID  . sodium zirconium cyclosilicate  10 g Oral TID  . vancomycin variable dose per unstable renal function (pharmacist dosing)   Does not apply See admin instructions    BMP Latest Ref Rng & Units 02/04/2021 02/04/2021 02/04/2021  Glucose 70 - 99 mg/dL 87 87 -  BUN 6 - 20 mg/dL 20 82(N29(H) -  Creatinine 0.61 - 1.24 mg/dL 5.62(Z3.22(H) 3.08(M3.30(H) -  Sodium 135 - 145 mmol/L 142 142 142  Potassium 3.5 - 5.1 mmol/L 6.3(HH) 6.5(HH) 6.2(H)  Chloride 98 - 111 mmol/L 107 110 -  CO2 22 - 32 mmol/L 15(L) - -  Calcium 8.9 - 10.3 mg/dL 8.3(L) - -   CBC Latest Ref Rng & Units 02/04/2021 02/04/2021 02/04/2021  WBC 4.0 - 10.5 K/uL - - -  Hemoglobin 13.0 - 17.0 g/dL 18.0(H) 18.0(H) 17.3(H)  Hematocrit 39.0 - 52.0 % 53.0(H) 53.0(H) 51.0  Platelets 150 - 400 K/uL - - -   Urinalysis    Component Value Date/Time   COLORURINE YELLOW 02/04/2021 0818   APPEARANCEUR CLEAR 02/04/2021 0818   LABSPEC 1.003 (L) 02/04/2021 0818   PHURINE 6.0 02/04/2021 0818   GLUCOSEU NEGATIVE 02/04/2021 0818   HGBUR LARGE (A) 02/04/2021 0818   BILIRUBINUR NEGATIVE 02/04/2021 0818   KETONESUR NEGATIVE 02/04/2021 0818   PROTEINUR 30 (A) 02/04/2021 0818   UROBILINOGEN 1.0 06/12/2015 2102   NITRITE NEGATIVE 02/04/2021 0818   LEUKOCYTESUR NEGATIVE 02/04/2021  0818    CT ABDOMEN PELVIS WO CONTRAST  Result Date: 02/04/2021 CLINICAL DATA:  Acute generalized abdominal pain. EXAM: CT ABDOMEN AND PELVIS WITHOUT CONTRAST TECHNIQUE: Multidetector CT imaging of the abdomen and pelvis was performed following the standard protocol without IV contrast. COMPARISON:  June 12, 2015. FINDINGS: Lower chest: Mild bilateral posterior basilar subsegmental atelectasis or infiltrates  are noted. Hepatobiliary: No focal liver abnormality is seen. No gallstones, gallbladder wall thickening, or biliary dilatation. Pancreas: Unremarkable. No pancreatic ductal dilatation or surrounding inflammatory changes. Spleen: Normal in size without focal abnormality. Adrenals/Urinary Tract: Adrenal glands and kidneys appear normal. No hydronephrosis or renal obstruction is noted. No renal or ureteral calculi are noted. Urinary bladder is decompressed secondary to Foley catheter. Stomach/Bowel: Stomach is within normal limits. Appendix appears normal. No evidence of bowel obstruction. Sigmoid diverticulosis is noted. There is focal wall thickening involving the proximal sigmoid colon with minimal surrounding inflammatory changes; is uncertain if this represents acute diverticulitis or sequela of previous such inflammation. Vascular/Lymphatic: No significant vascular findings are present. No enlarged abdominal or pelvic lymph nodes. Reproductive: Prostate is unremarkable. Other: No abdominal wall hernia or abnormality. No abdominopelvic ascites. Musculoskeletal: No acute or significant osseous findings. IMPRESSION: 1. Mild bilateral posterior basilar subsegmental atelectasis or infiltrates are noted. 2. Focal wall thickening is seen involving the proximal sigmoid colon with minimal surrounding inflammatory changes; it is uncertain if this represents acute diverticulitis or sequela of previous such inflammation. Electronically Signed   By: Lupita Raider M.D.   On: 02/04/2021 09:28   CT HEAD WO CONTRAST  Result Date: 02/04/2021 CLINICAL DATA:  Neck trauma, intoxicated or obtained EXAM: CT HEAD WITHOUT CONTRAST CT CERVICAL SPINE WITHOUT CONTRAST TECHNIQUE: Multidetector CT imaging of the head and cervical spine was performed following the standard protocol without intravenous contrast. Multiplanar CT image reconstructions of the cervical spine were also generated. COMPARISON:  None. FINDINGS: CT HEAD FINDINGS  Brain: No evidence of acute infarction, hemorrhage, hydrocephalus, extra-axial collection or mass lesion/mass effect. Vascular: No focal hyperdense vessel or unexpected calcification. Skull: Normal. Negative for fracture or focal lesion. Sinuses/Orbits: No acute finding. CT CERVICAL SPINE FINDINGS Alignment: Normal. Skull base and vertebrae: No acute fracture. No primary bone lesion or focal pathologic process. Soft tissues and spinal canal: No prevertebral fluid or swelling. No visible canal hematoma. Disc levels:  Mid cervical degenerative disc narrowing and ridging. Upper chest: Atelectasis seen in the right upper lobe. IMPRESSION: No acute finding in the head or cervical spine. Electronically Signed   By: Marnee Spring M.D.   On: 02/04/2021 07:13   CT CERVICAL SPINE WO CONTRAST  Result Date: 02/04/2021 CLINICAL DATA:  Neck trauma, intoxicated or obtained EXAM: CT HEAD WITHOUT CONTRAST CT CERVICAL SPINE WITHOUT CONTRAST TECHNIQUE: Multidetector CT imaging of the head and cervical spine was performed following the standard protocol without intravenous contrast. Multiplanar CT image reconstructions of the cervical spine were also generated. COMPARISON:  None. FINDINGS: CT HEAD FINDINGS Brain: No evidence of acute infarction, hemorrhage, hydrocephalus, extra-axial collection or mass lesion/mass effect. Vascular: No focal hyperdense vessel or unexpected calcification. Skull: Normal. Negative for fracture or focal lesion. Sinuses/Orbits: No acute finding. CT CERVICAL SPINE FINDINGS Alignment: Normal. Skull base and vertebrae: No acute fracture. No primary bone lesion or focal pathologic process. Soft tissues and spinal canal: No prevertebral fluid or swelling. No visible canal hematoma. Disc levels:  Mid cervical degenerative disc narrowing and ridging. Upper chest: Atelectasis seen in the right upper lobe. IMPRESSION: No acute finding  in the head or cervical spine. Electronically Signed   By: Marnee Spring  M.D.   On: 02/04/2021 07:13   US RENAL  Result Date: 02/04/2021 CLINICAL DATA:  Acute renal failure. EXAM: RENAL / URINARY TRACT ULTRASOUND COMPLETE COMPARISON:  CT abdomen and pelvis today. FINDINGS: Right Kidney: Renal measurements: 11.6 x 5.7 x 5.3 cm = volume: 183.5 mL. Echogenicity within normal limits. No mass or hydronephrosis visualized. Left Kidney: Renal measurements: 12.1 x 6.5 x 6.0 cm = volume: 247 mL. Echogenicity within normal limits. No mass or hydronephrosis visualized. Bladder: Not visualized. Note is made that the urinary bladder is completely decompressed with a Foley catheter in place on the comparison CT. Other: None. IMPRESSION: Negative for hydronephrosis.  Normal appearing kidneys. Electronically Signed   By: Drusilla Kanner M.D.   On: 02/04/2021 13:11   DG Pelvis Portable  Result Date: 02/04/2021 CLINICAL DATA:  Altered mental status.  Left hip pain. EXAM: PORTABLE PELVIS 1-2 VIEWS COMPARISON:  None. FINDINGS: There is no evidence of pelvic fracture or diastasis. No pelvic bone lesions are seen. IMPRESSION: Negative. Electronically Signed   By: Marnee Spring M.D.   On: 02/04/2021 07:33   DG Chest Port 1 View  Result Date: 02/04/2021 CLINICAL DATA:  Unresponsive EXAM: PORTABLE CHEST 1 VIEW COMPARISON:  08/06/2011 FINDINGS: Borderline heart size accentuated by technique. Indistinct perihilar density. No visible effusion or pneumothorax. IMPRESSION: Perihilar opacity with history favoring atelectasis or aspiration. Electronically Signed   By: Marnee Spring M.D.   On: 02/04/2021 06:44   DG Abd Portable 1 View  Result Date: 02/04/2021 CLINICAL DATA:  NG tube placement. EXAM: PORTABLE ABDOMEN - 1 VIEW COMPARISON:  11/07/2007. FINDINGS: 1018 hours. NG tube tip is in the mid stomach. Proximal side port of the NG tube is just above the GE junction. Visualized upper abdomen demonstrates nonspecific gas pattern. IMPRESSION: NG tube tip is in the mid stomach although proximal side  port is above the GE junction. Tube could be advanced approximately 5-6 cm for placement of the side port below the GE junction. Electronically Signed   By: Kennith Center M.D.   On: 02/04/2021 10:30    Review of Systems  Unable to perform ROS: Other (Just got narcotic analgesics-somnolent)  Musculoskeletal: Positive for myalgias.       Reports some pain over left hip area   Blood pressure (!) 159/125, pulse 100, temperature 98.96 F (37.2 C), resp. rate (!) 22, height 5\' 4"  (1.626 m), weight 104 kg, SpO2 (!) 89 %. Physical Exam Vitals and nursing note reviewed.  Constitutional:      Appearance: Normal appearance. He is obese.     Comments: Somnolent-awakens to palpation of his left thigh  HENT:     Head: Normocephalic and atraumatic.     Right Ear: External ear normal.     Left Ear: External ear normal.     Nose: Nose normal.     Comments: Nasogastric tube in situ Eyes:     Conjunctiva/sclera: Conjunctivae normal.  Cardiovascular:     Rate and Rhythm: Regular rhythm. Tachycardia present.     Heart sounds: Normal heart sounds. No murmur heard.   Pulmonary:     Effort: Pulmonary effort is normal.  Abdominal:     General: Bowel sounds are normal. There is distension.     Palpations: Abdomen is soft.     Tenderness: There is no abdominal tenderness. There is left CVA tenderness. There is no guarding.  Musculoskeletal:  General: Swelling present.     Cervical back: Normal range of motion and neck supple. No tenderness.     Right lower leg: Edema present.     Left lower leg: Edema present.     Comments: Some firm swelling appreciated over the left lateral/posterior hip and thigh    Assessment/Plan: 1.  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  He is  nonoliguric at this time but has multiple electrolyte abnormalities from rhabdomyolysis.  I will start him on isotonic sodium bicarbonate to help augment urine output/myoglobin dumping.  Explained to his grandmother via phone call that he was at high risk for needing renal replacement therapy if we are unable to rectify the metabolic abnormalities with medical management.  CT scan of the abdomen and pelvis did not show any urinary obstruction and urinalysis compatible with myoglobinuria. Avoid nephrotoxic medications including NSAIDs and iodinated intravenous contrast exposure unless the latter is absolutely indicated.  Preferred narcotic agents for pain control are hydromorphone, fentanyl, and methadone. Morphine should not be used. Avoid Baclofen and avoid oral sodium phosphate and magnesium citrate based laxatives / bowel preps. Continue strict Input and Output monitoring. Will monitor the patient closely with you and intervene or adjust therapy as indicated by changes in clinical status/labs. 2.  Rhabdomyolysis: Evidenced by physical exam-likely area affected is the left hip; his initial CT scan of the abdomen and pelvis did not show evidence of this but if CPK level continues to trend up-may need to consult surgery for fasciotomy.  Continue to trend CPK levels as well as AST/ALT which are likely muscular in etiology but cannot be discounted as hepatic with his preceding use of terbinafine and alcohol.  Started on sodium bicarbonate with hopes of alkalinizing urine/myoglobin dumping. 3.  Hyperkalemia: Secondary to acute kidney injury and rhabdomyolysis, will treat with scheduled Lokelma and begin him on sodium bicarbonate drip. 4.  Anion gap metabolic acidosis: Secondary to acute kidney injury and lactic acidosis-monitor with sodium bicarbonate drip/volume expansion. 5.  Hypertension: Hold ARB/diuretic combination at this time as we attempt volume expansion-he may need loop diuretics down the road to help  augment urine output as we will volume expand him for rhabdomyolysis.  Started on low-dose beta-blocker for blood pressure control.   Makensie Mulhall K. 02/04/2021, 3:52 PM

## 2021-02-04 NOTE — Progress Notes (Signed)
Patient arrived from ED via stretcher alert and oriented. Patient complaining about  Back pain and left leg pain and numbness. Notified Provider of these complaints.  Patient oriented to room and unit protocols. Patient and S/O at beside and explained the visitation policy. Patient stated [name] is primary contact.

## 2021-02-04 NOTE — ED Notes (Signed)
Called respiratory for ABG

## 2021-02-04 NOTE — ED Provider Notes (Signed)
[facility] EMERGENCY DEPARTMENT Provider Note   CSN: [id number] Arrival date & time: 02/04/21  6659     History Chief Complaint  Patient presents with  . Altered Mental Status    Shane Guzman is a 45 y.o. male.  Patient brought to the emergency department by ambulance from home.  EMS report that they found a friend performing CPR upon their arrival.  CPR was initiated because he would not wake up, unclear if he needed it.  EMS reports that the patient had pinpoint pupils, agonal respirations and oxygen saturations of 80% upon their arrival.  He reportedly had vomit in and around his mouth.  He did have palpable pulses.  Patient was given Narcan after which he became awake and agitated, now yelling unintelligibly. Level V Caveat due to acuity/mental status change.        Past Medical History:  Diagnosis Date  . Diverticulitis of intestine with abscess   . Heart murmur   . Hypertension   . OSA on CPAP     Patient Active Problem List   Diagnosis Date Noted  . Diverticulitis 06/13/2015  . Sigmoid diverticulitis 12/06/2011  . Diverticulitis of colon with perforation 11/22/2011  . TOBACCO USER 09/04/2009  . DIVERTICULAR DISEASE 11/18/2007  . MURMUR 11/18/2007    Past Surgical History:  Procedure Laterality Date  . ACHILLES TENDON REPAIR  06/2010   left; S/P tendon rupture       Family History  Problem Relation Age of Onset  . Diverticulitis Maternal Grandmother     Social History   Tobacco Use  . Smoking status: Current Every Day Smoker    Packs/day: 0.00    Years: 18.00    Pack years: 0.00    Types: Cigarettes  . Smokeless tobacco: Never Used  Substance Use Topics  . Alcohol use: Yes  . Drug use: No    Home Medications Prior to Admission medications   Medication Sig Start Date End Date Taking? Authorizing Provider  sildenafil (REVATIO) 20 MG tablet Take 20 mg by mouth daily as needed (ed). 08/18/20  Yes [provider]   terbinafine (LAMISIL) 250 MG tablet Take 1 tablet (250 mg total) by mouth daily. 12/28/20  Yes Hyatt, Max T, DPM  valsartan-hydrochlorothiazide (DIOVAN-HCT) 160-12.5 MG tablet Take 1 tablet by mouth daily. 01/26/19  Yes [provider]    Allergies    Lisinopril  Review of Systems   Review of Systems  Unable to perform ROS: Acuity of condition    Physical Exam Updated Vital Signs BP (!) 81/67   Pulse (!) 108   Temp (!) 96.2 F (35.7 C) (Oral)   Resp (!) 31   Ht 5\' 5"  (1.651 m)   Wt 102.1 kg   SpO2 90%   BMI 37.44 kg/m   Physical Exam Vitals and nursing note reviewed.  Constitutional:      General: He is not in acute distress.    Appearance: Normal appearance. He is well-developed and well-nourished.  HENT:     Head: Normocephalic and atraumatic.     Right Ear: Hearing normal.     Left Ear: Hearing normal.     Nose: Nose normal.     Mouth/Throat:     Mouth: Oropharynx is clear and moist and mucous membranes are normal.  Eyes:     Extraocular Movements: EOM normal.     Conjunctiva/sclera: Conjunctivae normal.     Pupils: Pupils are equal, round, and reactive to light.  Cardiovascular:  Rate and Rhythm: Regular rhythm. Tachycardia present.     Heart sounds: S1 normal and S2 normal. No murmur heard. No friction rub. No gallop.   Pulmonary:     Effort: Pulmonary effort is normal. Tachypnea present. No respiratory distress.     Breath sounds: Normal breath sounds.  Chest:     Chest wall: No tenderness.  Abdominal:     General: Bowel sounds are normal.     Palpations: Abdomen is soft. There is no hepatosplenomegaly.     Tenderness: There is no abdominal tenderness. There is no guarding or rebound. Negative signs include Murphy's sign and McBurney's sign.     Hernia: No hernia is present.  Musculoskeletal:        General: Normal range of motion.     Cervical back: Normal range of motion and neck supple.  Skin:    General: Skin is warm, dry and intact.      Findings: No rash.     Nails: There is no cyanosis.  Neurological:     General: No focal deficit present.     Mental Status: He is alert.     GCS: GCS eye subscore is 3. GCS verbal subscore is 4. GCS motor subscore is 6.     Cranial Nerves: No cranial nerve deficit.     Sensory: No sensory deficit.     Coordination: Coordination normal.     Deep Tendon Reflexes: Strength normal.  Psychiatric:        Mood and Affect: Mood and affect normal.        Speech: Speech normal.        Behavior: Behavior normal.        Thought Content: Thought content normal.     ED Results / Procedures / Treatments   Labs (all labs ordered are listed, but only abnormal results are displayed) Labs Reviewed  I-STAT ARTERIAL BLOOD GAS, ED - Abnormal; Notable for the following components:      Result Value   pH, Arterial 7.184 (*)    pO2, Arterial 76 (*)    Bicarbonate 17.7 (*)    TCO2 19 (*)    Acid-base deficit 11.0 (*)    Hemoglobin 17.3 (*)    All other components within normal limits  URINE CULTURE  CULTURE, BLOOD (ROUTINE X 2)  CULTURE, BLOOD (ROUTINE X 2)  RESP PANEL BY RT-PCR (FLU A&B, COVID) ARPGX2  CBC WITH DIFFERENTIAL/PLATELET  COMPREHENSIVE METABOLIC PANEL  LACTIC ACID, PLASMA  URINALYSIS, ROUTINE W REFLEX MICROSCOPIC  ETHANOL  BETA-HYDROXYBUTYRIC ACID  AMMONIA  SALICYLATE LEVEL  ACETAMINOPHEN LEVEL  BRAIN NATRIURETIC PEPTIDE  RAPID URINE DRUG SCREEN, HOSP PERFORMED  TROPONIN I (HIGH SENSITIVITY)    EKG EKG Interpretation  Date/Time:  Friday February 04 2021 06:20:25 EST Ventricular Rate:  109 PR Interval:    QRS Duration: 73 QT Interval:  342 QTC Calculation: 461 R Axis:   14 Text Interpretation: Sinus tachycardia Consider right atrial enlargement Consider right ventricular hypertrophy ST elevation, consider lateral injury Confirmed by Gilda Crease [id number]) on 02/04/2021 6:22:26 AM   Radiology DG Chest Port 1 View  Result Date: 02/04/2021 CLINICAL DATA:   Unresponsive EXAM: PORTABLE CHEST 1 VIEW COMPARISON:  08/06/2011 FINDINGS: Borderline heart size accentuated by technique. Indistinct perihilar density. No visible effusion or pneumothorax. IMPRESSION: Perihilar opacity with history favoring atelectasis or aspiration. Electronically Signed   By: Marnee Spring M.D.   On: 02/04/2021 06:44    Procedures Procedures   Medications Ordered in ED  Medications - No data to display  ED Course  I have reviewed the triage vital signs and the nursing notes.  Pertinent labs & imaging results that were available during my care of the patient were reviewed by me and considered in my medical decision making (see chart for details).    MDM Rules/Calculators/A&P                          Patient presented to the emergency department for unresponsive episode at home with significant altered mental status at arrival.  Etiology unclear at this time, work-up initiated and will sign out to oncoming ER physician.  Final Clinical Impression(s) / ED Diagnoses Final diagnoses:  Altered mental status, unspecified altered mental status type    Rx / DC Orders ED Discharge Orders    None       Jaleeah Slight, Canary Brim, MD 02/04/21 [phone]

## 2021-02-04 NOTE — Progress Notes (Incomplete)
  Echocardiogram 2D Echocardiogram has been performed.  Gerda Diss 02/04/2021, 5:04 PM

## 2021-02-04 NOTE — H&P (Addendum)
NAME:  MARKELLE NAJARIAN, MRN:  [id number], DOB:  1976-03-24, LOS: 0 ADMISSION DATE:  02/04/2021, CONSULTATION DATE:  02/04/2021 REFERRING MD:  Margarita Grizzle CHIEF COMPLAINT:  AMS   Brief History:  45 year old male found unresponsive at home by girlfriend with oral secretions; EMS called. EMS arrived and administered brief CPR (patient never pulseless, but with pinpoint pupils, agonal breaths, sats 80%). Narcan given with improvement in mental status. Brought to [facility] ED for further evaluation.   History of Present Illness:  Mr. Frayne is a 45 year old male with PMHx significant for HTN (well-controlled), sigmoid diverticulitis (s/p perforation 2012), OSA (on CPAP, intermittently compliant), tobacco/EtOH use who presented to [facility] via EMS 2/18 after being found unresponsive by his girlfriend at home this morning. Per girlfriend, patient's alarm was going off in the other room and she went to wake him and was unable; reports that he was "sweaty" with "froth/foam coming out of his mouth" but did have a weak carotid pulse. She called EMS and on arrival, patient had pinpoint pupils with agonal breathing and O2 sat 80%. He was not known to be pulseless for any period but brief CPR was administered. Narcan was given with improvement in mental status. Patient was subsequently brought to [facility] ED for further evaluation and workup.  On arrival to [facility] ED, patient was awake and agitated and Ativan 1mg  was administered. He subsequently became drowsy with snoring respirations, but saturating well on 6L [place]. Vitals were notable for hypotension (SBP 80s) which was volume responsive, tachycardia and O2 sats 90-95% on RA. Labs were notable for WBC 15.0, K 5.2, CO2 18, Cr 3.46, lactic acid 8.2, AST/ALT 143/79, slightly elevated troponins. BCx/UCx/resp panel were collected. Empiric Cefepime/Vanc x 1 were administered. PCCM was consulted for evaluation/admission.  History is obtained from patient's girlfriend ([name]) and briefly from  patient, though he remains drowsy. Denies fever/chills, CP/SOB. Endorses nausea with vomiting (per girlfriend, patient had been intermittently vomiting since 2/16, none last night that she is aware of). Denies diarrhea or significant abdominal pain. Denies dysuria, urinary hesitancy, or hematuria prior to admission.   Past Medical History:  HTN (well-controlled), sigmoid diverticulitis (s/p perforation 2012), OSA (on CPAP, intermittently compliant), tobacco/EtOH use  Significant Hospital Events:  2/18 Presented to [facility] ED with AMS, ARF, hypoxia, PCCM consulted for admission  Consults:    Procedures:    Significant Diagnostic Tests:   2/18 CXR >> Perihilar opacity with history favoring atelectasis/aspiration  2/18 CT Head/C-spine >> No acute finding in the head or cervical spine  2/18 XR Pelvis >> Negative  2/18 CT A/P without contrast >> mild bilateral posterior basilar atelectasis/infiltrates noted, focal wall thickening involving the proximal sigmoid colon with minimal surrounding inflammatory changes (acute diverticulitis vs. sequela of previous inflammation)  2/18 Renal 3/18 >>  Micro Data:  2/18 COVID/Influenza >> negative 2/18 BCx >> 2/18 UCx >>  Antimicrobials:  Cefepime 2/18 >> Vanc 2/18 >>  Interim History / Subjective:    Objective   Blood pressure (!) 118/97, pulse (!) 104, temperature 98.3 F (36.8 C), resp. rate (!) 21, height 5\' 5"  (1.651 m), weight 102.1 kg, SpO2 91 %.        Intake/Output Summary (Last 24 hours) at 02/04/2021 1039 Last data filed at 02/04/2021 0954 Gross per 24 hour  Intake --  Output 760 ml  Net -760 ml   Filed Weights   02/04/21 0615  Weight: 102.1 kg   Examination: General: WDWN adult male, drowsy/snoring, in NAD. HEENT: Anicteric sclera, PERRL,  EOMI. Dry mucous membranes with scant yellow crusting around mouth. NGT in place. Neuro: Drowsy, but wakes fairly easily to verbal/tactile stimuli. Intermittently agitated by what appears  to be back pain. Answering questions appropriately. Able to follow commands. Moves all 4 extremities spontaneously. CV: RRR, no m/g/r. PULM: Breathing even and unlabored on RA. Snoring. Lung fields decreased at bilateral bases. GI: Obese, moderately distended/compressible, no significant TTP anteriorly, TTP over bilateral flanks. Hypoactive bowel sounds. Extremities: No significant LE edema noted. Skin: Warm/dry, no rashes noted. GU: Foley catheter in place without significant bleeding/concern for trauma.  Resolved Hospital Problem list     Assessment & Plan:  Acute renal failure Metabolic acidosis Baseline Cr 7.0-2.6, presented to [facility] ED with Cr 3.46; exact etiology of ARF unknown at present. Rhabdomyolysis remains on differential. - Trend BMP - F/u repeat VBG - Obtain Renal US to r/o obstructive causes of ARF - F/u CK - Volume resuscitation  Acute metabolic encephalopathy Etiology unknown, multifactorial in the setting of ARF, sepsis, ?substance abuse. UDS negative, EtOH positive 82. - Close monitoring in ICU - F/u osmolar gap labs - Monitor for signs of alcohol withdrawal  Possible sepsis of unknown origin Multifactorial with concern for aspiration pneumonia vs. diverticulitis with flare vs. urologic source. Lactic acid 8.2 on admission. CXR with basilar infiltrates c/f atelectasis vs. Aspiration PNA. Empiric Cefepime/Vanc x 1 given in ED. - Trend lactate - F/u BCx/UCx - F/u PCT - Continue Cefepime/Vanc - Repeat CXR in AM  History of diverticulitis Abdominal distention Patient with history of diverticulitis, most significant flare with perf in 2012. CT A/P obtained on admission showed focal bowel wall thickening of the sigmoid colon with some inflammation, difficult to determine if this is acute or sequela of chronic inflammation.  - NGT to ILWS, monitor output - Zofran PRN for nausea  Hematuria New onset of hematuria on admission, likely secondary to Foley placement;  patient states not present prior to admission at home, however would continue to monitor given renal failure, flank pain and ?sepsis. - Monitor clinically - Consider Nephro/Uro consultations\  Mild acute transaminitis Query if this is related to shock, substance abuse related, medication related. No known history of opioid use, but did respond to Narcan in the field. Some known EtOH use. EtOH on arrival 55. APAP level undetectable. - Monitor LFTS, repeat in AM  OSA History of OSA on CPAP, per patient's girlfriend he is intermittently noncompliant. Did NOT wear CPAP the night prior to admission. - Continue to monitor respiratory status - CPAP QHS  Best practice (evaluated daily)  Diet: NPO Pain/Anxiety/Delirium protocol (if indicated): Per protocol VAP protocol (if indicated): N/A DVT prophylaxis: SQH GI prophylaxis: N/A Glucose control: N/A Mobility: Bedrest, as tolerated once more clinically stable Disposition: ICU  Goals of Care:  Last date of multidisciplinary goals of care discussion: Pending Family and staff present:  Summary of discussion:  Follow up goals of care discussion due: 2/25 Code Status: Full  Labs   CBC: Recent Labs  Lab 02/04/21 0617 02/04/21 0630 02/04/21 0638 02/04/21 0924 02/04/21 0928  WBC 15.0*  --   --   --   --   NEUTROABS 11.7*  --   --   --   --   HGB 17.0 18.0* 17.3* 18.0* 18.0*  HCT 54.3* 53.0* 51.0 53.0* 53.0*  MCV 105.6*  --   --   --   --   PLT 207  --   --   --   --  Basic Metabolic Panel: Recent Labs  Lab 02/04/21 0617 02/04/21 0630 02/04/21 0638 02/04/21 0924 02/04/21 0928  NA 140 140 141 142 142  K 5.2* 5.4* 4.9 6.2* 6.5*  CL 103  --   --   --  110  CO2 18*  --   --   --   --   GLUCOSE 131*  --   --   --  87  BUN 18  --   --   --  29*  CREATININE 3.46*  --   --   --  3.30*  CALCIUM 9.0  --   --   --   --    GFR: Estimated Creatinine Clearance: 31.1 mL/min (A) (by C-G formula based on SCr of 3.3 mg/dL (H)). Recent  Labs  Lab 02/04/21 0617 02/04/21 0915  WBC 15.0*  --   LATICACIDVEN 8.2* 6.3*   Liver Function Tests: Recent Labs  Lab 02/04/21 0617  AST 143*  ALT 79*  ALKPHOS 91  BILITOT 0.8  PROT 7.7  ALBUMIN 4.0   No results for input(s): LIPASE, AMYLASE in the last 168 hours. Recent Labs  Lab 02/04/21 0617  AMMONIA 42*   ABG    Component Value Date/Time   PHART 7.184 (LL) 02/04/2021 0638   PCO2ART 46.1 02/04/2021 0638   PO2ART 76 (L) 02/04/2021 0638   HCO3 21.3 02/04/2021 0924   TCO2 22 02/04/2021 0928   ACIDBASEDEF 8.0 (H) 02/04/2021 0924   O2SAT 45.0 02/04/2021 0924     Coagulation Profile: Recent Labs  Lab 02/04/21 0724  INR 1.2   Cardiac Enzymes: No results for input(s): CKTOTAL, CKMB, CKMBINDEX, TROPONINI in the last 168 hours.  HbA1C: Hgb A1c MFr Bld  Date/Time Value Ref Range Status  11/07/2007 04:10 AM   Final   4.9 (NOTE)   The ADA recommends the following therapeutic goals for glycemic   control related to Hgb A1C measurement:   Goal of Therapy:   < 7.0% Hgb A1C   Action Suggested:  > 8.0% Hgb A1C   Ref:  Diabetes Care, 22, Suppl. 1, 1999    CBG: No results for input(s): GLUCAP in the last 168 hours.  Review of Systems:   Systems reviewed and negative unless otherwise noted in above HPI.  Past Medical History:  He,  has a past medical history of Diverticulitis of intestine with abscess, Heart murmur, Hypertension, and OSA on CPAP.   Surgical History:   Past Surgical History:  Procedure Laterality Date  . ACHILLES TENDON REPAIR  06/2010   left; S/P tendon rupture     Social History:   reports that he has been smoking cigarettes. He has been smoking about 0.00 packs per day for the past 18.00 years. He has never used smokeless tobacco. He reports current alcohol use. He reports that he does not use drugs.   Family History:  His family history includes Diverticulitis in his maternal grandmother.   Allergies Allergies  Allergen Reactions  .  Lisinopril Swelling     Home Medications  Prior to Admission medications   Medication Sig Start Date End Date Taking? Authorizing Provider  sildenafil (REVATIO) 20 MG tablet Take 20 mg by mouth daily as needed (ed). 08/18/20  Yes [provider]  terbinafine (LAMISIL) 250 MG tablet Take 1 tablet (250 mg total) by mouth daily. 12/28/20  Yes Hyatt, Max T, DPM  valsartan-hydrochlorothiazide (DIOVAN-HCT) 160-12.5 MG tablet Take 1 tablet by mouth daily. 01/26/19  Yes [provider]  Critical care time: 65 minutes   Tim LairStephanie M Ronee Ranganathan, [place] JerseyPA-C [facility] 02/04/21 10:48 AM  Please see [email or web address] for pager details.

## 2021-02-04 NOTE — Medical Student Note (Signed)
[facility]-EMERGENCY DEPT Provider Student Note For educational purposes for Medical, PA and NP students only and not part of the legal medical record.   CSN: [id number] Arrival date & time: 02/04/21  0607      History   Chief Complaint Chief Complaint  Patient presents with  . Altered Mental Status    HPI Shane Guzman is a 45 y.o. male with PMH of HTN, OSA on CPAP and diverticulitis with history of abscess and perforation in 2012 who presents with altered mental status.   Level 5 Caveat.  History obtained from significant other at bedside, EMS and chart review. Per EMS they were called due to unresponsiveness. When they arrived, significant other was performing chest compressions. Patient had snoring respirations. He was given 0.4mg  Narcan with mild improvement and began screaming, but not answering questions appropriately. With EMS, HR 120, O2 83% on NRB, BP 86/52.   Significant other states routine night. He came home, ate dinner and went to bed around 10pm. She states he got up at some point in the evening and went to the other room where she eventually found him when his alarm went off this AM. She denies any history of drug use. States that he drank a small bottle of alcohol last night, unknown type, but denies history of alcohol abuse. He has not recently been ill.   On exam, patient only yells out in pain in his left hip. He is able to corroborate some of the history given by significant other. He states that he drinks half a pint of alcohol and unable to elaborate on how often he drinks. He denies every having withdrawal from alcohol or seizures.  Past Medical History:  Diagnosis Date  . Diverticulitis of intestine with abscess   . Heart murmur   . Hypertension   . OSA on CPAP     Patient Active Problem List   Diagnosis Date Noted  . Diverticulitis 06/13/2015  . Sigmoid diverticulitis 12/06/2011  . Diverticulitis of colon with perforation 11/22/2011  . TOBACCO USER  09/04/2009  . DIVERTICULAR DISEASE 11/18/2007  . MURMUR 11/18/2007    Past Surgical History:  Procedure Laterality Date  . ACHILLES TENDON REPAIR  06/2010   left; S/P tendon rupture     Home Medications    Prior to Admission medications   Medication Sig Start Date End Date Taking? Authorizing Provider  sildenafil (REVATIO) 20 MG tablet Take 20 mg by mouth daily as needed (ed). 08/18/20  Yes [provider]  terbinafine (LAMISIL) 250 MG tablet Take 1 tablet (250 mg total) by mouth daily. 12/28/20  Yes Hyatt, Max T, DPM  valsartan-hydrochlorothiazide (DIOVAN-HCT) 160-12.5 MG tablet Take 1 tablet by mouth daily. 01/26/19  Yes [provider]    Family History Family History  Problem Relation Age of Onset  . Diverticulitis Maternal Grandmother     Social History Social History   Tobacco Use  . Smoking status: Current Every Day Smoker    Packs/day: 0.00    Years: 18.00    Pack years: 0.00    Types: Cigarettes  . Smokeless tobacco: Never Used  Substance Use Topics  . Alcohol use: Yes  . Drug use: No     Allergies   Lisinopril   Review of Systems Review of Systems  Unable to perform ROS: Mental status change   Physical Exam Updated Vital Signs BP 118/82   Pulse (!) 107   Temp (!) 96.2 F (35.7 C) (Oral)   Resp Marland Kitchen)  30   Ht  (1.651 m)   Wt 102.1 kg   SpO2 97%   BMI 37.44 kg/m   Physical Exam Constitutional:      Appearance: He is toxic-appearing.  HENT:     Head: Normocephalic and atraumatic.     Nose: Nose normal.     Mouth/Throat:     Mouth: Mucous membranes are dry.     Pharynx: Oropharynx is clear. No posterior oropharyngeal erythema.  Eyes:     General: No scleral icterus.    Conjunctiva/sclera: Conjunctivae normal.     Pupils: Pupils are equal, round, and reactive to light.  Cardiovascular:     Rate and Rhythm: Regular rhythm. Tachycardia present.     Pulses: Normal pulses.     Heart sounds: No murmur heard.   Pulmonary:      Effort: Tachypnea and prolonged expiration present.     Breath sounds: Rhonchi present.  Abdominal:     General: Abdomen is protuberant. There is no distension.     Tenderness: There is no abdominal tenderness. There is no guarding or rebound.  Genitourinary:    Penis: Normal.   Musculoskeletal:        General: Tenderness present.     Cervical back: Neck supple.     Left hip: Tenderness and bony tenderness present.     Comments: Left hip and buttock tenderness   Skin:    General: Skin is cool and dry.     Capillary Refill: Capillary refill takes 2 to 3 seconds.     Findings: No bruising, erythema or petechiae.  Neurological:     General: No focal deficit present.     Mental Status: He is lethargic.     GCS: GCS eye subscore is 3. GCS verbal subscore is 5. GCS motor subscore is 6.  Psychiatric:        Speech: Speech normal.    ED Treatments / Results  Labs (all labs ordered are listed, but only abnormal results are displayed) Labs Reviewed  CBC WITH DIFFERENTIAL/PLATELET - Abnormal; Notable for the following components:      Result Value   WBC 15.0 (*)    HCT 54.3 (*)    MCV 105.6 (*)    Neutro Abs 11.7 (*)    Monocytes Absolute 1.8 (*)    Abs Immature Granulocytes 0.15 (*)    All other components within normal limits  COMPREHENSIVE METABOLIC PANEL - Abnormal; Notable for the following components:   Potassium 5.2 (*)    CO2 18 (*)    Glucose, Bld 131 (*)    Creatinine, Ser 3.46 (*)    AST 143 (*)    ALT 79 (*)    GFR, Estimated 21 (*)    Anion gap 19 (*)    All other components within normal limits  LACTIC ACID, PLASMA - Abnormal; Notable for the following components:   Lactic Acid, Venous 8.2 (*)    All other components within normal limits  ETHANOL - Abnormal; Notable for the following components:   Alcohol, Ethyl (B) 84 (*)    All other components within normal limits  AMMONIA - Abnormal; Notable for the following components:   Ammonia 42 (*)    All other  components within normal limits  SALICYLATE LEVEL - Abnormal; Notable for the following components:   Salicylate Lvl <7.0 (*)    All other components within normal limits  ACETAMINOPHEN LEVEL - Abnormal; Notable for the following components:   Acetaminophen (Tylenol), Serum <  10 (*)    All other components within normal limits  I-STAT ARTERIAL BLOOD GAS, ED - Abnormal; Notable for the following components:   pH, Arterial 7.184 (*)    pO2, Arterial 76 (*)    Bicarbonate 17.7 (*)    TCO2 19 (*)    Acid-base deficit 11.0 (*)    Hemoglobin 17.3 (*)    All other components within normal limits  TROPONIN I (HIGH SENSITIVITY) - Abnormal; Notable for the following components:   Troponin I (High Sensitivity) 163 (*)    All other components within normal limits  URINE CULTURE  CULTURE, BLOOD (ROUTINE X 2)  CULTURE, BLOOD (ROUTINE X 2)  RESP PANEL BY RT-PCR (FLU A&B, COVID) ARPGX2  CULTURE, BLOOD (ROUTINE X 2)  CULTURE, BLOOD (ROUTINE X 2)  URINE CULTURE  BRAIN NATRIURETIC PEPTIDE  PROTIME-INR  APTT  URINALYSIS, ROUTINE W REFLEX MICROSCOPIC  BETA-HYDROXYBUTYRIC ACID  RAPID URINE DRUG SCREEN, HOSP PERFORMED  BLOOD GAS, VENOUS  I-STAT CHEM 8, ED  TROPONIN I (HIGH SENSITIVITY)    EKG  Radiology CT HEAD WO CONTRAST  Result Date: 02/04/2021 CLINICAL DATA:  Neck trauma, intoxicated or obtained EXAM: CT HEAD WITHOUT CONTRAST CT CERVICAL SPINE WITHOUT CONTRAST TECHNIQUE: Multidetector CT imaging of the head and cervical spine was performed following the standard protocol without intravenous contrast. Multiplanar CT image reconstructions of the cervical spine were also generated. COMPARISON:  None. FINDINGS: CT HEAD FINDINGS Brain: No evidence of acute infarction, hemorrhage, hydrocephalus, extra-axial collection or mass lesion/mass effect. Vascular: No focal hyperdense vessel or unexpected calcification. Skull: Normal. Negative for fracture or focal lesion. Sinuses/Orbits: No acute finding. CT  CERVICAL SPINE FINDINGS Alignment: Normal. Skull base and vertebrae: No acute fracture. No primary bone lesion or focal pathologic process. Soft tissues and spinal canal: No prevertebral fluid or swelling. No visible canal hematoma. Disc levels:  Mid cervical degenerative disc narrowing and ridging. Upper chest: Atelectasis seen in the right upper lobe. IMPRESSION: No acute finding in the head or cervical spine. Electronically Signed   By: Marnee Spring M.D.   On: 02/04/2021 07:13   CT CERVICAL SPINE WO CONTRAST  Result Date: 02/04/2021 CLINICAL DATA:  Neck trauma, intoxicated or obtained EXAM: CT HEAD WITHOUT CONTRAST CT CERVICAL SPINE WITHOUT CONTRAST TECHNIQUE: Multidetector CT imaging of the head and cervical spine was performed following the standard protocol without intravenous contrast. Multiplanar CT image reconstructions of the cervical spine were also generated. COMPARISON:  None. FINDINGS: CT HEAD FINDINGS Brain: No evidence of acute infarction, hemorrhage, hydrocephalus, extra-axial collection or mass lesion/mass effect. Vascular: No focal hyperdense vessel or unexpected calcification. Skull: Normal. Negative for fracture or focal lesion. Sinuses/Orbits: No acute finding. CT CERVICAL SPINE FINDINGS Alignment: Normal. Skull base and vertebrae: No acute fracture. No primary bone lesion or focal pathologic process. Soft tissues and spinal canal: No prevertebral fluid or swelling. No visible canal hematoma. Disc levels:  Mid cervical degenerative disc narrowing and ridging. Upper chest: Atelectasis seen in the right upper lobe. IMPRESSION: No acute finding in the head or cervical spine. Electronically Signed   By: Marnee Spring M.D.   On: 02/04/2021 07:13   DG Pelvis Portable  Result Date: 02/04/2021 CLINICAL DATA:  Altered mental status.  Left hip pain. EXAM: PORTABLE PELVIS 1-2 VIEWS COMPARISON:  None. FINDINGS: There is no evidence of pelvic fracture or diastasis. No pelvic bone lesions are  seen. IMPRESSION: Negative. Electronically Signed   By: Marnee Spring M.D.   On: 02/04/2021 07:33   DG  Chest Port 1 View  Result Date: 02/04/2021 CLINICAL DATA:  Unresponsive EXAM: PORTABLE CHEST 1 VIEW COMPARISON:  08/06/2011 FINDINGS: Borderline heart size accentuated by technique. Indistinct perihilar density. No visible effusion or pneumothorax. IMPRESSION: Perihilar opacity with history favoring atelectasis or aspiration. Electronically Signed   By: Marnee Spring M.D.   On: 02/04/2021 06:44   Procedures Procedures (including critical care time)  Medications Ordered in ED Medications  lactated ringers infusion (has no administration in time range)  lactated ringers bolus 1,000 mL (1,000 mLs Intravenous New Bag/Given 02/04/21 4481)    And  lactated ringers bolus 1,000 mL (1,000 mLs Intravenous New Bag/Given 02/04/21 0817)  ceFEPIme (MAXIPIME) 2 g in sodium chloride 0.9 % 100 mL IVPB (2 g Intravenous New Bag/Given 02/04/21 8563)  metroNIDAZOLE (FLAGYL) IVPB 500 mg (500 mg Intravenous New Bag/Given 02/04/21 0812)  vancomycin (VANCOREADY) IVPB 2000 mg/400 mL (has no administration in time range)  vancomycin variable dose per unstable renal function (pharmacist dosing) (has no administration in time range)  ceFEPIme (MAXIPIME) 2 g in sodium chloride 0.9 % 100 mL IVPB (has no administration in time range)  LORazepam (ATIVAN) injection 1 mg (1 mg Intravenous Given 02/04/21 0802)   Initial Impression / Assessment and Plan / ED Course  I have reviewed the triage vital signs and the nursing notes.  Pertinent labs & imaging results that were available during my care of the patient were reviewed by me and considered in my medical decision making (see chart for details).  Terrall Bley is a 23yoM with PMH and HPI as listed above. Differential diagnosis is broad initially including seizure, aspiration pneumonia 2/2 alcohol, CVA, overdose, sepsis 2/2 unknown source at this time.   Unlikely CVA -  head CT is negative. As is he waking up he is more oriented, there are no focal neurological deficits.  Troponin elevated likely due to reported compressions.   Multiple lab abnormalities showing metabolic acidosis with anion gap, WBC 15, AKI with Cr 3.46, K 5.2 without EKG changes, LFTs elevated with AST > ALT likely alcohol related. EtOH 84, Ammonia 42, lactic 8.2. Acetaminophen and salicylates negative.   Vitals reflective of infection with tachycardia, hypotension and hypothermia on arrival. Initially resuscitated with 3L IVF per 30mg /kg sepsis protocol.  Final Clinical Impressions(s) / ED Diagnoses   Final diagnoses:  Altered mental status, unspecified altered mental status type    New Prescriptions New Prescriptions   No medications on file

## 2021-02-05 ENCOUNTER — Inpatient Hospital Stay (HOSPITAL_COMMUNITY): Payer: BC Managed Care – PPO

## 2021-02-05 DIAGNOSIS — G4733 Obstructive sleep apnea (adult) (pediatric): Secondary | ICD-10-CM

## 2021-02-05 DIAGNOSIS — M6282 Rhabdomyolysis: Secondary | ICD-10-CM

## 2021-02-05 LAB — COMPREHENSIVE METABOLIC PANEL
ALT: 526 U/L — ABNORMAL HIGH (ref 0–44)
AST: 1517 U/L — ABNORMAL HIGH (ref 15–41)
Albumin: 2.8 g/dL — ABNORMAL LOW (ref 3.5–5.0)
Alkaline Phosphatase: 60 U/L (ref 38–126)
Anion gap: 11 (ref 5–15)
BUN: 24 mg/dL — ABNORMAL HIGH (ref 6–20)
CO2: 31 mmol/L (ref 22–32)
Calcium: 7.8 mg/dL — ABNORMAL LOW (ref 8.9–10.3)
Chloride: 92 mmol/L — ABNORMAL LOW (ref 98–111)
Creatinine, Ser: 1.85 mg/dL — ABNORMAL HIGH (ref 0.61–1.24)
GFR, Estimated: 45 mL/min — ABNORMAL LOW (ref >60)
Glucose, Bld: 136 mg/dL — ABNORMAL HIGH (ref 70–99)
Potassium: 3.9 mmol/L (ref 3.5–5.1)
Sodium: 134 mmol/L — ABNORMAL LOW (ref 135–145)
Total Bilirubin: 1.1 mg/dL (ref 0.3–1.2)
Total Protein: 6.6 g/dL (ref 6.5–8.1)

## 2021-02-05 LAB — CBC
HCT: 48 % (ref 39.0–52.0)
Hemoglobin: 16.7 g/dL (ref 13.0–17.0)
MCH: 33.7 pg (ref 26.0–34.0)
MCHC: 34.8 g/dL (ref 30.0–36.0)
MCV: 97 fL (ref 80.0–100.0)
Platelets: 135 10*3/uL — ABNORMAL LOW (ref 150–400)
RBC: 4.95 MIL/uL (ref 4.22–5.81)
RDW: 11.7 % (ref 11.5–15.5)
WBC: 13.1 10*3/uL — ABNORMAL HIGH (ref 4.0–10.5)
nRBC: 0 % (ref 0.0–0.2)

## 2021-02-05 LAB — BASIC METABOLIC PANEL
Anion gap: 14 (ref 5–15)
BUN: 25 mg/dL — ABNORMAL HIGH (ref 6–20)
CO2: 26 mmol/L (ref 22–32)
Calcium: 7.8 mg/dL — ABNORMAL LOW (ref 8.9–10.3)
Chloride: 95 mmol/L — ABNORMAL LOW (ref 98–111)
Creatinine, Ser: 2.16 mg/dL — ABNORMAL HIGH (ref 0.61–1.24)
GFR, Estimated: 38 mL/min — ABNORMAL LOW (ref >60)
Glucose, Bld: 135 mg/dL — ABNORMAL HIGH (ref 70–99)
Potassium: 4 mmol/L (ref 3.5–5.1)
Sodium: 135 mmol/L (ref 135–145)

## 2021-02-05 LAB — PHOSPHORUS: Phosphorus: 4.8 mg/dL — ABNORMAL HIGH (ref 2.5–4.6)

## 2021-02-05 LAB — URINE CULTURE: Culture: NO GROWTH

## 2021-02-05 LAB — CK TOTAL AND CKMB (NOT AT ARMC)
CK, MB: 178.4 ng/mL — ABNORMAL HIGH (ref 0.5–5.0)
Total CK: >50000 U/L — ABNORMAL HIGH (ref 49–397)

## 2021-02-05 LAB — MAGNESIUM: Magnesium: 2.1 mg/dL (ref 1.7–2.4)

## 2021-02-05 LAB — HAPTOGLOBIN: Haptoglobin: 204 mg/dL (ref 23–355)

## 2021-02-05 LAB — GLUCOSE, CAPILLARY: Glucose-Capillary: 125 mg/dL — ABNORMAL HIGH (ref 70–99)

## 2021-02-05 LAB — LACTATE DEHYDROGENASE: LDH: 3351 U/L — ABNORMAL HIGH (ref 98–192)

## 2021-02-05 LAB — LACTIC ACID, PLASMA: Lactic Acid, Venous: 1.5 mmol/L (ref 0.5–1.9)

## 2021-02-05 MED ORDER — SODIUM CHLORIDE 0.9 % IV SOLN: INTRAVENOUS | Status: DC

## 2021-02-05 MED ORDER — SIMETHICONE 80 MG PO CHEW
160.0000 mg | CHEWABLE_TABLET | Freq: Once | ORAL | Status: AC
  Administered 2021-02-05: 160 mg via ORAL
  Filled 2021-02-05: qty 2

## 2021-02-05 MED ORDER — SODIUM CHLORIDE 0.9 % IV SOLN
INTRAVENOUS | Status: DC | PRN
  Administered 2021-02-05: 1000 mL via INTRAVENOUS

## 2021-02-05 MED ORDER — BISACODYL 10 MG RE SUPP
10.0000 mg | Freq: Every day | RECTAL | Status: DC | PRN
  Filled 2021-02-05: qty 1

## 2021-02-05 MED ORDER — AMOXICILLIN-POT CLAVULANATE 875-125 MG PO TABS
1.0000 | ORAL_TABLET | Freq: Two times a day (BID) | ORAL | Status: DC
  Administered 2021-02-05 – 2021-02-11 (×13): 1 via ORAL
  Filled 2021-02-05 (×13): qty 1

## 2021-02-05 NOTE — Progress Notes (Signed)
NAME:  MARIBEL LUIS, MRN:  [id number], DOB:  1976-10-04, LOS: 1 ADMISSION DATE:  02/04/2021, CONSULTATION DATE:  02/04/2021 REFERRING MD:  Margarita Grizzle CHIEF COMPLAINT:  AMS   Brief History:  45 year old male found unresponsive at home by girlfriend with oral secretions; EMS called. EMS arrived and administered brief CPR (patient never pulseless, but with pinpoint pupils, agonal breaths, sats 80%). Narcan given with improvement in mental status. Brought to [facility] ED for further evaluation.    Past Medical History:  HTN (well-controlled), sigmoid diverticulitis (s/p perforation 2012), OSA (on CPAP, intermittently compliant), tobacco/EtOH use  Significant Hospital Events:  2/18 Presented to [facility] ED with AMS, ARF, hypoxia, PCCM consulted for admission, Patient unable to move L lower extremity from his ankle down. No sensation in the foot besides paresthesia. Foot is notably cool to touch more-so than the right. Neurology called who are recommending MRI brain to rule out focal CVA and Echocardiogram to rule out embolic event.  Patient would tolerate MRI at that time.  ABIs obtained  2/19 ABIs unable to be calculated. ECHO LV EF 60-65%, gd 1 diastolic dysfn UOP increased. All renal and metabolic labs better.  Consults:    Procedures:    Significant Diagnostic Tests:   2/18 CXR >> Perihilar opacity with history favoring atelectasis/aspiration  2/18 CT Head/C-spine >> No acute finding in the head or cervical spine  2/18 XR Pelvis >> Negative  2/18 CT A/P without contrast >> mild bilateral posterior basilar atelectasis/infiltrates noted, focal wall thickening involving the proximal sigmoid colon with minimal surrounding inflammatory changes (acute diverticulitis vs. sequela of previous inflammation)  2/18 Renal US >>neg for hydro   Micro Data:  2/18 COVID/Influenza >> negative 2/18 BCx >> 2/18 UCx >>  Antimicrobials:  Cefepime 2/18 >>2/19 Vanc 2/18 >>2/19 augmentin 2/19>>>  Interim  History / Subjective:  Still has some discomfort of left leg and limited mobility   Objective   Blood pressure (Abnormal) 169/118, pulse 97, temperature 98.78 F (37.1 C), resp. rate 16, height 5\' 4"  (1.626 m), weight 104 kg, SpO2 93 %.        Intake/Output Summary (Last 24 hours) at 02/05/2021 1112 Last data filed at 02/05/2021 1000 Gross per 24 hour  Intake 4849.59 ml  Output 3050 ml  Net 1799.59 ml   Filed Weights   02/04/21 0615 02/04/21 1500 02/05/21 0500  Weight: 102.1 kg 104 kg 104 kg   Examination: General 45 year old male resting in bed  HENT NCAT no JVD  pulm cl dec bases Card rrr abd soft  Ext good pulses warm. Limited mobility to LLE gu cl yellow   Resolved Hospital Problem list   Hematuria resolved  Acute metabolic encephalopathy multifactorial: acidosis, substance abuse  Sepsis ->resolved  Assessment & Plan:   Acute renal failure 2/2 rhabdomyolysis and probably element of volume contraction  Metabolic acidosis Baseline Cr 54, presented to [facility] ED with Cr 3.46; - -renal fxn & acidosis improving. Excellent UOP drinking. Seen by nephro who changed to crystalloid from bicarb.  Plan Cont IVFs (changed to crystalloid by nephro) Keep foley in place  Encourage PO fluids Cont to trend chemistry  Acute rhabdomyolysis w/ what appears to be muscular injury to LLE w/ initial marked CK and LDH elevation. Has good pulses but mobility worse Plan Checking repeat LDH & total CKs-->if both rising we need to consider repeat imaging  Cont IV hydration   Sepsis ->SIRS resolved.  Aspiration PNA Multifactorial with concern for aspiration pneumonia vs. diverticulitis  with flare vs. urologic source. Lactic acid 8.2 on admission.  pcxr w/ bibasilar airspace disease Plan Dc vanc Change abx to augmentin and treat total 7d Repeat cbc today   History of diverticulitis Abdominal distention: Patient with history of diverticulitis, most significant flare with perf in 2012.  CT A/P obtained on admission showed focal bowel wall thickening of the sigmoid colon with some inflammation, difficult to determine if this is acute or sequela of chronic inflammation.  -tolerating diet  Plan Dc ngt Adv diet    Mild acute transaminitis Query if this is related to shock, substance abuse related, medication related. No known history of opioid use, but did respond to Narcan in the field. Some known EtOH use. EtOH on arrival 102. APAP level undetectable. Plan Repeat LFT   OSA History of OSA on CPAP, per patient's girlfriend he is intermittently noncompliant. Did NOT wear CPAP the night prior to admission. Plan CPAP at [facility]  Best practice (evaluated daily)  Diet: NPO-->regular  Pain/Anxiety/Delirium protocol (if indicated): Per protocol VAP protocol (if indicated): N/A DVT prophylaxis: SQH GI prophylaxis: N/A Glucose control: N/A Mobility: Bedrest, as tolerated once more clinically stable Disposition: ICU  Goals of Care:  Last date of multidisciplinary goals of care discussion: Pending Family and staff present:  Summary of discussion:  Follow up goals of care discussion due: 2/25 Code Status: Full   Critical care time: NA    Simonne Martinet ACNP-BC [facility] Pulmonary/Critical Care Pager # [phone] OR # [phone] if no answer

## 2021-02-05 NOTE — Progress Notes (Incomplete)
Patient complained of lower abdomen pain. Patient rates the pain 10 on a 0-10 scale. Bowel sound audible in all quadrants. Abdomen is firm and distended. Last bowel movement was on the 02/04/2020 and patient . Elink called and notified.

## 2021-02-05 NOTE — Progress Notes (Signed)
eLink Physician-Brief Progress Note Patient Name: Shane Guzman DOB: 1976/01/07 MRN: [id number]   Date of Service  02/05/2021  HPI/Events of Note  Patient c/o 10/10 LLQ pain. Given Miralax for constipation earlier today. Last BM 2/17.  eICU Interventions  Plan: 1. Simethicone 160 mg PO X 1 now.  2. Dulcolax Suppsitory 10 mg PR X 1 now.      Intervention Category Major Interventions: Other:  [name] 02/05/2021, 11:36 PM

## 2021-02-05 NOTE — Progress Notes (Signed)
Patient wishes medical team update father and father will be responsible for updating family.

## 2021-02-05 NOTE — Progress Notes (Addendum)
Patient ID: Shane Guzman, male   DOB: 03/29/1976, 45 y.o.   MRN: [id number] [facility] Progress Note   Assessment/ Plan:   1.  Acute kidney injury: Likely from a combination of volume contraction and pigment nephropathy/rhabdomyolysis. Nonoliguric overnight/increased urine output with sodium bicarbonate drip-improving electrolyte abnormalities/renal function noted on labs this morning.  Will transition to normal saline today and continue to follow urine output/labs.  No indication at this time for diuretic use. 2.  Rhabdomyolysis: Evidenced by physical exam-likely area affected is the left hip;  rising AST/ALT noted likely muscular in origin.  With abdominal distention and continued pain/numbness of left lower extremity, unclear if needs repeat CT scan of the abdomen and pelvis. 3.  Hyperkalemia: Secondary to acute kidney injury and rhabdomyolysis, corrected with Lokelma and bicarbonate drip. 4.  Anion gap metabolic acidosis: Secondary to acute kidney injury and lactic acidosis- corrected with sodium bicarbonate drip/volume expansion. 5.  Hypertension:  ARB/diuretics on hold in the setting of acute kidney injury and efforts at volume expansion.  Started on low-dose metoprolol yesterday for blood pressure control.  Subjective:   Complains of left leg pain/numbness overnight.  He is more awake and alert today.   Objective:   BP (!) 142/109   Pulse 95   Temp 98.96 F (37.2 C)   Resp 18   Ht 5\' 4"  (1.626 m)   Wt 104 kg   SpO2 96%   BMI 39.36 kg/m   Intake/Output Summary (Last 24 hours) at 02/05/2021 0911 Last data filed at 02/05/2021 0644 Gross per 24 hour  Intake 3683.37 ml  Output 2810 ml  Net 873.37 ml   Weight change: 1.941 kg  Physical Exam: Gen: Appears comfortable resting in bed, girlfriend at bedside CVS: Pulse regular rhythm, normal rate, S1-S2 normal Resp: Clear to auscultation, no rales/rhonchi.  NGT in situ. Abd: Firm, moderately distended, nontender to  exam.  Bowel sounds distant Ext: No lower extremity edema.  Focal swelling/firm area over left lateral hip/thigh  Imaging: CT ABDOMEN PELVIS WO CONTRAST  Result Date: 02/04/2021 CLINICAL DATA:  Acute generalized abdominal pain. EXAM: CT ABDOMEN AND PELVIS WITHOUT CONTRAST TECHNIQUE: Multidetector CT imaging of the abdomen and pelvis was performed following the standard protocol without IV contrast. COMPARISON:  June 12, 2015. FINDINGS: Lower chest: Mild bilateral posterior basilar subsegmental atelectasis or infiltrates are noted. Hepatobiliary: No focal liver abnormality is seen. No gallstones, gallbladder wall thickening, or biliary dilatation. Pancreas: Unremarkable. No pancreatic ductal dilatation or surrounding inflammatory changes. Spleen: Normal in size without focal abnormality. Adrenals/Urinary Tract: Adrenal glands and kidneys appear normal. No hydronephrosis or renal obstruction is noted. No renal or ureteral calculi are noted. Urinary bladder is decompressed secondary to Foley catheter. Stomach/Bowel: Stomach is within normal limits. Appendix appears normal. No evidence of bowel obstruction. Sigmoid diverticulosis is noted. There is focal wall thickening involving the proximal sigmoid colon with minimal surrounding inflammatory changes; is uncertain if this represents acute diverticulitis or sequela of previous such inflammation. Vascular/Lymphatic: No significant vascular findings are present. No enlarged abdominal or pelvic lymph nodes. Reproductive: Prostate is unremarkable. Other: No abdominal wall hernia or abnormality. No abdominopelvic ascites. Musculoskeletal: No acute or significant osseous findings. IMPRESSION: 1. Mild bilateral posterior basilar subsegmental atelectasis or infiltrates are noted. 2. Focal wall thickening is seen involving the proximal sigmoid colon with minimal surrounding inflammatory changes; it is uncertain if this represents acute diverticulitis or sequela of previous  such inflammation. Electronically Signed   By: June 14, 2015.D.  On: 02/04/2021 09:28   CT HEAD WO CONTRAST  Result Date: 02/04/2021 CLINICAL DATA:  Neck trauma, intoxicated or obtained EXAM: CT HEAD WITHOUT CONTRAST CT CERVICAL SPINE WITHOUT CONTRAST TECHNIQUE: Multidetector CT imaging of the head and cervical spine was performed following the standard protocol without intravenous contrast. Multiplanar CT image reconstructions of the cervical spine were also generated. COMPARISON:  None. FINDINGS: CT HEAD FINDINGS Brain: No evidence of acute infarction, hemorrhage, hydrocephalus, extra-axial collection or mass lesion/mass effect. Vascular: No focal hyperdense vessel or unexpected calcification. Skull: Normal. Negative for fracture or focal lesion. Sinuses/Orbits: No acute finding. CT CERVICAL SPINE FINDINGS Alignment: Normal. Skull base and vertebrae: No acute fracture. No primary bone lesion or focal pathologic process. Soft tissues and spinal canal: No prevertebral fluid or swelling. No visible canal hematoma. Disc levels:  Mid cervical degenerative disc narrowing and ridging. Upper chest: Atelectasis seen in the right upper lobe. IMPRESSION: No acute finding in the head or cervical spine. Electronically Signed   By: Marnee Spring M.D.   On: 02/04/2021 07:13   CT CERVICAL SPINE WO CONTRAST  Result Date: 02/04/2021 CLINICAL DATA:  Neck trauma, intoxicated or obtained EXAM: CT HEAD WITHOUT CONTRAST CT CERVICAL SPINE WITHOUT CONTRAST TECHNIQUE: Multidetector CT imaging of the head and cervical spine was performed following the standard protocol without intravenous contrast. Multiplanar CT image reconstructions of the cervical spine were also generated. COMPARISON:  None. FINDINGS: CT HEAD FINDINGS Brain: No evidence of acute infarction, hemorrhage, hydrocephalus, extra-axial collection or mass lesion/mass effect. Vascular: No focal hyperdense vessel or unexpected calcification. Skull: Normal. Negative  for fracture or focal lesion. Sinuses/Orbits: No acute finding. CT CERVICAL SPINE FINDINGS Alignment: Normal. Skull base and vertebrae: No acute fracture. No primary bone lesion or focal pathologic process. Soft tissues and spinal canal: No prevertebral fluid or swelling. No visible canal hematoma. Disc levels:  Mid cervical degenerative disc narrowing and ridging. Upper chest: Atelectasis seen in the right upper lobe. IMPRESSION: No acute finding in the head or cervical spine. Electronically Signed   By: Marnee Spring M.D.   On: 02/04/2021 07:13   US RENAL  Result Date: 02/04/2021 CLINICAL DATA:  Acute renal failure. EXAM: RENAL / URINARY TRACT ULTRASOUND COMPLETE COMPARISON:  CT abdomen and pelvis today. FINDINGS: Right Kidney: Renal measurements: 11.6 x 5.7 x 5.3 cm = volume: 183.5 mL. Echogenicity within normal limits. No mass or hydronephrosis visualized. Left Kidney: Renal measurements: 12.1 x 6.5 x 6.0 cm = volume: 247 mL. Echogenicity within normal limits. No mass or hydronephrosis visualized. Bladder: Not visualized. Note is made that the urinary bladder is completely decompressed with a Foley catheter in place on the comparison CT. Other: None. IMPRESSION: Negative for hydronephrosis.  Normal appearing kidneys. Electronically Signed   By: Drusilla Kanner M.D.   On: 02/04/2021 13:11   DG Pelvis Portable  Result Date: 02/04/2021 CLINICAL DATA:  Altered mental status.  Left hip pain. EXAM: PORTABLE PELVIS 1-2 VIEWS COMPARISON:  None. FINDINGS: There is no evidence of pelvic fracture or diastasis. No pelvic bone lesions are seen. IMPRESSION: Negative. Electronically Signed   By: Marnee Spring M.D.   On: 02/04/2021 07:33   DG Chest Port 1 View  Result Date: 02/05/2021 CLINICAL DATA:  Concern for aspiration pneumonia EXAM: PORTABLE CHEST 1 VIEW COMPARISON:  Yesterday FINDINGS: Cardiomegaly. Enteric tube with tip at the stomach. Negative aortic and hilar contours for technique. Low volume chest  with interstitial crowding and streaky density at the bases where there is opacity and  volume loss on CT of the abdomen from yesterday. No edema . No effusion or pneumothorax. IMPRESSION: Stable lower lobe infiltrates as seen by chest CT yesterday. Electronically Signed   By: Marnee SpringJonathon  Watts M.D.   On: 02/05/2021 07:25   DG Chest Port 1 View  Result Date: 02/04/2021 CLINICAL DATA:  Unresponsive EXAM: PORTABLE CHEST 1 VIEW COMPARISON:  08/06/2011 FINDINGS: Borderline heart size accentuated by technique. Indistinct perihilar density. No visible effusion or pneumothorax. IMPRESSION: Perihilar opacity with history favoring atelectasis or aspiration. Electronically Signed   By: Marnee SpringJonathon  Watts M.D.   On: 02/04/2021 06:44   DG Abd Portable 1 View  Result Date: 02/04/2021 CLINICAL DATA:  NG tube placement. EXAM: PORTABLE ABDOMEN - 1 VIEW COMPARISON:  11/07/2007. FINDINGS: 1018 hours. NG tube tip is in the mid stomach. Proximal side port of the NG tube is just above the GE junction. Visualized upper abdomen demonstrates nonspecific gas pattern. IMPRESSION: NG tube tip is in the mid stomach although proximal side port is above the GE junction. Tube could be advanced approximately 5-6 cm for placement of the side port below the GE junction. Electronically Signed   By: Kennith CenterEric  Mansell M.D.   On: 02/04/2021 10:30   VAS US ABI WITH/WO TBI  Result Date: 02/04/2021 LOWER EXTREMITY DOPPLER STUDY Indications: Rest pain.  Limitations: Today's exam was limited due to patient positioning, involuntary              patient movement, bandages , patient unable to lie flat and IV,              ambient room "noise". Comparison Study: No prior studies. Performing Technologist: Olen Cordialollins, Greg RVT  Examination Guidelines: A complete evaluation includes at minimum, Doppler waveform signals and systolic blood pressure reading at the level of bilateral brachial, anterior tibial, and posterior tibial arteries, when vessel segments are  accessible. Bilateral testing is considered an integral part of a complete examination. Photoelectric Plethysmograph (PPG) waveforms and toe systolic pressure readings are included as required and additional duplex testing as needed. Limited examinations for reoccurring indications may be performed as noted.  ABI Findings: +--------+------------------+-----+---------+----------------------------------+ Right   Rt Pressure (mmHg)IndexWaveform Comment                            +--------+------------------+-----+---------+----------------------------------+ Brachial                                Unable to obtain waveform and                                              pressure due to IV and bandages    +--------+------------------+-----+---------+----------------------------------+ PTA     138               1.55 triphasic                                   +--------+------------------+-----+---------+----------------------------------+ DP      160               1.80 triphasic                                   +--------+------------------+-----+---------+----------------------------------+ +--------+------------------+-----+---------+-------+  Left    Lt Pressure (mmHg)IndexWaveform Comment +--------+------------------+-----+---------+-------+ Brachial89                     triphasic        +--------+------------------+-----+---------+-------+ PTA     167               1.88 triphasic        +--------+------------------+-----+---------+-------+ DP      152               1.71 triphasic        +--------+------------------+-----+---------+-------+ +-------+-----------+-----------+------------+------------+ ABI/TBIToday's ABIToday's TBIPrevious ABIPrevious TBI +-------+-----------+-----------+------------+------------+ Right  1.8                                            +-------+-----------+-----------+------------+------------+ Left   1.88                                            +-------+-----------+-----------+------------+------------+  Summary: Right: Resting right ankle-brachial index indicates noncompressible right lower extremity arteries. Values are likely falsely elevated. Captured waveforms do not represent what was heard during the test. Triphasic waveforms were noted in the posterior tibial and dorsalis pedis arteries. Unable to obtain TBI due to patient constant movement. Left: Resting left ankle-brachial index indicates noncompressible left lower extremity arteries. Values are likely falsely elevated. Captured waveforms do not represent what was heard during the test. Triphasic waveforms were noted in the posterior tibial and dorsalis pedis arteries. Unable to obtain TBI due to patient constant movement.  *See table(s) above for measurements and observations.     Preliminary    ECHOCARDIOGRAM COMPLETE  Result Date: 02/04/2021    ECHOCARDIOGRAM REPORT   Patient Name:   Shane Guzman Date of Exam: 02/04/2021 Medical Rec #:  [id number]       Height:       65.0 in Accession #:    [id number]      Weight:       225.0 lb Date of Birth:  [birth year]       BSA:          2.080 m Patient Age:    44 years        BP:           139/121 mmHg Patient Gender: M               HR:           101 bpm. Exam Location:  Inpatient Procedure: 2D Echo, Cardiac Doppler and Color Doppler Indications:    Murmur  History:        Patient has no prior history of Echocardiogram examinations.                 Risk Factors:Sleep Apnea and Hypertension.  Sonographer:    Ross Ludwig RDCS (AE) Referring Phys: [id number] [facility] M REESE  Sonographer Comments: Suboptimal subcostal window. IMPRESSIONS  1. Left ventricular ejection fraction, by estimation, is 60 to 65%. The left ventricle has normal function. The left ventricle has no regional wall motion abnormalities. There is moderate left ventricular hypertrophy. Left ventricular diastolic parameters are consistent with Grade I  diastolic dysfunction (impaired relaxation).  2. Right ventricular systolic function is normal. The right ventricular size is normal.  3.  The mitral valve is normal in structure. No evidence of mitral valve regurgitation. No evidence of mitral stenosis.  4. The aortic valve is tricuspid. There is moderate calcification of the aortic valve. There is moderate thickening of the aortic valve. Aortic valve regurgitation is not visualized. Mild to moderate aortic valve sclerosis/calcification is present, without any evidence of aortic stenosis.  5. The inferior vena cava is normal in size with greater than 50% respiratory variability, suggesting right atrial pressure of 3 mmHg. FINDINGS  Left Ventricle: Left ventricular ejection fraction, by estimation, is 60 to 65%. The left ventricle has normal function. The left ventricle has no regional wall motion abnormalities. The left ventricular internal cavity size was normal in size. There is  moderate left ventricular hypertrophy. Left ventricular diastolic parameters are consistent with Grade I diastolic dysfunction (impaired relaxation). Right Ventricle: The right ventricular size is normal. No increase in right ventricular wall thickness. Right ventricular systolic function is normal. Left Atrium: Left atrial size was normal in size. Right Atrium: Right atrial size was normal in size. Pericardium: There is no evidence of pericardial effusion. Mitral Valve: The mitral valve is normal in structure. No evidence of mitral valve regurgitation. No evidence of mitral valve stenosis. Tricuspid Valve: The tricuspid valve is normal in structure. Tricuspid valve regurgitation is not demonstrated. No evidence of tricuspid stenosis. Aortic Valve: The aortic valve is tricuspid. There is moderate calcification of the aortic valve. There is moderate thickening of the aortic valve. Aortic valve regurgitation is not visualized. Mild to moderate aortic valve sclerosis/calcification is  present, without any evidence of aortic stenosis. Aortic valve mean gradient measures 6.0 mmHg. Aortic valve peak gradient measures 11.0 mmHg. Aortic valve area, by VTI measures 1.82 cm. Pulmonic Valve: The pulmonic valve was normal in structure. Pulmonic valve regurgitation is not visualized. No evidence of pulmonic stenosis. Aorta: The aortic root is normal in size and structure. Venous: The inferior vena cava is normal in size with greater than 50% respiratory variability, suggesting right atrial pressure of 3 mmHg. IAS/Shunts: No atrial level shunt detected by color flow Doppler.  LEFT VENTRICLE PLAX 2D LVIDd:         3.20 cm  Diastology LVIDs:         2.20 cm  LV e' medial:    6.53 cm/s LV PW:         1.80 cm  LV E/e' medial:  9.5 LV IVS:        1.70 cm  LV e' lateral:   5.00 cm/s LVOT diam:     2.10 cm  LV E/e' lateral: 12.4 LV SV:         42 LV SV Index:   20 LVOT Area:     3.46 cm  RIGHT VENTRICLE             IVC RV Basal diam:  2.60 cm     IVC diam: 1.30 cm RV S prime:     21.80 cm/s TAPSE (M-mode): 2.4 cm LEFT ATRIUM           Index       RIGHT ATRIUM           Index LA diam:      3.30 cm 1.59 cm/m  RA Area:     12.30 cm LA Vol (A2C): 17.4 ml 8.37 ml/m  RA Volume:   24.50 ml  11.78 ml/m LA Vol (A4C): 35.3 ml 16.97 ml/m  AORTIC VALVE AV Area (Vmax):  1.88 cm AV Area (Vmean):   1.72 cm AV Area (VTI):     1.82 cm AV Vmax:           166.00 cm/s AV Vmean:          115.000 cm/s AV VTI:            0.232 m AV Peak Grad:      11.0 mmHg AV Mean Grad:      6.0 mmHg LVOT Vmax:         90.10 cm/s LVOT Vmean:        57.100 cm/s LVOT VTI:          0.122 m LVOT/AV VTI ratio: 0.53  AORTA Ao Root diam: 3.20 cm Ao Asc diam:  3.00 cm MITRAL VALVE MV Area (PHT): 3.37 cm     SHUNTS MV Decel Time: 225 msec     Systemic VTI:  0.12 m MV E velocity: 61.80 cm/s   Systemic Diam: 2.10 cm MV A velocity: 105.00 cm/s MV E/A ratio:  0.59 Donato Schultz MD Electronically signed by Donato Schultz MD Signature Date/Time:  02/04/2021/5:25:46 PM    Final     Labs: BMET Recent Labs  Lab 02/04/21 0617 02/04/21 0630 02/04/21 9147 02/04/21 8295 02/04/21 6213 02/04/21 1202 02/04/21 2139 02/05/21 0346  NA 140 140 141 142 142 142 136 135  K 5.2* 5.4* 4.9 6.2* 6.5* 6.3* 5.0 4.0  CL 103  --   --   --  110 107 103 95*  CO2 18*  --   --   --   --  15* 22 26  GLUCOSE 131*  --   --   --  87 87 132* 135*  BUN 18  --   --   --  29* 20 23* 25*  CREATININE 3.46*  --   --   --  3.30* 3.22* 2.39* 2.16*  CALCIUM 9.0  --   --   --   --  8.3* 8.0* 7.8*  PHOS  --   --   --   --   --  5.1*  --  4.8*   CBC Recent Labs  Lab 02/04/21 0617 02/04/21 0630 02/04/21 0638 02/04/21 0924 02/04/21 0928  WBC 15.0*  --   --   --   --   NEUTROABS 11.7*  --   --   --   --   HGB 17.0 18.0* 17.3* 18.0* 18.0*  HCT 54.3* 53.0* 51.0 53.0* 53.0*  MCV 105.6*  --   --   --   --   PLT 207  --   --   --   --     Medications:    . Chlorhexidine Gluconate Cloth  6 each Topical Daily  . heparin  5,000 Units Subcutaneous Q8H  . mouth rinse  15 mL Mouth Rinse BID  . metoprolol tartrate  12.5 mg Oral BID  . vancomycin variable dose per unstable renal function (pharmacist dosing)   Does not apply See admin instructions   Zetta Bills, MD 02/05/2021, 9:11 AM

## 2021-02-05 NOTE — Progress Notes (Signed)
eLink Physician-Brief Progress Note Patient Name: Shane Guzman DOB: [birth year] MRN: [id number]   Date of Service  02/05/2021  HPI/Events of Note  Patient admitted with respiratory depression from probable drug overdose, now asking for escalating  amounts of pain medication despite the absence of objective evidence of a focus of his pain.  eICU Interventions  No further escalation of narcotics. Patient has a history of OSA / obesity hypoventilation syndrome.        [name] John U Jaine Estabrooks 02/05/2021, 1:15 AM

## 2021-02-06 ENCOUNTER — Inpatient Hospital Stay (HOSPITAL_COMMUNITY): Payer: BC Managed Care – PPO

## 2021-02-06 DIAGNOSIS — R609 Edema, unspecified: Secondary | ICD-10-CM

## 2021-02-06 DIAGNOSIS — Z9989 Dependence on other enabling machines and devices: Secondary | ICD-10-CM

## 2021-02-06 DIAGNOSIS — J9601 Acute respiratory failure with hypoxia: Secondary | ICD-10-CM

## 2021-02-06 DIAGNOSIS — G9341 Metabolic encephalopathy: Secondary | ICD-10-CM

## 2021-02-06 DIAGNOSIS — J9602 Acute respiratory failure with hypercapnia: Secondary | ICD-10-CM

## 2021-02-06 LAB — GLUCOSE, CAPILLARY: Glucose-Capillary: 124 mg/dL — ABNORMAL HIGH (ref 70–99)

## 2021-02-06 LAB — CBC
HCT: 45 % (ref 39.0–52.0)
Hemoglobin: 15.2 g/dL (ref 13.0–17.0)
MCH: 33.5 pg (ref 26.0–34.0)
MCHC: 33.8 g/dL (ref 30.0–36.0)
MCV: 99.1 fL (ref 80.0–100.0)
Platelets: 135 10*3/uL — ABNORMAL LOW (ref 150–400)
RBC: 4.54 MIL/uL (ref 4.22–5.81)
RDW: 11.8 % (ref 11.5–15.5)
WBC: 13.6 10*3/uL — ABNORMAL HIGH (ref 4.0–10.5)
nRBC: 0 % (ref 0.0–0.2)

## 2021-02-06 LAB — COMPREHENSIVE METABOLIC PANEL
ALT: 428 U/L — ABNORMAL HIGH (ref 0–44)
AST: 1243 U/L — ABNORMAL HIGH (ref 15–41)
Albumin: 2.6 g/dL — ABNORMAL LOW (ref 3.5–5.0)
Alkaline Phosphatase: 50 U/L (ref 38–126)
Anion gap: 11 (ref 5–15)
BUN: 24 mg/dL — ABNORMAL HIGH (ref 6–20)
CO2: 26 mmol/L (ref 22–32)
Calcium: 8 mg/dL — ABNORMAL LOW (ref 8.9–10.3)
Chloride: 95 mmol/L — ABNORMAL LOW (ref 98–111)
Creatinine, Ser: 1.68 mg/dL — ABNORMAL HIGH (ref 0.61–1.24)
GFR, Estimated: 51 mL/min — ABNORMAL LOW (ref >60)
Glucose, Bld: 124 mg/dL — ABNORMAL HIGH (ref 70–99)
Potassium: 3.9 mmol/L (ref 3.5–5.1)
Sodium: 132 mmol/L — ABNORMAL LOW (ref 135–145)
Total Bilirubin: 1.1 mg/dL (ref 0.3–1.2)
Total Protein: 6 g/dL — ABNORMAL LOW (ref 6.5–8.1)

## 2021-02-06 LAB — LACTATE DEHYDROGENASE: LDH: 2350 U/L — ABNORMAL HIGH (ref 98–192)

## 2021-02-06 LAB — CK: Total CK: >50000 U/L — ABNORMAL HIGH (ref 49–397)

## 2021-02-06 MED ORDER — SIMETHICONE 80 MG PO CHEW
80.0000 mg | CHEWABLE_TABLET | Freq: Four times a day (QID) | ORAL | Status: DC | PRN
  Administered 2021-02-06 – 2021-02-08 (×4): 80 mg via ORAL
  Filled 2021-02-06 (×4): qty 1

## 2021-02-06 MED ORDER — SENNOSIDES-DOCUSATE SODIUM 8.6-50 MG PO TABS
1.0000 | ORAL_TABLET | Freq: Two times a day (BID) | ORAL | Status: DC
  Administered 2021-02-06 – 2021-02-11 (×9): 1 via ORAL
  Filled 2021-02-06 (×9): qty 1

## 2021-02-06 MED ORDER — FUROSEMIDE 10 MG/ML IJ SOLN
20.0000 mg | Freq: Once | INTRAMUSCULAR | Status: AC
  Administered 2021-02-06: 20 mg via INTRAVENOUS
  Filled 2021-02-06: qty 2

## 2021-02-06 MED ORDER — FAMOTIDINE 20 MG PO TABS
10.0000 mg | ORAL_TABLET | Freq: Every day | ORAL | Status: DC
  Administered 2021-02-07 – 2021-02-11 (×5): 10 mg via ORAL
  Filled 2021-02-06 (×5): qty 1

## 2021-02-06 MED ORDER — CALCIUM CARBONATE ANTACID 500 MG PO CHEW
1.0000 | CHEWABLE_TABLET | Freq: Once | ORAL | Status: AC
  Administered 2021-02-06: 200 mg via ORAL
  Filled 2021-02-06: qty 1

## 2021-02-06 MED ORDER — POLYETHYLENE GLYCOL 3350 17 G PO PACK
17.0000 g | PACK | Freq: Every day | ORAL | Status: DC
  Administered 2021-02-07 – 2021-02-11 (×4): 17 g via ORAL
  Filled 2021-02-06 (×4): qty 1

## 2021-02-06 NOTE — Evaluation (Signed)
Physical Therapy Evaluation Patient Details Name: Shane Guzman MRN: [id number] DOB: [birth year] Today's Date: 02/06/2021   History of Present Illness  45 year old man with obesity, hypertension, history of sigmoid diverticulitis (perforation 2012), OSA on CPAP with marginal compliance, tobacco and alcohol use.  He was brought to the ED 2/18 after being unresponsive and difficult to arouse in the early morning. Pt with pinpoint pupils, agonal breathing and foaming at the mouth, EMS with brief stent of CPR, came too with narcan. drug screen was negative.    Clinical Impression  Pt presenting with significant L LE swelling and numbness, as in no sensation of L LE. Per chart ortho consult now placed for possible compartment syndrome. Pt however is able to initiate movment, complete quad set, LAQ and ankle pump however no proprioception or sensation. Pt was able to perform lateral scoot to drop arm recliner today with modA.  Pt was indep PTA and working. Pt to benefit from CIR Upon d/c to maximize functional recovery. Acute PT to cont to follow to progress mobility as able.    Follow Up Recommendations CIR    Equipment Recommendations   (TBD at next venue)    Recommendations for Other Services Rehab consult     Precautions / Restrictions Precautions Precautions: Fall Precaution Comments: L LE very swollen and numb Restrictions Weight Bearing Restrictions: No      Mobility  Bed Mobility Overal bed mobility: Needs Assistance Bed Mobility: Supine to Sit     Supine to sit: Min assist;HOB elevated     General bed mobility comments: minA for L LE management off EOB, HOB elevated, pt used UEs on bed rails to pull self up, pt with c/o back pain    Transfers Overall transfer level: Needs assistance Equipment used: None Transfers: Lateral/Scoot Transfers          Lateral/Scoot Transfers: Mod assist;+2 safety/equipment (2nd person to hold chair in place) General transfer comment:  due to pt with increased pain in back and no sensation in L LE deferred standing and complete lateral scoot transfer, max directional verbal cues, PT providing stability to L foot on the floor to allow for pt to use L LE to push off of for transfer, as pt cannot feel his foot on the floor.  Ambulation/Gait                Stairs            Wheelchair Mobility    Modified Rankin (Stroke Patients Only)       Balance Overall balance assessment: Needs assistance Sitting-balance support: Feet supported;Bilateral upper extremity supported Sitting balance-Leahy Scale: Fair Sitting balance - Comments: reliant on UEs to off set back pain       Standing balance comment: deferred                             Pertinent Vitals/Pain Pain Assessment: 0-10 Pain Score: 10-Worst pain ever Pain Location: back Pain Descriptors / Indicators: Discomfort Pain Intervention(s): RN gave pain meds during session (given phentyl)    Home Living Family/patient expects to be discharged to:: Private residence Living Arrangements: Alone Available Help at Discharge: Family;Friend(s);Available 24 hours/day (per friend [name]) Type of Home: House Home Access: Stairs to enter Entrance Stairs-Rails: Left Entrance Stairs-Number of Steps: 11 Home Layout: One level Home Equipment: None      Prior Function Level of Independence: Independent         Comments:  works as a Engineer, [employer]   Dominant Hand: Right    Extremity/Trunk Assessment   Upper Extremity Assessment Upper Extremity Assessment: Overall WFL for tasks assessed    Lower Extremity Assessment Lower Extremity Assessment: LLE deficits/detail LLE Deficits / Details: extremely swollen, especially hip/thigh, pt with no sensation or proprioception sensation ie, can't tell if foot was up or down, however is able to complete quad set, move ankle in DF/PF, glut sets and initiate L LE LAQ at EOB but  can't not feel anything LLE Sensation: decreased light touch;decreased proprioception    Cervical / Trunk Assessment Cervical / Trunk Assessment: Other exceptions Cervical / Trunk Exceptions: obese, large body habitus  Communication   Communication: No difficulties  Cognition Arousal/Alertness: Awake/alert Behavior During Therapy: WFL for tasks assessed/performed Overall Cognitive Status: Within Functional Limits for tasks assessed                                 General Comments: pt mildly irritated, s/p given IV pain meds pt was very sleepy - [name] says "thats what it makes him do"      General Comments General comments (skin integrity, edema, etc.): L LE extremely swollen especially at hip and thigh    Exercises General Exercises - Lower Extremity Ankle Circles/Pumps: AROM;Both;10 reps;Supine Quad Sets: AROM;Left;10 reps;Supine Gluteal Sets: AROM;Both;10 reps;Supine Long Arc Quad: AROM;Left;10 reps;Seated   Assessment/Plan    PT Assessment Patient needs continued PT services  PT Problem List Decreased strength;Decreased range of motion;Decreased activity tolerance;Decreased balance;Decreased mobility;Decreased coordination;Decreased cognition;Decreased knowledge of use of DME;Decreased safety awareness;Pain;Impaired sensation       PT Treatment Interventions DME instruction;Gait training;Stair training;Functional mobility training;Therapeutic activities;Therapeutic exercise;Balance training;Neuromuscular re-education    PT Goals (Current goals can be found in the Care Plan section)  Acute Rehab PT Goals Patient Stated Goal: stop the pain and feel my left leg PT Goal Formulation: With patient Time For Goal Achievement: 02/20/21 Potential to Achieve Goals: Good    Frequency Min 4X/week   Barriers to discharge        Co-evaluation               AM-PAC PT "6 Clicks" Mobility  Outcome Measure Help needed turning from your back to your side while  in a flat bed without using bedrails?: A Little Help needed moving from lying on your back to sitting on the side of a flat bed without using bedrails?: A Lot Help needed moving to and from a bed to a chair (including a wheelchair)?: A Lot Help needed standing up from a chair using your arms (e.g., wheelchair or bedside chair)?: A Lot Help needed to walk in hospital room?: Total Help needed climbing 3-5 steps with a railing? : Total 6 Click Score: 11    End of Session   Activity Tolerance: Patient limited by pain Patient left: in chair;with call bell/phone within reach;with chair alarm set;with family/visitor present;with nursing/sitter in room Nurse Communication: Mobility status PT Visit Diagnosis: Unsteadiness on feet (R26.81);Muscle weakness (generalized) (M62.81);Difficulty in walking, not elsewhere classified (R26.2)    Time: 2831-5176 PT Time Calculation (min) (ACUTE ONLY): 24 min   Charges:   PT Evaluation $PT Eval Moderate Complexity: 1 Mod PT Treatments $Therapeutic Activity: 8-22 mins        Lewis Shock, PT, DPT Acute Rehabilitation Services Pager #: [phone] Office #: [phone]   Iona Hansen 02/06/2021,  2:01 PM

## 2021-02-06 NOTE — Progress Notes (Signed)
PROGRESS NOTE    DEKLEN POPELKA  WUJ:[id number] DOB: Jul 02, 1976 DOA: 02/04/2021 PCP: Gaspar Garbe, MD    Chief Complaint  Patient presents with  . Altered Mental Status    Brief Narrative:  45 year old man with obesity, hypertension, history of sigmoid diverticulitis (perforation 2012), OSA on CPAP with marginal compliance, tobacco and alcohol use.  He was brought to the ED 2/18 after being unresponsive and difficult to arouse in the early morning.  She attempted to awaken him noted he was diaphoretic, noted some foaming of the mouth.  No witnessed seizure activity.  He was not wearing his CPAP.  EMS was activated and he had a pulse with agonal respiratory pattern and hypoxemia.  He received Narcan with some improvement in his mental status, evolving agitation.  He received Ativan on arrival to the ED, was requiring 6 L/min by nasal cannula.  Was hypotensive with an elevated lactic acid 8.2 so received aggressive volume resuscitation.  Drug screen negative with the exception of positive ethanol 84.  Initial ABG with a mixed metabolic and respiratory acidosis.  BMP with acute renal failure, associated acidosis, hyperkalemia.  Elevated WBC 15, mild transaminitis.    CT abdomen shows mild bilateral posterior basilar subsegmental atelectasis versus infiltrate.  No hydronephrosis or renal obstruction, normal liver, normal pancreas, normal stomach and appendix.  Sigmoid diverticulosis with some focal wall thickening in the proximal sigmoid and minimal surrounding inflammatory change.  Head and cervical CT were both normal   Cultures were obtained and he was treated empirically with cefepime and vancomycin for possible septic shock.  After patient was more awake he was able to confirm that he had some emesis since 2/16 but was able to tolerate p.o. in the evening 2/17.  No fever, diarrhea, urinary symptoms.   Found to have rhabdomyolysis  Subjective:  C/o left thigh/leg pain, swollen,  decreased sensation, left foot drop  Assessment & Plan:   Active Problems:   Acute renal failure (ARF) (HCC)   Rhabdomyolysis/left thigh edema/pain, left foot drop, decreased sensation/numbness  in left leg -There is a concern patient had peroneal nerve injury when he passed out and leg was left in awkward position -Received hydration, however CK remains significant elevated, continue to have left thigh pain/ edema/decreased sensation in left leg -ABI obtained yesterday, did report triphasic waveforms but showed a generalized falsely elevated waves, noncompressible bilateral lower extremity arteries - will consult orthopedic due to concern of compartment syndrome, case discussed with Ortho Dr. Charlann Boxer who will see this patient in consult  AKI likely due to rhabdomyolysis,  -Hyperkalemia on presentation, potassium normalized -Continue IV hydration/lasix -Creatinine improving -Nephrology following  Transaminitis -Likely due to rhabdomyolysis -check hepatitis panel -Monitor LFT  Acute hypoxic hypercapnic respiratory failure /acute metabolic encephalopathy /possible aspiration pneumonitis/pneumonia, present on admission -No acute findings on CT head or CT cervical spine, UDS negative, alcohol level 84 -Initial ABG on arrival pH 7.18, PCO2 46, PO2 76 -Brought to the ED on nonrebreather, later 6 L oxygen in the ED, he did not require intubation, mentation improved, he is weaned to room air, transferred to hospitalist service on February 20 -he was started on vancomycin/cefepime/Flagyl on admission , this change to Augmentin for possible aspiration pneumonitis/pneumonia ( Ct showed "Mild bilateral posterior basilar subsegmental atelectasis or infiltrates are noted" -Blood culture/urine culture no growth -he does has leukocytosis, will continue augmentin , plan  total 5 days abx (3/5)   HTN:  Home med diovan/HCTZ held due to [employer] compliance  with cpap. Weight loss for better bp  control bp currently stable   Focal wall thickening is seen involving the proximal sigmoid colon with minimal surrounding inflammatory changes; it is uncertain if this represents acute diverticulitis or sequela of previous such inflammation. -Currently denies abdominal pain, he does has a history of diverticulitis with perforation in 2012 -Monitor, prevent constipation  Obesity/OSA Body mass index is 39.36 kg/m. Per HPI, patient is noncompliant with CPAP at home CPAP nightly here and as needed when asleep during the daytime     Unresulted Labs (From admission, onward)          Start     Ordered   02/07/21 0500  CBC with Differential/Platelet  Tomorrow morning,   R       Question:  Specimen collection method  Answer:  Lab=Lab collect   02/06/21 1246   02/07/21 0500  Comprehensive metabolic panel  Tomorrow morning,   R       Question:  Specimen collection method  Answer:  Lab=Lab collect   02/06/21 1246   02/07/21 0500  Hepatitis panel, acute  Tomorrow morning,   R       Question:  Specimen collection method  Answer:  Lab=Lab collect   02/06/21 1246            DVT prophylaxis: heparin injection 5,000 Units Start: 02/04/21 1400   Code Status: Full Family Communication: significant other over the phone Disposition:   Status is: Inpatient  Dispo: The patient is from: Home              Anticipated d/c is to: To be determined              Anticipated d/c date is:  Patient currently is not medically stable to discharge  Consultants:   Critical care  Nephrology  Orthopedics  Procedures:   None  Antimicrobials:   Antibiotics Given (last 72 hours)    Date/Time Action Medication Dose Rate   02/04/21 0812 New Bag/Given   metroNIDAZOLE (FLAGYL) IVPB 500 mg 500 mg 100 mL/hr   02/04/21 2409 New Bag/Given   ceFEPIme (MAXIPIME) 2 g in sodium chloride 0.9 % 100 mL IVPB 2 g 200 mL/hr   02/04/21 7353 New Bag/Given   vancomycin (VANCOREADY) IVPB 2000 mg/400 mL 2,000 mg  200 mL/hr   02/05/21 0919 New Bag/Given   ceFEPIme (MAXIPIME) 2 g in sodium chloride 0.9 % 100 mL IVPB 2 g 200 mL/hr   02/05/21 1411 Given   amoxicillin-clavulanate (AUGMENTIN) 875-125 MG per tablet 1 tablet 1 tablet    02/05/21 2119 Given   amoxicillin-clavulanate (AUGMENTIN) 875-125 MG per tablet 1 tablet 1 tablet    02/06/21 0818 Given   amoxicillin-clavulanate (AUGMENTIN) 875-125 MG per tablet 1 tablet 1 tablet           Objective: Vitals:   02/05/21 2300 02/06/21 0040 02/06/21 0700 02/06/21 1134  BP: (!) 148/108 125/87 (!) 124/101 (!) 133/92  Pulse: 94 86 93 84  Resp: (!) 28 19 15 18   Temp: 99.5 F (37.5 C) 98.4 F (36.9 C) 98.3 F (36.8 C) 98 F (36.7 C)  TempSrc:  Oral Oral Oral  SpO2: 91% 100% 95% 92%  Weight:      Height:        Intake/Output Summary (Last 24 hours) at 02/06/2021 1246 Last data filed at 02/06/2021 1120 Gross per 24 hour  Intake 3244.08 ml  Output 4475 ml  Net -1230.92 ml   02/08/2021  02/04/21 0615 02/04/21 1500 02/05/21 0500  Weight: 102.1 kg 104 kg 104 kg    Examination:  General exam: calm, NAD Respiratory system: Diminished at bases , no rales, no rhonchi, no wheezing . Respiratory effort normal. Cardiovascular system: S1 & S2 heard, RRR.  Gastrointestinal system: Abdomen is nondistended, soft and nontender. Normal bowel sounds heard. Central nervous system: Alert and oriented. No focal neurological deficits. Extremities: Left thigh/left lower extremity edema, decreased sensation, left foot drop, does have palpable pulse Skin: No rashes, lesions or ulcers Psychiatry: Judgement and insight appear normal. Mood & affect appropriate.     Data Reviewed: I have personally reviewed following labs and imaging studies  CBC: Recent Labs  Lab 02/04/21 0617 02/04/21 0630 02/04/21 8119 02/04/21 0924 02/04/21 0928 02/05/21 1453 02/06/21 0219  WBC 15.0*  --   --   --   --  13.1* 13.6*  NEUTROABS 11.7*  --   --   --   --   --   --    HGB 17.0   < > 17.3* 18.0* 18.0* 16.7 15.2  HCT 54.3*   < > 51.0 53.0* 53.0* 48.0 45.0  MCV 105.6*  --   --   --   --  97.0 99.1  PLT 207  --   --   --   --  135* 135*   < > = values in this interval not displayed.    Basic Metabolic Panel: Recent Labs  Lab 02/04/21 1202 02/04/21 2139 02/05/21 0346 02/05/21 1453 02/06/21 0219  NA 142 136 135 134* 132*  K 6.3* 5.0 4.0 3.9 3.9  CL 107 103 95* 92* 95*  CO2 15* GLUCOSE 87 132* 135* 136* 124*  BUN 20 23* 25* 24* 24*  CREATININE 3.22* 2.39* 2.16* 1.85* 1.68*  CALCIUM 8.3* 8.0* 7.8* 7.8* 8.0*  MG 2.5*  --  2.1  --   --   PHOS 5.1*  --  4.8*  --   --     GFR: Estimated Creatinine Clearance: 60.6 mL/min (A) (by C-G formula based on SCr of 1.68 mg/dL (H)).  Liver Function Tests: Recent Labs  Lab 02/04/21 0617 02/04/21 1202 02/04/21 2139 02/05/21 1453 02/06/21 0219  AST 143* 724* 1,669* 1,517* 1,243*  ALT 79* 257* 525* 526* 428*  ALKPHOS 91 77 72 60 50  BILITOT 0.8 0.5 1.0 1.1 1.1  PROT 7.7 7.5 6.4* 6.6 6.0*  ALBUMIN 4.0 3.8 3.0* 2.8* 2.6*    CBG: Recent Labs  Lab 02/04/21 1337 02/04/21 1935 02/04/21 2332 02/05/21 0323 02/06/21 0635  GLUCAP 109* 138* 132* 125* 124*     Recent Results (from the past 240 hour(s))  Culture, blood (Routine X 2) w Reflex to ID Panel     Status: None (Preliminary result)   Collection Time: 02/04/21  6:50 AM   Specimen: BLOOD  Result Value Ref Range Status   Specimen Description BLOOD RIGHT ANTECUBITAL  Final   Special Requests   Final    BOTTLES DRAWN AEROBIC ONLY Blood Culture results may not be optimal due to an inadequate volume of blood received in culture bottles   Culture   Final    NO GROWTH 2 DAYS Performed at [facility], [address], [place], [place] [address]    Report Status PENDING  Incomplete  Culture, blood (Routine X 2) w Reflex to ID Panel     Status: None (Preliminary result)   Collection Time: 02/04/21  7:55 AM  Specimen: BLOOD   Result Value Ref Range Status   Specimen Description BLOOD LEFT ANTECUBITAL  Final   Special Requests   Final    BOTTLES DRAWN AEROBIC AND ANAEROBIC Blood Culture results may not be optimal due to an inadequate volume of blood received in culture bottles   Culture   Final    NO GROWTH 2 DAYS Performed at [facility], [address], [place], [place] [address]    Report Status PENDING  Incomplete  Urine Culture     Status: None   Collection Time: 02/04/21  8:25 AM   Specimen: Urine, Random  Result Value Ref Range Status   Specimen Description URINE, RANDOM  Final   Special Requests NONE  Final   Culture   Final    NO GROWTH Performed at [facility], [address] [place], [place] [address]    Report Status 02/05/2021 FINAL  Final  Resp Panel by RT-PCR (Flu A&B, Covid) Nasopharyngeal Swab     Status: None   Collection Time: 02/04/21  9:25 AM   Specimen: Nasopharyngeal Swab; Nasopharyngeal(NP) swabs in vial transport medium  Result Value Ref Range Status   SARS Coronavirus 2 by RT PCR NEGATIVE NEGATIVE Final    Comment: (NOTE) SARS-CoV-2 target nucleic acids are NOT DETECTED.  The SARS-CoV-2 RNA is generally detectable in upper respiratory specimens during the acute phase of infection. The lowest concentration of SARS-CoV-2 viral copies this assay can detect is 138 copies/mL. A negative result does not preclude SARS-Cov-2 infection and should not be used as the sole basis for treatment or other patient management decisions. A negative result may occur with  improper specimen collection/handling, submission of specimen other than nasopharyngeal swab, presence of viral mutation(s) within the areas targeted by this assay, and inadequate number of viral copies(<138 copies/mL). A negative result must be combined with clinical observations, patient history, and epidemiological information. The expected result is Negative.  Fact Sheet for Patients:   BloggerCourse.com  Fact Sheet for Healthcare Providers:  SeriousBroker.it  This test is no t yet approved or cleared by the [place] FDA and  has been authorized for detection and/or diagnosis of SARS-CoV-2 by FDA under an Emergency Use Authorization (EUA). This EUA will remain  in effect (meaning this test can be used) for the duration of the COVID-19 declaration under Section 564(b)(1) of the Act, 21 U.S.C.section 360bbb-3(b)(1), unless the authorization is terminated  or revoked sooner.       Influenza A by PCR NEGATIVE NEGATIVE Final   Influenza B by PCR NEGATIVE NEGATIVE Final    Comment: (NOTE) The Xpert Xpress SARS-CoV-2/FLU/RSV plus assay is intended as an aid in the diagnosis of influenza from Nasopharyngeal swab specimens and should not be used as a sole basis for treatment. Nasal washings and aspirates are unacceptable for Xpert Xpress SARS-CoV-2/FLU/RSV testing.  Fact Sheet for Patients: BloggerCourse.com  Fact Sheet for Healthcare Providers: SeriousBroker.it  This test is not yet approved or cleared by the [place] FDA and has been authorized for detection and/or diagnosis of SARS-CoV-2 by FDA under an Emergency Use Authorization (EUA). This EUA will remain in effect (meaning this test can be used) for the duration of the COVID-19 declaration under Section 564(b)(1) of the Act, 21 U.S.C. section 360bbb-3(b)(1), unless the authorization is terminated or revoked.  Performed at [facility], [address] [place], [place] [address]   MRSA PCR Screening     Status: None   Collection  Time: 02/04/21  1:37 PM   Specimen: Nasal Mucosa; Nasopharyngeal  Result Value Ref Range Status   MRSA by PCR NEGATIVE NEGATIVE Final    Comment:        The GeneXpert MRSA Assay (FDA approved for NASAL specimens only), is one component of a comprehensive MRSA  colonization surveillance program. It is not intended to diagnose MRSA infection nor to guide or monitor treatment for MRSA infections. Performed at [facility], [address], [place], [place] [address]   Blood Culture (routine x 2)     Status: None (Preliminary result)   Collection Time: 02/04/21  2:22 PM   Specimen: BLOOD RIGHT HAND  Result Value Ref Range Status   Specimen Description BLOOD RIGHT HAND  Final   Special Requests   Final    BOTTLES DRAWN AEROBIC ONLY Blood Culture results may not be optimal due to an inadequate volume of blood received in culture bottles   Culture   Final    NO GROWTH 2 DAYS Performed at [facility], [address], [place], [place] [address]    Report Status PENDING  Incomplete  Blood Culture (routine x 2)     Status: None (Preliminary result)   Collection Time: 02/04/21  2:37 PM   Specimen: BLOOD LEFT HAND  Result Value Ref Range Status   Specimen Description BLOOD LEFT HAND  Final   Special Requests   Final    BOTTLES DRAWN AEROBIC ONLY Blood Culture results may not be optimal due to an inadequate volume of blood received in culture bottles   Culture   Final    NO GROWTH 2 DAYS Performed at [facility], [address] [place], [place] [address]    Report Status PENDING  Incomplete         Radiology Studies: US RENAL  Result Date: 02/04/2021 CLINICAL DATA:  Acute renal failure. EXAM: RENAL / URINARY TRACT ULTRASOUND COMPLETE COMPARISON:  CT abdomen and pelvis today. FINDINGS: Right Kidney: Renal measurements: 11.6 x 5.7 x 5.3 cm = volume: 183.5 mL. Echogenicity within normal limits. No mass or hydronephrosis visualized. Left Kidney: Renal measurements: 12.1 x 6.5 x 6.0 cm = volume: 247 mL. Echogenicity within normal limits. No mass or hydronephrosis visualized. Bladder: Not visualized. Note is made that the urinary bladder is completely decompressed with a Foley catheter in place on the comparison CT. Other: None.  IMPRESSION: Negative for hydronephrosis.  Normal appearing kidneys. Electronically Signed   By: Drusilla Kanner M.D.   On: 02/04/2021 13:11   DG Chest Port 1 View  Result Date: 02/05/2021 CLINICAL DATA:  Concern for aspiration pneumonia EXAM: PORTABLE CHEST 1 VIEW COMPARISON:  Yesterday FINDINGS: Cardiomegaly. Enteric tube with tip at the stomach. Negative aortic and hilar contours for technique. Low volume chest with interstitial crowding and streaky density at the bases where there is opacity and volume loss on CT of the abdomen from yesterday. No edema . No effusion or pneumothorax. IMPRESSION: Stable lower lobe infiltrates as seen by chest CT yesterday. Electronically Signed   By: Marnee Spring M.D.   On: 02/05/2021 07:25   VAS [place] ABI WITH/WO TBI  Result Date: 02/05/2021 LOWER EXTREMITY DOPPLER STUDY Indications: Rest pain.  Limitations: Today's exam was limited due to patient positioning, involuntary              patient movement, bandages , patient unable to lie flat and IV,  ambient room "noise". Comparison Study: No prior studies. Performing Technologist: Olen Cordial RVT  Examination Guidelines: A complete evaluation includes at minimum, Doppler waveform signals and systolic blood pressure reading at the level of bilateral brachial, anterior tibial, and posterior tibial arteries, when vessel segments are accessible. Bilateral testing is considered an integral part of a complete examination. Photoelectric Plethysmograph (PPG) waveforms and toe systolic pressure readings are included as required and additional duplex testing as needed. Limited examinations for reoccurring indications may be performed as noted.  ABI Findings: +--------+------------------+-----+---------+----------------------------------+ Right   Rt Pressure (mmHg)IndexWaveform Comment                            +--------+------------------+-----+---------+----------------------------------+ Brachial                                 Unable to obtain waveform and                                              pressure due to IV and bandages    +--------+------------------+-----+---------+----------------------------------+ PTA     138               1.55 triphasic                                   +--------+------------------+-----+---------+----------------------------------+ DP      160               1.80 triphasic                                   +--------+------------------+-----+---------+----------------------------------+ +--------+------------------+-----+---------+-------+ Left    Lt Pressure (mmHg)IndexWaveform Comment +--------+------------------+-----+---------+-------+ [id number]                     triphasic        +--------+------------------+-----+---------+-------+ PTA     167               1.88 triphasic        +--------+------------------+-----+---------+-------+ DP      152               1.71 triphasic        +--------+------------------+-----+---------+-------+ +-------+-----------+-----------+------------+------------+ ABI/TBIToday's ABIToday's TBIPrevious ABIPrevious TBI +-------+-----------+-----------+------------+------------+ Right  1.8                                            +-------+-----------+-----------+------------+------------+ Left   1.88                                           +-------+-----------+-----------+------------+------------+  Summary: Right: Resting right ankle-brachial index indicates noncompressible right lower extremity arteries. Values are likely falsely elevated. Captured waveforms do not represent what was heard during the test. Triphasic waveforms were noted in the posterior tibial and dorsalis pedis arteries. Unable to obtain TBI due to patient constant movement. Left: Resting left ankle-brachial index indicates noncompressible left lower extremity arteries. Values are likely  falsely elevated. Captured  waveforms do not represent what was heard during the test. Triphasic waveforms were noted in the posterior tibial and dorsalis pedis arteries. Unable to obtain TBI due to patient constant movement.  *See table(s) above for measurements and observations.  Electronically signed by Heath Lark on 02/05/2021 at 11:05:12 AM.    Final    ECHOCARDIOGRAM COMPLETE  Result Date: 02/04/2021    ECHOCARDIOGRAM REPORT   Patient Name:   TANYON ALIPIO Date of Exam: 02/04/2021 Medical Rec #:  [id number]       Height:       65.0 in Accession #:    [id number]      Weight:       225.0 lb Date of Birth:  August 30, 1976       BSA:          2.080 m Patient Age:    44 years        BP:           139/121 mmHg Patient Gender: M               HR:           101 bpm. Exam Location:  Inpatient Procedure: 2D Echo, Cardiac Doppler and Color Doppler Indications:    Murmur  History:        Patient has no prior history of Echocardiogram examinations.                 Risk Factors:Sleep Apnea and Hypertension.  Sonographer:    Ross Ludwig RDCS (AE) Referring Phys: [id number] [facility] M REESE  Sonographer Comments: Suboptimal subcostal window. IMPRESSIONS  1. Left ventricular ejection fraction, by estimation, is 60 to 65%. The left ventricle has normal function. The left ventricle has no regional wall motion abnormalities. There is moderate left ventricular hypertrophy. Left ventricular diastolic parameters are consistent with Grade I diastolic dysfunction (impaired relaxation).  2. Right ventricular systolic function is normal. The right ventricular size is normal.  3. The mitral valve is normal in structure. No evidence of mitral valve regurgitation. No evidence of mitral stenosis.  4. The aortic valve is tricuspid. There is moderate calcification of the aortic valve. There is moderate thickening of the aortic valve. Aortic valve regurgitation is not visualized. Mild to moderate aortic valve sclerosis/calcification is present, without any evidence of  aortic stenosis.  5. The inferior vena cava is normal in size with greater than 50% respiratory variability, suggesting right atrial pressure of 3 mmHg. FINDINGS  Left Ventricle: Left ventricular ejection fraction, by estimation, is 60 to 65%. The left ventricle has normal function. The left ventricle has no regional wall motion abnormalities. The left ventricular internal cavity size was normal in size. There is  moderate left ventricular hypertrophy. Left ventricular diastolic parameters are consistent with Grade I diastolic dysfunction (impaired relaxation). Right Ventricle: The right ventricular size is normal. No increase in right ventricular wall thickness. Right ventricular systolic function is normal. Left Atrium: Left atrial size was normal in size. Right Atrium: Right atrial size was normal in size. Pericardium: There is no evidence of pericardial effusion. Mitral Valve: The mitral valve is normal in structure. No evidence of mitral valve regurgitation. No evidence of mitral valve stenosis. Tricuspid Valve: The tricuspid valve is normal in structure. Tricuspid valve regurgitation is not demonstrated. No evidence of tricuspid stenosis. Aortic Valve: The aortic valve is tricuspid. There is moderate calcification of the aortic valve. There is moderate thickening of the aortic valve. Aortic  valve regurgitation is not visualized. Mild to moderate aortic valve sclerosis/calcification is present, without any evidence of aortic stenosis. Aortic valve mean gradient measures 6.0 mmHg. Aortic valve peak gradient measures 11.0 mmHg. Aortic valve area, by VTI measures 1.82 cm. Pulmonic Valve: The pulmonic valve was normal in structure. Pulmonic valve regurgitation is not visualized. No evidence of pulmonic stenosis. Aorta: The aortic root is normal in size and structure. Venous: The inferior vena cava is normal in size with greater than 50% respiratory variability, suggesting right atrial pressure of 3 mmHg.  IAS/Shunts: No atrial level shunt detected by color flow Doppler.  LEFT VENTRICLE PLAX 2D LVIDd:         3.20 cm  Diastology LVIDs:         2.20 cm  LV e' medial:    6.53 cm/s LV PW:         1.80 cm  LV E/e' medial:  9.5 LV IVS:        1.70 cm  LV e' lateral:   5.00 cm/s LVOT diam:     2.10 cm  LV E/e' lateral: 12.4 LV SV:         42 LV SV Index:   20 LVOT Area:     3.46 cm  RIGHT VENTRICLE             IVC RV Basal diam:  2.60 cm     IVC diam: 1.30 cm RV S prime:     21.80 cm/s TAPSE (M-mode): 2.4 cm LEFT ATRIUM           Index       RIGHT ATRIUM           Index LA diam:      3.30 cm 1.59 cm/m  RA Area:     12.30 cm LA Vol (A2C): 17.4 ml 8.37 ml/m  RA Volume:   24.50 ml  11.78 ml/m LA Vol (A4C): 35.3 ml 16.97 ml/m  AORTIC VALVE AV Area (Vmax):    1.88 cm AV Area (Vmean):   1.72 cm AV Area (VTI):     1.82 cm AV Vmax:           166.00 cm/s AV Vmean:          115.000 cm/s AV VTI:            0.232 m AV Peak Grad:      11.0 mmHg AV Mean Grad:      6.0 mmHg LVOT Vmax:         90.10 cm/s LVOT Vmean:        57.100 cm/s LVOT VTI:          0.122 m LVOT/AV VTI ratio: 0.53  AORTA Ao Root diam: 3.20 cm Ao Asc diam:  3.00 cm MITRAL VALVE MV Area (PHT): 3.37 cm     SHUNTS MV Decel Time: 225 msec     Systemic VTI:  0.12 m MV E velocity: 61.80 cm/s   Systemic Diam: 2.10 cm MV A velocity: 105.00 cm/s MV E/A ratio:  0.59 Donato SchultzMark Skains MD Electronically signed by Donato SchultzMark Skains MD Signature Date/Time: 02/04/2021/5:25:46 PM    Final         Scheduled Meds: . amoxicillin-clavulanate  1 tablet Oral Q12H  . Chlorhexidine Gluconate Cloth  6 each Topical Daily  . heparin  5,000 Units Subcutaneous Q8H  . metoprolol tartrate  12.5 mg Oral BID  . senna-docusate  1 tablet Oral BID   Continuous Infusions: . sodium  chloride 150 mL/hr at 02/06/21 0644  . sodium chloride 10 mL/hr at 02/05/21 1100     LOS: 2 days   Time spent: 35 mins, case discussed with nephrology and orthopedic surgery Greater than 50% of this time was  spent in counseling, explanation of diagnosis, planning of further management, and coordination of care.   Voice Recognition Reubin Milan dictation system was used to create this note, attempts have been made to correct errors. Please contact the author with questions and/or clarifications.   Albertine Grates, MD PhD FACP [facility]  Available via Epic secure chat 7am-7pm for nonurgent issues Please page for urgent issues To page the attending provider between 7A-7P or the covering provider during after hours 7P-7A, please log into the web site [email or web address] and access using universal Ashley password for that web site. If you do not have the password, please call the hospital operator.    02/06/2021, 12:46 PM

## 2021-02-06 NOTE — Progress Notes (Signed)
Bilateral lower extremity venous duplex has been completed. Preliminary results can be found in CV Proc through chart review.   02/06/21 3:28 PM Olen Cordial RVT

## 2021-02-06 NOTE — Progress Notes (Signed)
Patient ID: Shane Guzman, male   DOB: [birth year], 45 y.o.   MRN: [id number] [facility] Progress Note   Assessment/ Plan:   1.  Acute kidney injury: Likely from a combination of volume contraction and pigment nephropathy from rhabdomyolysis.  Remains on intravenous fluids with decent urine output but appears to be developing some evidence of volume excess- I will order a dose of continuous furosemide to augment urine output.  Labs indicate continued evidence of renal recovery without concomitant abnormality requiring intervention. 2.  Rhabdomyolysis: Evidenced by physical exam-likely area affected is the left hip;  rising AST/ALT noted likely muscular in origin.  Recommend continuing current dose of intravenous fluids and persistent CPK >50,000. Curbside surgery to see if needs fasciotomy with persistently elevated CPK levels. 3.  Hyperkalemia: Secondary to acute kidney injury and rhabdomyolysis, corrected with Lokelma and bicarbonate drip. 4.  Anion gap metabolic acidosis: Secondary to acute kidney injury and lactic acidosis- corrected with sodium bicarbonate drip/volume expansion. 5.  Hypertension:  ARB/diuretics on hold in the setting of acute kidney injury and efforts at volume expansion.  Started on low-dose metoprolol yesterday for blood pressure control.  Subjective:   Continues to have persistent left thigh/hip pain and leg numbness.   Objective:   BP (!) 124/101 (BP Location: Left Arm)   Pulse 93   Temp 98.3 F (36.8 C) (Oral)   Resp 15   Ht 5\' 4"  (1.626 m)   Wt 104 kg   SpO2 95%   BMI 39.36 kg/m   Intake/Output Summary (Last 24 hours) at 02/06/2021 0903 Last data filed at 02/06/2021 0644 Gross per 24 hour  Intake 4408.32 ml  Output 3455 ml  Net 953.32 ml   Weight change:   Physical Exam: Gen: Appears comfortable resting in bed CVS: Pulse regular rhythm, normal rate, S1-S2 normal Resp: Clear to auscultation, no rales/rhonchi.  NGT in situ. Abd: Firm,  moderately distended, nontender to exam.  Bowel sounds distant Ext: Trace lower extremity edema.  Focal swelling/firm area over left lateral hip/thigh  Imaging: CT ABDOMEN PELVIS WO CONTRAST  Result Date: 02/04/2021 CLINICAL DATA:  Acute generalized abdominal pain. EXAM: CT ABDOMEN AND PELVIS WITHOUT CONTRAST TECHNIQUE: Multidetector CT imaging of the abdomen and pelvis was performed following the standard protocol without IV contrast. COMPARISON:  June 12, 2015. FINDINGS: Lower chest: Mild bilateral posterior basilar subsegmental atelectasis or infiltrates are noted. Hepatobiliary: No focal liver abnormality is seen. No gallstones, gallbladder wall thickening, or biliary dilatation. Pancreas: Unremarkable. No pancreatic ductal dilatation or surrounding inflammatory changes. Spleen: Normal in size without focal abnormality. Adrenals/Urinary Tract: Adrenal glands and kidneys appear normal. No hydronephrosis or renal obstruction is noted. No renal or ureteral calculi are noted. Urinary bladder is decompressed secondary to Foley catheter. Stomach/Bowel: Stomach is within normal limits. Appendix appears normal. No evidence of bowel obstruction. Sigmoid diverticulosis is noted. There is focal wall thickening involving the proximal sigmoid colon with minimal surrounding inflammatory changes; is uncertain if this represents acute diverticulitis or sequela of previous such inflammation. Vascular/Lymphatic: No significant vascular findings are present. No enlarged abdominal or pelvic lymph nodes. Reproductive: Prostate is unremarkable. Other: No abdominal wall hernia or abnormality. No abdominopelvic ascites. Musculoskeletal: No acute or significant osseous findings. IMPRESSION: 1. Mild bilateral posterior basilar subsegmental atelectasis or infiltrates are noted. 2. Focal wall thickening is seen involving the proximal sigmoid colon with minimal surrounding inflammatory changes; it is uncertain if this represents acute  diverticulitis or sequela of previous such inflammation. Electronically Signed  By: Lupita RaiderJames  Green Jr M.D.   On: 02/04/2021 09:28   US RENAL  Result Date: 02/04/2021 CLINICAL DATA:  Acute renal failure. EXAM: RENAL / URINARY TRACT ULTRASOUND COMPLETE COMPARISON:  CT abdomen and pelvis today. FINDINGS: Right Kidney: Renal measurements: 11.6 x 5.7 x 5.3 cm = volume: 183.5 mL. Echogenicity within normal limits. No mass or hydronephrosis visualized. Left Kidney: Renal measurements: 12.1 x 6.5 x 6.0 cm = volume: 247 mL. Echogenicity within normal limits. No mass or hydronephrosis visualized. Bladder: Not visualized. Note is made that the urinary bladder is completely decompressed with a Foley catheter in place on the comparison CT. Other: None. IMPRESSION: Negative for hydronephrosis.  Normal appearing kidneys. Electronically Signed   By: Drusilla Kannerhomas  Dalessio M.D.   On: 02/04/2021 13:11   DG Chest Port 1 View  Result Date: 02/05/2021 CLINICAL DATA:  Concern for aspiration pneumonia EXAM: PORTABLE CHEST 1 VIEW COMPARISON:  Yesterday FINDINGS: Cardiomegaly. Enteric tube with tip at the stomach. Negative aortic and hilar contours for technique. Low volume chest with interstitial crowding and streaky density at the bases where there is opacity and volume loss on CT of the abdomen from yesterday. No edema . No effusion or pneumothorax. IMPRESSION: Stable lower lobe infiltrates as seen by chest CT yesterday. Electronically Signed   By: Marnee SpringJonathon  Watts M.D.   On: 02/05/2021 07:25   DG Abd Portable 1 View  Result Date: 02/04/2021 CLINICAL DATA:  NG tube placement. EXAM: PORTABLE ABDOMEN - 1 VIEW COMPARISON:  11/07/2007. FINDINGS: 1018 hours. NG tube tip is in the mid stomach. Proximal side port of the NG tube is just above the GE junction. Visualized upper abdomen demonstrates nonspecific gas pattern. IMPRESSION: NG tube tip is in the mid stomach although proximal side port is above the GE junction. Tube could be advanced  approximately 5-6 cm for placement of the side port below the GE junction. Electronically Signed   By: Kennith CenterEric  Mansell M.D.   On: 02/04/2021 10:30   VAS US ABI WITH/WO TBI  Result Date: 02/05/2021 LOWER EXTREMITY DOPPLER STUDY Indications: Rest pain.  Limitations: Today's exam was limited due to patient positioning, involuntary              patient movement, bandages , patient unable to lie flat and IV,              ambient room "noise". Comparison Study: No prior studies. Performing Technologist: Olen Cordialollins, Greg RVT  Examination Guidelines: A complete evaluation includes at minimum, Doppler waveform signals and systolic blood pressure reading at the level of bilateral brachial, anterior tibial, and posterior tibial arteries, when vessel segments are accessible. Bilateral testing is considered an integral part of a complete examination. Photoelectric Plethysmograph (PPG) waveforms and toe systolic pressure readings are included as required and additional duplex testing as needed. Limited examinations for reoccurring indications may be performed as noted.  ABI Findings: +--------+------------------+-----+---------+----------------------------------+ Right   Rt Pressure (mmHg)IndexWaveform Comment                            +--------+------------------+-----+---------+----------------------------------+ Brachial                                Unable to obtain waveform and  pressure due to IV and bandages    +--------+------------------+-----+---------+----------------------------------+ PTA     138               1.55 triphasic                                   +--------+------------------+-----+---------+----------------------------------+ DP      160               1.80 triphasic                                   +--------+------------------+-----+---------+----------------------------------+ +--------+------------------+-----+---------+-------+  Left    Lt Pressure (mmHg)IndexWaveform Comment +--------+------------------+-----+---------+-------+ Brachial89                     triphasic        +--------+------------------+-----+---------+-------+ PTA     167               1.88 triphasic        +--------+------------------+-----+---------+-------+ DP      152               1.71 triphasic        +--------+------------------+-----+---------+-------+ +-------+-----------+-----------+------------+------------+ ABI/TBIToday's ABIToday's TBIPrevious ABIPrevious TBI +-------+-----------+-----------+------------+------------+ Right  1.8                                            +-------+-----------+-----------+------------+------------+ Left   1.88                                           +-------+-----------+-----------+------------+------------+  Summary: Right: Resting right ankle-brachial index indicates noncompressible right lower extremity arteries. Values are likely falsely elevated. Captured waveforms do not represent what was heard during the test. Triphasic waveforms were noted in the posterior tibial and dorsalis pedis arteries. Unable to obtain TBI due to patient constant movement. Left: Resting left ankle-brachial index indicates noncompressible left lower extremity arteries. Values are likely falsely elevated. Captured waveforms do not represent what was heard during the test. Triphasic waveforms were noted in the posterior tibial and dorsalis pedis arteries. Unable to obtain TBI due to patient constant movement.  *See table(s) above for measurements and observations.  Electronically signed by Heath Lark on 02/05/2021 at 11:05:12 AM.    Final    ECHOCARDIOGRAM COMPLETE  Result Date: 02/04/2021    ECHOCARDIOGRAM REPORT   Patient Name:   Shane Guzman Date of Exam: 02/04/2021 Medical Rec #:  [id number]       Height:       65.0 in Accession #:    [id number]      Weight:       225.0 lb Date of Birth:   1976-11-09       BSA:          2.080 m Patient Age:    44 years        BP:           139/121 mmHg Patient Gender: M               HR:           101 bpm. Exam Location:  Inpatient Procedure: 2D Echo, Cardiac Doppler and Color Doppler Indications:    Murmur  History:        Patient has no prior history of Echocardiogram examinations.                 Risk Factors:Sleep Apnea and Hypertension.  Sonographer:    Ross Ludwig RDCS (AE) Referring Phys: [id number] [facility] M REESE  Sonographer Comments: Suboptimal subcostal window. IMPRESSIONS  1. Left ventricular ejection fraction, by estimation, is 60 to 65%. The left ventricle has normal function. The left ventricle has no regional wall motion abnormalities. There is moderate left ventricular hypertrophy. Left ventricular diastolic parameters are consistent with Grade I diastolic dysfunction (impaired relaxation).  2. Right ventricular systolic function is normal. The right ventricular size is normal.  3. The mitral valve is normal in structure. No evidence of mitral valve regurgitation. No evidence of mitral stenosis.  4. The aortic valve is tricuspid. There is moderate calcification of the aortic valve. There is moderate thickening of the aortic valve. Aortic valve regurgitation is not visualized. Mild to moderate aortic valve sclerosis/calcification is present, without any evidence of aortic stenosis.  5. The inferior vena cava is normal in size with greater than 50% respiratory variability, suggesting right atrial pressure of 3 mmHg. FINDINGS  Left Ventricle: Left ventricular ejection fraction, by estimation, is 60 to 65%. The left ventricle has normal function. The left ventricle has no regional wall motion abnormalities. The left ventricular internal cavity size was normal in size. There is  moderate left ventricular hypertrophy. Left ventricular diastolic parameters are consistent with Grade I diastolic dysfunction (impaired relaxation). Right Ventricle: The right  ventricular size is normal. No increase in right ventricular wall thickness. Right ventricular systolic function is normal. Left Atrium: Left atrial size was normal in size. Right Atrium: Right atrial size was normal in size. Pericardium: There is no evidence of pericardial effusion. Mitral Valve: The mitral valve is normal in structure. No evidence of mitral valve regurgitation. No evidence of mitral valve stenosis. Tricuspid Valve: The tricuspid valve is normal in structure. Tricuspid valve regurgitation is not demonstrated. No evidence of tricuspid stenosis. Aortic Valve: The aortic valve is tricuspid. There is moderate calcification of the aortic valve. There is moderate thickening of the aortic valve. Aortic valve regurgitation is not visualized. Mild to moderate aortic valve sclerosis/calcification is present, without any evidence of aortic stenosis. Aortic valve mean gradient measures 6.0 mmHg. Aortic valve peak gradient measures 11.0 mmHg. Aortic valve area, by VTI measures 1.82 cm. Pulmonic Valve: The pulmonic valve was normal in structure. Pulmonic valve regurgitation is not visualized. No evidence of pulmonic stenosis. Aorta: The aortic root is normal in size and structure. Venous: The inferior vena cava is normal in size with greater than 50% respiratory variability, suggesting right atrial pressure of 3 mmHg. IAS/Shunts: No atrial level shunt detected by color flow Doppler.  LEFT VENTRICLE PLAX 2D LVIDd:         3.20 cm  Diastology LVIDs:         2.20 cm  LV e' medial:    6.53 cm/s LV PW:         1.80 cm  LV E/e' medial:  9.5 LV IVS:        1.70 cm  LV e' lateral:   5.00 cm/s LVOT diam:     2.10 cm  LV E/e' lateral: 12.4 LV SV:         42 LV SV Index:   20  LVOT Area:     3.46 cm  RIGHT VENTRICLE             IVC RV Basal diam:  2.60 cm     IVC diam: 1.30 cm RV S prime:     21.80 cm/s TAPSE (M-mode): 2.4 cm LEFT ATRIUM           Index       RIGHT ATRIUM           Index LA diam:      3.30 cm 1.59 cm/m   RA Area:     12.30 cm LA Vol (A2C): 17.4 ml 8.37 ml/m  RA Volume:   24.50 ml  11.78 ml/m LA Vol (A4C): 35.3 ml 16.97 ml/m  AORTIC VALVE AV Area (Vmax):    1.88 cm AV Area (Vmean):   1.72 cm AV Area (VTI):     1.82 cm AV Vmax:           166.00 cm/s AV Vmean:          115.000 cm/s AV VTI:            0.232 m AV Peak Grad:      11.0 mmHg AV Mean Grad:      6.0 mmHg LVOT Vmax:         90.10 cm/s LVOT Vmean:        57.100 cm/s LVOT VTI:          0.122 m LVOT/AV VTI ratio: 0.53  AORTA Ao Root diam: 3.20 cm Ao Asc diam:  3.00 cm MITRAL VALVE MV Area (PHT): 3.37 cm     SHUNTS MV Decel Time: 225 msec     Systemic VTI:  0.12 m MV E velocity: 61.80 cm/s   Systemic Diam: 2.10 cm MV A velocity: 105.00 cm/s MV E/A ratio:  0.59 Donato Schultz MD Electronically signed by Donato Schultz MD Signature Date/Time: 02/04/2021/5:25:46 PM    Final     Labs: BMET Recent Labs  Lab 02/04/21 0617 02/04/21 0630 02/04/21 1610 02/04/21 9604 02/04/21 1202 02/04/21 2139 02/05/21 0346 02/05/21 1453 02/06/21 0219  NA 140   < > 142 142 142 136 135 134* 132*  K 5.2*   < > 6.2* 6.5* 6.3* 5.0 4.0 3.9 3.9  CL 103  --   --  110 107 103 95* 92* 95*  CO2 18*  --   --   --  15* GLUCOSE 131*  --   --  87 87 132* 135* 136* 124*  BUN 18  --   --  29* 20 23* 25* 24* 24*  CREATININE 3.46*  --   --  3.30* 3.22* 2.39* 2.16* 1.85* 1.68*  CALCIUM 9.0  --   --   --  8.3* 8.0* 7.8* 7.8* 8.0*  PHOS  --   --   --   --  5.1*  --  4.8*  --   --    < > = values in this interval not displayed.   CBC Recent Labs  Lab 02/04/21 0617 02/04/21 0630 02/04/21 0924 02/04/21 0928 02/05/21 1453 02/06/21 0219  WBC 15.0*  --   --   --  13.1* 13.6*  NEUTROABS 11.7*  --   --   --   --   --   HGB 17.0   < > 18.0* 18.0* 16.7 15.2  HCT 54.3*   < > 53.0* 53.0* 48.0 45.0  MCV 105.6*  --   --   --  97.0 99.1  PLT 207  --   --   --  135* 135*   < > = values in this interval not displayed.    Medications:    . amoxicillin-clavulanate  1  tablet Oral Q12H  . Chlorhexidine Gluconate Cloth  6 each Topical Daily  . heparin  5,000 Units Subcutaneous Q8H  . metoprolol tartrate  12.5 mg Oral BID   Zetta Bills, MD 02/06/2021, 9:03 AM

## 2021-02-06 NOTE — Consult Note (Signed)
Reason for Consult: rule out compartment syndrome LLE Referring Physician: Lev, Cervone is an 45 y.o. male.  HPI: 45 year old man with obesity, hypertension, history of sigmoid diverticulitis (perforation 2012), OSA on CPAP with marginal compliance, tobacco and alcohol use. He was brought to the ED 2/18 after being unresponsive and difficult to arouse in the early morning. She attempted to awaken him noted he was diaphoretic, noted some foaming of the mouth. No witnessed seizure activity. He was not wearing his CPAP. EMS was activated and he had a pulse with agonal respiratory pattern and hypoxemia. He received Narcan with some improvement in his mental status, evolving agitation. He received Ativan on arrival to the ED, was requiring 6 L/min by nasal cannula. Was hypotensive with an elevated lactic acid 8.2 so received aggressive volume resuscitation. Drug screen negative with the exception of positive ethanol 84. Initial ABG with a mixed metabolic and respiratory acidosis.  Rather sleepy during exam but awakens to participate No pain complaints only complains that he has reduced sensibility in his left foot. Events not entirely clear other than that reported in H&P Was found in bed not on floor and no note of abnormal positioning of left leg  Past Medical History:  Diagnosis Date  . Diverticulitis of intestine with abscess   . Heart murmur   . Hypertension   . OSA on CPAP     Past Surgical History:  Procedure Laterality Date  . ACHILLES TENDON REPAIR  06/2010   left; S/P tendon rupture    Family History  Problem Relation Age of Onset  . Diverticulitis Maternal Grandmother     Social History:  reports that he has been smoking cigarettes. He has been smoking about 0.00 packs per day for the past 18.00 years. He has never used smokeless tobacco. He reports current alcohol use. He reports that he does not use drugs.  Allergies:  Allergies  Allergen Reactions  .  Lisinopril Swelling    Medications:  I have reviewed the patient's current medications. Scheduled: . amoxicillin-clavulanate  1 tablet Oral Q12H  . Chlorhexidine Gluconate Cloth  6 each Topical Daily  . heparin  5,000 Units Subcutaneous Q8H  . metoprolol tartrate  12.5 mg Oral BID  . polyethylene glycol  17 g Oral Daily  . senna-docusate  1 tablet Oral BID    Results for orders placed or performed during the hospital encounter of 02/04/21 (from the past 24 hour(s))  Comprehensive metabolic panel     Status: Abnormal   Collection Time: 02/06/21  2:19 AM  Result Value Ref Range   Sodium 132 (L) 135 - 145 mmol/L   Potassium 3.9 3.5 - 5.1 mmol/L   Chloride 95 (L) 98 - 111 mmol/L   CO2 26 22 - 32 mmol/L   Glucose, Bld 124 (H) 70 - 99 mg/dL   BUN 24 (H) 6 - 20 mg/dL   Creatinine, Ser 6.96 (H) 0.61 - 1.24 mg/dL   Calcium 8.0 (L) 8.9 - 10.3 mg/dL   Total Protein 6.0 (L) 6.5 - 8.1 g/dL   Albumin 2.6 (L) 3.5 - 5.0 g/dL   AST 7,893 (H) 15 - 41 U/L   ALT 428 (H) 0 - 44 U/L   Alkaline Phosphatase 50 38 - 126 U/L   Total Bilirubin 1.1 0.3 - 1.2 mg/dL   GFR, Estimated 51 (L) >60 mL/min   Anion gap 11 5 - 15  CK     Status: Abnormal   Collection Time: 02/06/21  2:19 AM  Result Value Ref Range   Total CK >50,000 (H) 49 - 397 U/L  Lactate dehydrogenase     Status: Abnormal   Collection Time: 02/06/21  2:19 AM  Result Value Ref Range   LDH 2,350 (H) 98 - 192 U/L  CBC     Status: Abnormal   Collection Time: 02/06/21  2:19 AM  Result Value Ref Range   WBC 13.6 (H) 4.0 - 10.5 K/uL   RBC 4.54 4.22 - 5.81 MIL/uL   Hemoglobin 15.2 13.0 - 17.0 g/dL   HCT 40.9 81.1 - 91.4 %   MCV 99.1 80.0 - 100.0 fL   MCH 33.5 26.0 - 34.0 pg   MCHC 33.8 30.0 - 36.0 g/dL   RDW 78.2 95.6 - 21.3 %   Platelets 135 (L) 150 - 400 K/uL   nRBC 0.0 0.0 - 0.2 %  Glucose, capillary     Status: Abnormal   Collection Time: 02/06/21  6:35 AM  Result Value Ref Range   Glucose-Capillary 124 (H) 70 - 99 mg/dL      X-ray: No pertinent X-rays at this time  ROS  As per HPI  Blood pressure (!) 133/92, pulse 81, temperature 98 F (36.7 C), temperature source Oral, resp. rate 14, height 5\' 4"  (1.626 m), weight 104 kg, SpO2 94 %.  Physical Exam  45 yo male sleepy with CPAP mask on BUE normal RLE normal motor and sensory exam  LLE exam: Decreased sensibility to touch lateral foot Unable to demonstrate active dorsiflexion of left foot and ankle  NO PAIN with passive stretch Lower leg is SOFT with tightness and this extends into the thigh Negative Tinel's exam at lateral knee, peroneal nerve region Palpable pulses with warm foot  Assessment/Plan: 1. Left foot drop likely related to peroneal nerve compression 2. NO findings clinically to be concerned with lower extremity compartment syndrome  Plan: Would recommend an AFO to support left foot until recovery to prevent equinus contracture Would consider if possible nerve conduction studies through Physiatry while in house or can be considered in outpatient setting either with Physiatrist affiliated with [facility] or at our office with Dr. 57 LLE with functional AFO   Georgian Co 02/06/2021, 4:06 PM

## 2021-02-06 NOTE — Progress Notes (Signed)
Inpatient Rehab Admissions Coordinator Note:   Per therapy recommendations, pt was screened for CIR candidacy by Megan Salon, MS CCC-SLP. At this time, Pt demonstrates functional decline but does not demonstrate medical necessity or CIR admission. Current  Deficits are likely explained by acute medical issues that will likely resolve once addressed (AKI, rhabdo, hyperkalemia, metabolic acidosis). If deficits persist once acute medical conditions are resolved, Pt. May benefit from rehab at a lower level of care.   Please contact me with questions.    Megan Salon, MS, CCC-SLP Rehab Admissions Coordinator  [phone] (celll) [phone] (office)

## 2021-02-06 NOTE — Progress Notes (Signed)
Pt. Arrived via Bed with nurse at bedside. CCMD notified with SR 85 on monitor. Orders given and carried out. F/C->SD with tea colored urine noted.  Pt. GCS-15 with c/o Abd. Pain. See PCR for vitals and pain interventions. .Will continue to monitor closely.

## 2021-02-06 NOTE — Evaluation (Signed)
Occupational Therapy Evaluation Patient Details Name: Shane Guzman MRN: [id number] DOB: 03-Mar-1976 Today's Date: 02/06/2021    History of Present Illness 45 year old man with obesity, hypertension, history of sigmoid diverticulitis (perforation 2012), OSA on CPAP with marginal compliance, tobacco and alcohol use.  He was brought to the ED 2/18 after being unresponsive and difficult to arouse in the early morning. Pt with pinpoint pupils, agonal breathing and foaming at the mouth, EMS with brief stent of CPR, came too with narcan. drug screen was negative.   Clinical Impression   This 45 yo male admitted with above presents to acute OT with PLOF of being totally independent with all basic ADLs, IADLs, driving, and working as a Production designer, theatre/television/film. Currently pt is experiencing 10/10 pain in back with any movement and needs setup/S-total A for basic ADLs at a bed level. He will continue to benefit from acute OT with follow up on CIR.    Follow Up Recommendations  CIR;Supervision/Assistance - 24 hour    Equipment Recommendations  Other (comment) (TBD next venue)       Precautions / Restrictions Precautions Precautions: Fall Precaution Comments: L LE very swollen and numb--ortho saw and does not suspect compartment syndrome Restrictions Weight Bearing Restrictions: No      Mobility Bed Mobility Overal bed mobility: Needs Assistance Bed Mobility: Rolling Rolling: Supervision;Min assist       General bed mobility comments: Min A to roll to right to A with LLE, S to roll to left           ADL either performed or assessed with clinical judgement   ADL Overall ADL's : Needs assistance/impaired Eating/Feeding: Independent;Bed level   Grooming: Set up;Bed level   Upper Body Bathing: Set up;Bed level   Lower Body Bathing: Total assistance;Bed level   Upper Body Dressing : Minimal assistance;Bed level   Lower Body Dressing: Total assistance;Bed level                        Vision Patient Visual Report: No change from baseline              Pertinent Vitals/Pain Pain Assessment: 0-10 Pain Score: 10-Worst pain ever Pain Location: back Pain Descriptors / Indicators: Grimacing;Guarding (when rolling to right) Pain Intervention(s): Premedicated before session (per pt)     Hand Dominance Right   Extremity/Trunk Assessment Upper Extremity Assessment Upper Extremity Assessment: Overall WFL for tasks assessed     Communication Communication Communication: No difficulties   Cognition Arousal/Alertness: Lethargic;Suspect due to medications (pt reported he had just recently gotten IV pain meds) Behavior During Therapy: WFL for tasks assessed/performed Overall Cognitive Status: Within Functional Limits for tasks assessed                                 General Comments: pt mildly irritated when he rolled right and his back started hurting, "they give me pain meds to help the pain then yall come in and ask me to move which makes the pain come back"   General Comments  L LE extremely swollen especially at hip and thigh            Home Living Family/patient expects to be discharged to:: Private residence Living Arrangements: Alone Available Help at Discharge: Family;Friend(s);Available 24 hours/day (per friend [name]) Type of Home: House Home Access: Stairs to enter [employer] of Steps: 11 Entrance Stairs-Rails: Left Home Layout:  One level     Bathroom Shower/Tub: Chief Strategy Officer: Standard     Home Equipment: None          Prior Functioning/Environment Level of Independence: Independent        Comments: works as a Actuary Problem List: Decreased strength;Decreased range of motion;Pain;Impaired sensation      OT Treatment/Interventions: Self-care/ADL training;DME and/or AE instruction;Patient/family education;Balance training    OT Goals(Current goals can be  found in the care plan section) Acute Rehab OT Goals Patient Stated Goal: pain in back to go away and my left leg to work OT Goal Formulation: With patient Time For Goal Achievement: 02/20/21 Potential to Achieve Goals: Good  OT Frequency: Min 2X/week              AM-PAC OT "6 Clicks" Daily Activity     Outcome Measure Help from another person eating meals?: None Help from another person taking care of personal grooming?: A Little Help from another person toileting, which includes using toliet, bedpan, or urinal?: Total Help from another person bathing (including washing, rinsing, drying)?: A Lot Help from another person to put on and taking off regular upper body clothing?: A Lot Help from another person to put on and taking off regular lower body clothing?: Total 6 Click Score: 13   End of Session    Activity Tolerance: Patient limited by lethargy (due to pain meds) Patient left: in bed;with call bell/phone within reach;with bed alarm set  OT Visit Diagnosis: Other abnormalities of gait and mobility (R26.89);Muscle weakness (generalized) (M62.81);Pain Pain - part of body:  (lower back with movement)                Time: 5364-6803 OT Time Calculation (min): 13 min Charges:  OT General Charges $OT Visit: 1 Visit OT Evaluation $OT Eval Moderate Complexity: 1 Mod  Ignacia Palma, OTR/L Acute [employer] Pager [phone] Office [phone]     Evette Georges 02/06/2021, 4:35 PM

## 2021-02-07 ENCOUNTER — Inpatient Hospital Stay (HOSPITAL_COMMUNITY): Payer: BC Managed Care – PPO

## 2021-02-07 DIAGNOSIS — N179 Acute kidney failure, unspecified: Secondary | ICD-10-CM | POA: Diagnosis not present

## 2021-02-07 LAB — COMPREHENSIVE METABOLIC PANEL
ALT: 335 U/L — ABNORMAL HIGH (ref 0–44)
AST: 938 U/L — ABNORMAL HIGH (ref 15–41)
Albumin: 2.5 g/dL — ABNORMAL LOW (ref 3.5–5.0)
Alkaline Phosphatase: 50 U/L (ref 38–126)
Anion gap: 8 (ref 5–15)
BUN: 19 mg/dL (ref 6–20)
CO2: 29 mmol/L (ref 22–32)
Calcium: 8.2 mg/dL — ABNORMAL LOW (ref 8.9–10.3)
Chloride: 98 mmol/L (ref 98–111)
Creatinine, Ser: 1.3 mg/dL — ABNORMAL HIGH (ref 0.61–1.24)
GFR, Estimated: >60 mL/min (ref >60)
Glucose, Bld: 115 mg/dL — ABNORMAL HIGH (ref 70–99)
Potassium: 3.7 mmol/L (ref 3.5–5.1)
Sodium: 135 mmol/L (ref 135–145)
Total Bilirubin: 1 mg/dL (ref 0.3–1.2)
Total Protein: 6 g/dL — ABNORMAL LOW (ref 6.5–8.1)

## 2021-02-07 LAB — CBC WITH DIFFERENTIAL/PLATELET
Abs Immature Granulocytes: 0.06 10*3/uL (ref 0.00–0.07)
Basophils Absolute: 0 10*3/uL (ref 0.0–0.1)
Basophils Relative: 0 %
Eosinophils Absolute: 0 10*3/uL (ref 0.0–0.5)
Eosinophils Relative: 0 %
HCT: 41.1 % (ref 39.0–52.0)
Hemoglobin: 13.6 g/dL (ref 13.0–17.0)
Immature Granulocytes: 0 %
Lymphocytes Relative: 12 %
Lymphs Abs: 1.6 10*3/uL (ref 0.7–4.0)
MCH: 33.3 pg (ref 26.0–34.0)
MCHC: 33.1 g/dL (ref 30.0–36.0)
MCV: 100.5 fL — ABNORMAL HIGH (ref 80.0–100.0)
Monocytes Absolute: 1.2 10*3/uL — ABNORMAL HIGH (ref 0.1–1.0)
Monocytes Relative: 9 %
Neutro Abs: 10.8 10*3/uL — ABNORMAL HIGH (ref 1.7–7.7)
Neutrophils Relative %: 79 %
Platelets: 126 10*3/uL — ABNORMAL LOW (ref 150–400)
RBC: 4.09 MIL/uL — ABNORMAL LOW (ref 4.22–5.81)
RDW: 11.6 % (ref 11.5–15.5)
WBC: 13.7 10*3/uL — ABNORMAL HIGH (ref 4.0–10.5)
nRBC: 0 % (ref 0.0–0.2)

## 2021-02-07 LAB — HEPATITIS PANEL, ACUTE
HCV Ab: NONREACTIVE
Hep A IgM: NONREACTIVE
Hep B C IgM: NONREACTIVE
Hepatitis B Surface Ag: NONREACTIVE

## 2021-02-07 LAB — CK: Total CK: >50000 U/L — ABNORMAL HIGH (ref 49–397)

## 2021-02-07 MED ORDER — OXYCODONE HCL 5 MG PO TABS
5.0000 mg | ORAL_TABLET | ORAL | Status: DC | PRN
  Administered 2021-02-07 – 2021-02-11 (×18): 5 mg via ORAL
  Filled 2021-02-07 (×19): qty 1

## 2021-02-07 MED ORDER — MORPHINE SULFATE (PF) 2 MG/ML IV SOLN
2.0000 mg | INTRAVENOUS | Status: DC | PRN
  Administered 2021-02-07 – 2021-02-11 (×11): 2 mg via INTRAVENOUS
  Filled 2021-02-07 (×11): qty 1

## 2021-02-07 MED ORDER — BISACODYL 10 MG RE SUPP
10.0000 mg | Freq: Once | RECTAL | Status: AC
  Administered 2021-02-07: 10 mg via RECTAL

## 2021-02-07 MED ORDER — MAGNESIUM CITRATE PO SOLN
1.0000 | Freq: Once | ORAL | Status: AC
  Administered 2021-02-07: 1 via ORAL
  Filled 2021-02-07: qty 296

## 2021-02-07 NOTE — Progress Notes (Signed)
PROGRESS NOTE    Shane Guzman  XVQ:[id number] DOB: May 20, 1976 DOA: 02/04/2021 PCP: Gaspar Garbe, MD   Chief Complaint  Patient presents with  . Altered Mental Status    Brief Narrative: 45 year old male with morbid obesity/OSA on CPAP with marginal compliance, tobacco and alcohol abuse, hypertension, chronic back pain, history of sigmoid diverticulitis with perforation in 2012 to the ED on 2/18 after being unresponsive and difficult to arouse in the early morning at home was diaphoretic with foaming of the mouth but no witnessed seizure activity.Apparently he has not been using CPAP.  Was brought by EMS, had a pulse with agonal respiratory pattern and hypoxia status post Narcan with some improvement in mental status with agitation then received Ativan on arrival to the ED and 6 L nasal cannula oxygen, was hypotensive with lactic acidosis 8.2, renal failure, metabolic and respiratory acidosis, internal positivity for drug screen was negative, lab with rhabdomyolysis leukocytosis transaminitis renal failure hyperkalemia acidosis. CT abdomen with mild bilateral posterior basilar subsegmental atelectasis versus infiltrate but no hydronephrosis, normal liver and pancreas with sigmoid diverticulosis with some focal wall thickening in the proximal sigmoid and minimal surrounding inflammatory change, negative CT head/CT C-spine. Empirically placed on vancomycin and cefepime for possible septic shock, with aggressive IV fluid hydration. Patient was admitted to critical care-treated for acute metabolic encephalopathy/hypoxic respiratory failure/rhabdomyolysis with severe AKI hyperkalemia metabolic acidosis.  Seen by nephrology. Patient has been also complaining of left lower leg weakness/foot drop and edema pain diminished sensation-seen by Dr. Jerrel Ivory orthopedics 2/20 no evidence of compartment syndrome blood flow likely from peroneal nerve compression advised nerve conduction study. 2/21-He remembers  hurting on the back ( can't' remember where else" remembers waking up in the hospital and since then c/o back pain and can't feel the left leg and cannot move it and it is hurting too.reports he has chronic back pain he sees which is unchanged. He is able to flex his left knee  Subjective: Seen and examined this morning. He remembers hurting on the back ( can't' remember where else" remembers waking up in the hospital and since then c/o back pain and can't feel the left leg and cannot move it and it is hurting too, has chronic back pain. he is able to flex his left knee On Roy o2, aaox3 this am Foley in place, no BM x 4 days but was not eating much he says Has chronic back pain similar in intensity   Assessment & Plan:  Acute kidney injury from combination of volume contraction/pigment nephropathy from rhabdomyolysis Hyperkalemia due to AKI Anion gap with a severe acidosis due to AKI Renal function nicely improving.  Seen by nephrology.  Continue IV hydration ongoing fluids as rhabdomyolysis improves.  Potassium normalized, bicarb normalized.  Encourage oral intake.  Foley in place hopefully we can remove soon.  Status post Lasix 2/20 per nephrology. Recent Labs  Lab 02/04/21 2139 02/05/21 0346 02/05/21 1453 02/06/21 0219 02/07/21 0126  BUN 23* 25* 24* 24* 19  CREATININE 2.39* 2.16* 1.85* 1.68* 1.30*   Rhabdomyolysis-atraumatic, likely with left hip affected, was found on the bed with one leg hanging, likely from prolonged immobilization.CK still remains more than 50 K CHECK CK daily, keep on IV fluid hydration and once CK improves we will start to wean down. MRI shows rt paraspinous myositis- blood cx neg from 2/18.  Left foot drop difficulty with movement, able to flex knees.  Seen by Dr. Charlann Boxer from orthopedics-suspecting peroneal nerve compression.  Advised AFO  to support left foot until recovery and Norco dyspnea study likely outpatient or with Dr.  Ethelene Hal from orthopedic.  Patient also  complains of chronic back issues, will obtain MRI lumbar spine to further eval. 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 Remains on nasal cannula supplement and CPAP at bedtime.  Antibiotic has been changed to Augmentin for 5 days total.Blood culture urine culture no growth so far.  Has leukocytosis and downtrending  Acute metabolic encephalopathy, was found unresponsive at home.CT head unremarkable. Mental status improved patient does not have much recollection of what happened. He is aaox3. Monitor.  Abnormal LFTs/transaminitis due to rhabdomyolysis.  LFTs downtrending.  Acute viral hepatitis panel was unremarkable.  Hypertension holding ARB/diuretics due to AKI.  Was placed on low-dose metoprolol 2/19.  Blood pressure is stable.  Morbid Obesity w BMI > 39: Will benefit with outpatient follow-up and weight loss  OSA on CPAP with marginal compliance.  Continue bedtime CPAP.  On supplemental nasal cannula oxygen wean as tolerated.  Focal wall thickening incidentally noted on the CT abdomen on proximal sigmoid colon-uncertain if acute diverticulitis or sequelae of previous episode no abdominal pain.  Nutrition: Diet Order            Diet Heart Room service appropriate? Yes with Assist; Fluid consistency: Thin  Diet effective now               Pt's Body mass index is 39.96 kg/m.  DVT prophylaxis: heparin injection 5,000 Units Start: 02/04/21 1400 Code Status:   Code Status: Full Code  Family Communication: plan of care discussed with patient at bedside.  Status is: Inpatient Remains inpatient appropriate because:Ongoing diagnostic testing needed not appropriate for outpatient work up, IV treatments appropriate due to intensity of illness or inability to take PO and Inpatient level of care appropriate due  to severity of illness  Dispo: The patient is from: apartment with girl friend and son              Anticipated d/c is to: TBD              Anticipated d/c date is: 3 days              Patient currently is not medically stable to d/c.   Difficult to place patient No  Consultants:see note  Procedures:see note  Unresulted Labs (From admission, onward)          Start     Ordered   02/08/21 0500  CK  Daily,   R     Question:  Specimen collection method  Answer:  Lab=Lab collect   02/07/21 0728   02/08/21 0500  CBC  Daily,   R     Question:  Specimen collection method  Answer:  Lab=Lab collect   02/07/21 0847   02/08/21 0500  Comprehensive metabolic panel  Daily,   R     Question:  Specimen collection method  Answer:  Lab=Lab collect   02/07/21 0847         Culture/Microbiology    Component Value Date/Time   SDES BLOOD LEFT HAND 02/04/2021 1437   SPECREQUEST  02/04/2021 1437    BOTTLES DRAWN AEROBIC ONLY Blood Culture results may not be optimal due to an inadequate volume of blood received in culture bottles   CULT  02/04/2021 1437    NO GROWTH 3  DAYS Performed at [facility], [address], [place], [place] [address]    REPTSTATUS PENDING 02/04/2021 1437    Other culture-see note  Medications: Scheduled Meds: . amoxicillin-clavulanate  1 tablet Oral Q12H  . Chlorhexidine Gluconate Cloth  6 each Topical Daily  . famotidine  10 mg Oral Daily  . heparin  5,000 Units Subcutaneous Q8H  . metoprolol tartrate  12.5 mg Oral BID  . polyethylene glycol  17 g Oral Daily  . senna-docusate  1 tablet Oral BID   Continuous Infusions: . sodium chloride 150 mL/hr at 02/06/21 0644  . sodium chloride 10 mL/hr at 02/05/21 1100    Antimicrobials: Anti-infectives (From admission, onward)   Start     Dose/Rate Route Frequency Ordered Stop   02/05/21 1245  amoxicillin-clavulanate (AUGMENTIN) 875-125 MG per tablet 1 tablet        1 tablet Oral Every 12 hours 02/05/21 1157      02/05/21 0800  ceFEPIme (MAXIPIME) 2 g in sodium chloride 0.9 % 100 mL IVPB  Status:  Discontinued        2 g 200 mL/hr over 30 Minutes Intravenous Every 24 hours 02/04/21 0813 02/05/21 1157   02/04/21 0812  vancomycin variable dose per unstable renal function (pharmacist dosing)  Status:  Discontinued         Does not apply See admin instructions 02/04/21 0812 02/05/21 1157   02/04/21 0730  ceFEPIme (MAXIPIME) 2 g in sodium chloride 0.9 % 100 mL IVPB        2 g 200 mL/hr over 30 Minutes Intravenous  Once 02/04/21 0725 02/04/21 0842   02/04/21 0730  metroNIDAZOLE (FLAGYL) IVPB 500 mg        500 mg 100 mL/hr over 60 Minutes Intravenous  Once 02/04/21 0725 02/04/21 0932   02/04/21 0730  vancomycin (VANCOREADY) IVPB 2000 mg/400 mL        2,000 mg 200 mL/hr over 120 Minutes Intravenous  Once 02/04/21 0725 02/04/21 1122     Objective: Vitals: Today's Vitals   02/07/21 0448 02/07/21 0450 02/07/21 0753 02/07/21 0809  BP:    114/72  Pulse:    79  Resp:    16  Temp:    97.6 F (36.4 C)  TempSrc:    Oral  SpO2:      Weight: 105.6 kg     Height:      PainSc:  Asleep Asleep     Intake/Output Summary (Last 24 hours) at 02/07/2021 1111 Last data filed at 02/07/2021 0900 Gross per 24 hour  Intake 150 ml  Output 7400 ml  Net -7250 ml   Filed Weights   02/04/21 1500 02/05/21 0500 02/07/21 0448  Weight: 104 kg 104 kg 105.6 kg   Weight change:   Intake/Output from previous day: 02/20 0701 - 02/21 0700 In: -  Out: 7450 [Urine:7450] Intake/Output this shift: Total I/O In: 150 [P.O.:150] Out: 750 [Urine:750] Filed Weights   02/04/21 1500 02/05/21 0500 02/07/21 0448  Weight: 104 kg 104 kg 105.6 kg    Examination: General exam: AAOx3, obese,NAD, weak appearing. HEENT:Oral mucosa moist, Ear/Nose WNL grossly,dentition normal. Respiratory system: bilaterally clear,no wheezing or crackles,no use of accessory muscle, non tender. Cardiovascular system: S1 & S2 +, regular, No  JVD. Gastrointestinal system: Abdomen soft, NT,ND, BS+. Nervous System:Alert, awake, moving extremities and grossly nonfocal Extremities: unable to move LLE, abel to feel,\ low back pain with mild tenderness, distal peripheral pulses palpable. Tender lower back Skin: No rashes,no icterus.  MSK: Normal muscle bulk,tone, power   Data Reviewed: I have personally reviewed following labs and imaging studies CBC: Recent Labs  Lab 02/04/21 0617 02/04/21 0630 02/04/21 0924 02/04/21 0928 02/05/21 1453 02/06/21 0219 02/07/21 0126  WBC 15.0*  --   --   --  13.1* 13.6* 13.7*  NEUTROABS 11.7*  --   --   --   --   --  10.8*  HGB 17.0   < > 18.0* 18.0* 16.7 15.2 13.6  HCT 54.3*   < > 53.0* 53.0* 48.0 45.0 41.1  MCV 105.6*  --   --   --  97.0 99.1 100.5*  PLT 207  --   --   --  135* 135* 126*   < > = values in this interval not displayed.   Basic Metabolic Panel: Recent Labs  Lab 02/04/21 1202 02/04/21 2139 02/05/21 0346 02/05/21 1453 02/06/21 0219 02/07/21 0126  NA 142 136 135 134* 132* 135  K 6.3* 5.0 4.0 3.9 3.9 3.7  CL 107 103 95* 92* 95* 98  CO2 15* 22 26 31 26 29   GLUCOSE 87 132* 135* 136* 124* 115*  BUN 20 23* 25* 24* 24* 19  CREATININE 3.22* 2.39* 2.16* 1.85* 1.68* 1.30*  CALCIUM 8.3* 8.0* 7.8* 7.8* 8.0* 8.2*  MG 2.5*  --  2.1  --   --   --   PHOS 5.1*  --  4.8*  --   --   --    GFR: Estimated Creatinine Clearance: 79 mL/min (A) (by C-G formula based on SCr of 1.3 mg/dL (H)). Liver Function Tests: Recent Labs  Lab 02/04/21 1202 02/04/21 2139 02/05/21 1453 02/06/21 0219 02/07/21 0126  AST 724* 1,669* 1,517* 1,243* 938*  ALT 257* 525* 526* 428* 335*  ALKPHOS 77 72 60 50 50  BILITOT 0.5 1.0 1.1 1.1 1.0  PROT 7.5 6.4* 6.6 6.0* 6.0*  ALBUMIN 3.8 3.0* 2.8* 2.6* 2.5*   Recent Labs  Lab 02/04/21 1150  LIPASE 30  AMYLASE 356*   Recent Labs  Lab 02/04/21 0617  AMMONIA 42*   Coagulation Profile: Recent Labs  Lab 02/04/21 0724  INR 1.2   Cardiac  Enzymes: Recent Labs  Lab 02/04/21 0848 02/04/21 1150 02/05/21 1453 02/06/21 0219 02/07/21 0126  CKTOTAL 6,758* >50,000* >50,000* >50,000* >50,000*  CKMB  --   --  178.4*  --   --    BNP (last 3 results) No results for input(s): PROBNP in the last 8760 hours. HbA1C: No results for input(s): HGBA1C in the last 72 hours. CBG: Recent Labs  Lab 02/04/21 1337 02/04/21 1935 02/04/21 2332 02/05/21 0323 02/06/21 0635  GLUCAP 109* 138* 132* 125* 124*   Lipid Profile: No results for input(s): CHOL, HDL, LDLCALC, TRIG, CHOLHDL, LDLDIRECT in the last 72 hours. Thyroid Function Tests: No results for input(s): TSH, T4TOTAL, FREET4, T3FREE, THYROIDAB in the last 72 hours. Anemia Panel: No results for input(s): VITAMINB12, FOLATE, FERRITIN, TIBC, IRON, RETICCTPCT in the last 72 hours. Sepsis Labs: Recent Labs  Lab 02/04/21 0915 02/04/21 1150 02/04/21 1437 02/04/21 2139 02/05/21 1453  PROCALCITON  --  8.88  --   --   --   LATICACIDVEN 6.3*  --  2.7* 2.5* 1.5    Recent Results (from the past 240 hour(s))  Culture, blood (Routine X 2) w Reflex to ID Panel     Status: None (Preliminary result)   Collection Time: 02/04/21  6:50 AM   Specimen: BLOOD  Result Value Ref Range Status  Specimen Description BLOOD RIGHT ANTECUBITAL  Final   Special Requests   Final    BOTTLES DRAWN AEROBIC ONLY Blood Culture results may not be optimal due to an inadequate volume of blood received in culture bottles   Culture   Final    NO GROWTH 3 DAYS Performed at [facility], [address], [place], [place] [address]    Report Status PENDING  Incomplete  Culture, blood (Routine X 2) w Reflex to ID Panel     Status: None (Preliminary result)   Collection Time: 02/04/21  7:55 AM   Specimen: BLOOD  Result Value Ref Range Status   Specimen Description BLOOD LEFT ANTECUBITAL  Final   Special Requests   Final    BOTTLES DRAWN AEROBIC AND ANAEROBIC Blood Culture results may not be optimal due to  an inadequate volume of blood received in culture bottles   Culture   Final    NO GROWTH 3 DAYS Performed at [facility], [address] [place], [place] [address]    Report Status PENDING  Incomplete  Urine Culture     Status: None   Collection Time: 02/04/21  8:25 AM   Specimen: Urine, Random  Result Value Ref Range Status   Specimen Description URINE, RANDOM  Final   Special Requests NONE  Final   Culture   Final    NO GROWTH Performed at [facility], [address], [place], [place] [address]    Report Status 02/05/2021 FINAL  Final  Resp Panel by RT-PCR (Flu A&B, Covid) Nasopharyngeal Swab     Status: None   Collection Time: 02/04/21  9:25 AM   Specimen: Nasopharyngeal Swab; Nasopharyngeal(NP) swabs in vial transport medium  Result Value Ref Range Status   SARS Coronavirus 2 by RT PCR NEGATIVE NEGATIVE Final    Comment: (NOTE) SARS-CoV-2 target nucleic acids are NOT DETECTED.  The SARS-CoV-2 RNA is generally detectable in upper respiratory specimens during the acute phase of infection. The lowest concentration of SARS-CoV-2 viral copies this assay can detect is 138 copies/mL. A negative result does not preclude SARS-Cov-2 infection and should not be used as the sole basis for treatment or other patient management decisions. A negative result may occur with  improper specimen collection/handling, submission of specimen other than nasopharyngeal swab, presence of viral mutation(s) within the areas targeted by this assay, and inadequate number of viral copies(<138 copies/mL). A negative result must be combined with clinical observations, patient history, and epidemiological information. The expected result is Negative.  Fact Sheet for Patients:  BloggerCourse.com  Fact Sheet for Healthcare Providers:  SeriousBroker.it  This test is no t yet approved or cleared by the [place] FDA and  has been  authorized for detection and/or diagnosis of SARS-CoV-2 by FDA under an Emergency Use Authorization (EUA). This EUA will remain  in effect (meaning this test can be used) for the duration of the COVID-19 declaration under Section 564(b)(1) of the Act, 21 U.S.C.section 360bbb-3(b)(1), unless the authorization is terminated  or revoked sooner.       Influenza A by PCR NEGATIVE NEGATIVE Final   Influenza B by PCR NEGATIVE NEGATIVE Final    Comment: (NOTE) The Xpert Xpress SARS-CoV-2/FLU/RSV plus assay is intended as an aid in the diagnosis of influenza from Nasopharyngeal swab specimens and should not be used as a sole basis for treatment. Nasal washings and aspirates are unacceptable for Xpert Xpress SARS-CoV-2/FLU/RSV testing.  Fact Sheet for Patients: BloggerCourse.com  Fact Sheet for Healthcare Providers: SeriousBroker.it  This test is not yet approved or cleared by the [place] FDA and has been authorized for detection and/or diagnosis of SARS-CoV-2 by FDA under an Emergency Use Authorization (EUA). This EUA will remain in effect (meaning this test can be used) for the duration of the COVID-19 declaration under Section 564(b)(1) of the Act, 21 U.S.C. section 360bbb-3(b)(1), unless the authorization is terminated or revoked.  Performed at [facility], [address], [place], [place] [address]   MRSA PCR Screening     Status: None   Collection Time: 02/04/21  1:37 PM   Specimen: Nasal Mucosa; Nasopharyngeal  Result Value Ref Range Status   MRSA by PCR NEGATIVE NEGATIVE Final    Comment:        The GeneXpert MRSA Assay (FDA approved for NASAL specimens only), is one component of a comprehensive MRSA colonization surveillance program. It is not intended to diagnose MRSA infection nor to guide or monitor treatment for MRSA infections. Performed at [facility], [address], [place], [place]  [address]   Blood Culture (routine x 2)     Status: None (Preliminary result)   Collection Time: 02/04/21  2:22 PM   Specimen: BLOOD RIGHT HAND  Result Value Ref Range Status   Specimen Description BLOOD RIGHT HAND  Final   Special Requests   Final    BOTTLES DRAWN AEROBIC ONLY Blood Culture results may not be optimal due to an inadequate volume of blood received in culture bottles   Culture   Final    NO GROWTH 3 DAYS Performed at [facility], [address], [place], [place] [address]    Report Status PENDING  Incomplete  Blood Culture (routine x 2)     Status: None (Preliminary result)   Collection Time: 02/04/21  2:37 PM   Specimen: BLOOD LEFT HAND  Result Value Ref Range Status   Specimen Description BLOOD LEFT HAND  Final   Special Requests   Final    BOTTLES DRAWN AEROBIC ONLY Blood Culture results may not be optimal due to an inadequate volume of blood received in culture bottles   Culture   Final    NO GROWTH 3 DAYS Performed at [facility], [address], [place], [place] [address]    Report Status PENDING  Incomplete     Radiology Studies: DG Abd 1 View  Result Date: 02/06/2021 CLINICAL DATA:  Abdominal pain and distention. EXAM: ABDOMEN - 1 VIEW COMPARISON:  02/04/2021 FINDINGS: The previously demonstrated enteric tube appears to have been removed. There is nonspecific gaseous distension the:. There is no significant evidence for a small bowel obstruction. There are retrocardiac airspace opacities suggestive of atelectasis. There is no pneumatosis or free air. There is a relative paucity of bowel gas at the level of the rectum. IMPRESSION: Nonspecific gaseous distension of the colon. Electronically Signed   By: Katherine Mantle M.D.   On: 02/06/2021 15:28   VAS [place] LOWER EXTREMITY VENOUS (DVT)  Result Date: 02/06/2021  Lower Venous DVT Study Indications: Edema.  Risk Factors: None identified. Limitations: Poor ultrasound/tissue interface and body habitus.  Comparison Study: No prior studies. Performing Technologist: Chanda Busing RVT  Examination Guidelines: A complete evaluation includes B-mode imaging, spectral Doppler, color Doppler, and power Doppler as needed of all accessible portions of each vessel. Bilateral testing is considered an integral part of a complete examination. Limited examinations for reoccurring indications may be performed  as noted. The reflux portion of the exam is performed with the patient in reverse Trendelenburg.  +---------+---------------+---------+-----------+----------+--------------+ RIGHT    CompressibilityPhasicitySpontaneityPropertiesThrombus Aging +---------+---------------+---------+-----------+----------+--------------+ CFV      Full           Yes      Yes                                 +---------+---------------+---------+-----------+----------+--------------+ SFJ      Full                                                        +---------+---------------+---------+-----------+----------+--------------+ FV Prox  Full                                                        +---------+---------------+---------+-----------+----------+--------------+ FV Mid   Full                                                        +---------+---------------+---------+-----------+----------+--------------+ FV DistalFull                                                        +---------+---------------+---------+-----------+----------+--------------+ PFV      Full                                                        +---------+---------------+---------+-----------+----------+--------------+ POP      Full           Yes      Yes                                 +---------+---------------+---------+-----------+----------+--------------+ PTV      Full                                                        +---------+---------------+---------+-----------+----------+--------------+ PERO      Full                                                        +---------+---------------+---------+-----------+----------+--------------+   +---------+---------------+---------+-----------+----------+--------------+ LEFT     CompressibilityPhasicitySpontaneityPropertiesThrombus Aging +---------+---------------+---------+-----------+----------+--------------+ CFV      Full           Yes  Yes                                 +---------+---------------+---------+-----------+----------+--------------+ SFJ      Full                                                        +---------+---------------+---------+-----------+----------+--------------+ FV Prox  Full                                                        +---------+---------------+---------+-----------+----------+--------------+ FV Mid   Full                                                        +---------+---------------+---------+-----------+----------+--------------+ FV DistalFull                                                        +---------+---------------+---------+-----------+----------+--------------+ PFV      Full                                                        +---------+---------------+---------+-----------+----------+--------------+ POP      Full           Yes      Yes                                 +---------+---------------+---------+-----------+----------+--------------+ PTV      Full                                                        +---------+---------------+---------+-----------+----------+--------------+ PERO     Full                                                        +---------+---------------+---------+-----------+----------+--------------+     Summary: RIGHT: - There is no evidence of deep vein thrombosis in the lower extremity. However, portions of this examination were limited- see technologist comments above.  - No cystic structure found in  the popliteal fossa.  LEFT: - There is no evidence of deep vein thrombosis in the lower extremity. However, portions of this examination were limited- see technologist comments above.  - No cystic structure found in the popliteal fossa.  *See  table(s) above for measurements and observations.    Preliminary      LOS: 3 days   Shane Boastamesh Joselle Deeds, MD [facility]  02/07/2021, 11:11 AM

## 2021-02-07 NOTE — Progress Notes (Signed)
Physical Therapy Treatment Patient Details Name: Shane Guzman MRN: [id number] DOB: 16-Nov-1976 Today's Date: 02/07/2021    History of Present Illness 45 year old man with obesity, hypertension, history of sigmoid diverticulitis (perforation 2012), OSA on CPAP with marginal compliance, tobacco and alcohol use.  He was brought to the ED 2/18 after being unresponsive and difficult to arouse in the early morning. Pt with pinpoint pupils, agonal breathing and foaming at the mouth, EMS with brief stent of CPR, came too with narcan. drug screen was negative.    PT Comments    Pt continues with L LE swelling especially at hip, L LE numbness, however improved some from yestserday, L drop foot, and very painful and distended abdomen greatly limited mobility. Pt was able to stand and attempt side stepping along EOB today with modAx2. Recommend a PRAFO for in bed to promote neutral position and minimize risk of heel cord contracture. RN notified. Aware Dr. Charlann Boxer wrote for AFO however pt unable to WB on L LE and LE is swollen not sure that and AFO is appropriate at this time. Acute PT to cont to follow.    Follow Up Recommendations  CIR     Equipment Recommendations       Recommendations for Other Services Rehab consult     Precautions / Restrictions Precautions Precautions: Fall Precaution Comments: L LE very swollen and numb--ortho saw and does not suspect compartment syndrome Restrictions Weight Bearing Restrictions: No Other Position/Activity Restrictions: pt self L LE NWB due to no feeling and L hip pain    Mobility  Bed Mobility Overal bed mobility: Needs Assistance Bed Mobility: Rolling;Sidelying to Sit;Sit to Sidelying Rolling: Min assist Sidelying to sit: Mod assist;HOB elevated Supine to sit: Mod assist;+2 for physical assistance;HOB elevated     General bed mobility comments: pt with distended abdomen and pain requiring increased assist today from PT to transfer in/out of bed,  modA for L LE management as well    Transfers Overall transfer level: Needs assistance Equipment used: Rolling walker (2 wheeled) Transfers: Sit to/from Stand Sit to Stand: Mod assist;+2 physical assistance;+2 safety/equipment         General transfer comment: pt completed 2 sit to stand trials with L knee blocked, pt very dependent on UEs on RW. L knee blocked, able to complete knee extension with verbal cues however without proprioception pt unable to tell when he needs to activate his L quad to do so, limited L LE WBing tolerance  Ambulation/Gait             General Gait Details: attempted to side step to [facility], pt essential made self L LE NWB and hopped on R Foot, minimal foot clearance, incresaed pain and quickly fatigued   Stairs             Wheelchair Mobility    Modified Rankin (Stroke Patients Only)       Balance Overall balance assessment: Needs assistance Sitting-balance support: Feet supported;Bilateral upper extremity supported Sitting balance-Leahy Scale: Fair Sitting balance - Comments: reliant on UEs to off set back pain       Standing balance comment: completely dependent on RW via UEs                            Cognition Arousal/Alertness: Awake/alert Behavior During Therapy: WFL for tasks assessed/performed Overall Cognitive Status: Within Functional Limits for tasks assessed  General Comments: pt receiving phentyl every 2 hours making pt sleepy, however pt easily wakened and was ablet o participate in PT despite 10/10 pain      Exercises General Exercises - Lower Extremity Ankle Circles/Pumps: 10 reps;Supine;PROM;Left (passive stretch past neutral) Quad Sets: AROM;Left;10 reps;Supine Gluteal Sets: AROM;Both;10 reps;Supine Long Arc Quad: AROM;Left;10 reps;Seated    General Comments General comments (skin integrity, edema, etc.): L LE remains extremely swollen especially at hip  and thigh, L foot drop      Pertinent Vitals/Pain Pain Assessment: 0-10 Pain Score: 10-Worst pain ever Pain Location: abdomen and L hip Pain Descriptors / Indicators: Grimacing;Guarding Pain Intervention(s): Premedicated before session;Patient requesting pain meds-RN notified    Home Living                      Prior Function            PT Goals (current goals can now be found in the care plan section) Acute Rehab PT Goals Patient Stated Goal: stop the abdominal and L hip pain Progress towards PT goals: Progressing toward goals    Frequency    Min 4X/week      PT Plan Current plan remains appropriate    Co-evaluation              AM-PAC PT "6 Clicks" Mobility   Outcome Measure  Help needed turning from your back to your side while in a flat bed without using bedrails?: A Little Help needed moving from lying on your back to sitting on the side of a flat bed without using bedrails?: A Lot Help needed moving to and from a bed to a chair (including a wheelchair)?: A Lot Help needed standing up from a chair using your arms (e.g., wheelchair or bedside chair)?: A Lot Help needed to walk in hospital room?: Total Help needed climbing 3-5 steps with a railing? : Total 6 Click Score: 11    End of Session Equipment Utilized During Treatment: Gait belt Activity Tolerance: Patient limited by pain Patient left: in bed;with call bell/phone within reach;with bed alarm set Nurse Communication: Mobility status PT Visit Diagnosis: Unsteadiness on feet (R26.81);Muscle weakness (generalized) (M62.81);Difficulty in walking, not elsewhere classified (R26.2)     Time: 2979-8921 PT Time Calculation (min) (ACUTE ONLY): 24 min  Charges:  $Therapeutic Exercise: 8-22 mins $Therapeutic Activity: 8-22 mins                     Lewis Shock, PT, DPT Acute Rehabilitation Services Pager #: [phone] Office #: [phone]    Iona Hansen 02/07/2021, 12:06 PM

## 2021-02-07 NOTE — Progress Notes (Addendum)
Patient ID: Shane Guzman, male   DOB: 1976-03-21, 45 y.o.   MRN: [id number] [facility] Progress Note   Assessment/ Plan:   1.  Acute kidney injury: Likely from a combination of volume contraction and pigment nephropathy from rhabdomyolysis.  Remains on intravenous fluids, good UOP.  Creat down to 1.3 today, indicating recovery of ATN. CPK remains > 50,000. AST/ALT are coming down though. Can lower IVF"s to 75 cc/hr and dc IVF"s when CPK's are coming down (e.g., < 25K or so). Cont to avoid ARB/ ACEi/ entresto for 3-4 weeks.  Will be available as needed. Will sign off.   2.  Rhabdomyolysis: Evidenced by physical exam-likely area affected is the left hip. No compartment syndrome per ortho evaluation. Suspected peroneal nerve injury.  3.  Hyperkalemia: Secondary to acute kidney injury and rhabdomyolysis, corrected with Lokelma and bicarbonate drip. 4.  Anion gap metabolic acidosis: Secondary to acute kidney injury and lactic acidosis- corrected with sodium bicarbonate drip/volume expansion. 5.  Hypertension:  ARB/diuretics on hold in the setting of acute kidney injury and efforts at volume expansion.  Started on low-dose metoprolol yesterday for blood pressure control.  Vinson Moselle, MD 02/07/2021, 12:41 PM   Subjective:   Continues to have persistent left thigh/hip pain and leg numbness.   Objective:   BP (!) 128/92 (BP Location: Left Arm)   Pulse 75   Temp 98.2 F (36.8 C) (Oral)   Resp 16   Ht 5\' 4"  (1.626 m)   Wt 105.6 kg   SpO2 95%   BMI 39.96 kg/m   Intake/Output Summary (Last 24 hours) at 02/07/2021 1239 Last data filed at 02/07/2021 0900 Gross per 24 hour  Intake 150 ml  Output 5300 ml  Net -5150 ml   Weight change:   Physical Exam: Gen: Appears comfortable resting in bed CVS: Pulse regular rhythm, normal rate, S1-S2 normal Resp: Clear to auscultation, no rales/rhonchi.  NGT in situ. Abd: Firm, moderately distended, nontender to exam.  Bowel sounds  distant Ext: Trace lower extremity edema.  Focal swelling/firm area over left lateral hip/thigh  Imaging: DG Abd 1 View  Result Date: 02/06/2021 CLINICAL DATA:  Abdominal pain and distention. EXAM: ABDOMEN - 1 VIEW COMPARISON:  02/04/2021 FINDINGS: The previously demonstrated enteric tube appears to have been removed. There is nonspecific gaseous distension the:. There is no significant evidence for a small bowel obstruction. There are retrocardiac airspace opacities suggestive of atelectasis. There is no pneumatosis or free air. There is a relative paucity of bowel gas at the level of the rectum. IMPRESSION: Nonspecific gaseous distension of the colon. Electronically Signed   By: 02/06/2021 M.D.   On: 02/06/2021 15:28   VAS 02/08/2021 LOWER EXTREMITY VENOUS (DVT)  Result Date: 02/06/2021  Lower Venous DVT Study Indications: Edema.  Risk Factors: None identified. Limitations: Poor ultrasound/tissue interface and body habitus. Comparison Study: No prior studies. Performing Technologist: 02/08/2021 RVT  Examination Guidelines: A complete evaluation includes B-mode imaging, spectral Doppler, color Doppler, and power Doppler as needed of all accessible portions of each vessel. Bilateral testing is considered an integral part of a complete examination. Limited examinations for reoccurring indications may be performed as noted. The reflux portion of the exam is performed with the patient in reverse Trendelenburg.  +---------+---------------+---------+-----------+----------+--------------+ RIGHT    CompressibilityPhasicitySpontaneityPropertiesThrombus Aging +---------+---------------+---------+-----------+----------+--------------+ CFV      Full           Yes      Yes                                 +---------+---------------+---------+-----------+----------+--------------+  SFJ      Full                                                         +---------+---------------+---------+-----------+----------+--------------+ FV Prox  Full                                                        +---------+---------------+---------+-----------+----------+--------------+ FV Mid   Full                                                        +---------+---------------+---------+-----------+----------+--------------+ FV DistalFull                                                        +---------+---------------+---------+-----------+----------+--------------+ PFV      Full                                                        +---------+---------------+---------+-----------+----------+--------------+ POP      Full           Yes      Yes                                 +---------+---------------+---------+-----------+----------+--------------+ PTV      Full                                                        +---------+---------------+---------+-----------+----------+--------------+ PERO     Full                                                        +---------+---------------+---------+-----------+----------+--------------+   +---------+---------------+---------+-----------+----------+--------------+ LEFT     CompressibilityPhasicitySpontaneityPropertiesThrombus Aging +---------+---------------+---------+-----------+----------+--------------+ CFV      Full           Yes      Yes                                 +---------+---------------+---------+-----------+----------+--------------+ SFJ      Full                                                        +---------+---------------+---------+-----------+----------+--------------+  FV Prox  Full                                                        +---------+---------------+---------+-----------+----------+--------------+ FV Mid   Full                                                         +---------+---------------+---------+-----------+----------+--------------+ FV DistalFull                                                        +---------+---------------+---------+-----------+----------+--------------+ PFV      Full                                                        +---------+---------------+---------+-----------+----------+--------------+ POP      Full           Yes      Yes                                 +---------+---------------+---------+-----------+----------+--------------+ PTV      Full                                                        +---------+---------------+---------+-----------+----------+--------------+ PERO     Full                                                        +---------+---------------+---------+-----------+----------+--------------+     Summary: RIGHT: - There is no evidence of deep vein thrombosis in the lower extremity. However, portions of this examination were limited- see technologist comments above.  - No cystic structure found in the popliteal fossa.  LEFT: - There is no evidence of deep vein thrombosis in the lower extremity. However, portions of this examination were limited- see technologist comments above.  - No cystic structure found in the popliteal fossa.  *See table(s) above for measurements and observations.    Preliminary     Labs: BMET Recent Labs  Lab 02/04/21 0617 02/04/21 0630 02/04/21 4782 02/04/21 1202 02/04/21 2139 02/05/21 0346 02/05/21 1453 02/06/21 0219 02/07/21 0126  NA 140   < > 142 142 136 135 134* 132* 135  K 5.2*   < > 6.5* 6.3* 5.0 4.0 3.9 3.9 3.7  CL 103  --  110 107 103 95* 92* 95* 98  CO2 18*  --   --  15* 22 26 31 26 29   GLUCOSE 131*  --  87 87 132* 135* 136* 124* 115*  BUN 18  --  29* 20 23* 25* 24* 24* 19  CREATININE 3.46*  --  3.30* 3.22* 2.39* 2.16* 1.85* 1.68* 1.30*  CALCIUM 9.0  --   --  8.3* 8.0* 7.8* 7.8* 8.0* 8.2*  PHOS  --   --   --  5.1*  --  4.8*  --    --   --    < > = values in this interval not displayed.   CBC Recent Labs  Lab 02/04/21 0617 02/04/21 0630 02/04/21 0928 02/05/21 1453 02/06/21 0219 02/07/21 0126  WBC 15.0*  --   --  13.1* 13.6* 13.7*  NEUTROABS 11.7*  --   --   --   --  10.8*  HGB 17.0   < > 18.0* 16.7 15.2 13.6  HCT 54.3*   < > 53.0* 48.0 45.0 41.1  MCV 105.6*  --   --  97.0 99.1 100.5*  PLT 207  --   --  135* 135* 126*   < > = values in this interval not displayed.    Medications:    . amoxicillin-clavulanate  1 tablet Oral Q12H  . Chlorhexidine Gluconate Cloth  6 each Topical Daily  . famotidine  10 mg Oral Daily  . heparin  5,000 Units Subcutaneous Q8H  . metoprolol tartrate  12.5 mg Oral BID  . polyethylene glycol  17 g Oral Daily  . senna-docusate  1 tablet Oral BID

## 2021-02-07 NOTE — Progress Notes (Signed)
Orthopedic Tech Progress Note Patient Details:  TRYGG MANTZ 07-26-1976 [id number] Called in order to [facility] for an AFO  Patient ID: GUY SEESE, male   DOB: [birth year], 45 y.o.   MRN: [id number]   [name] 02/07/2021, 8:15 AM

## 2021-02-08 DIAGNOSIS — N179 Acute kidney failure, unspecified: Secondary | ICD-10-CM | POA: Diagnosis not present

## 2021-02-08 LAB — CBC
HCT: 39.1 % (ref 39.0–52.0)
Hemoglobin: 13.3 g/dL (ref 13.0–17.0)
MCH: 34.2 pg — ABNORMAL HIGH (ref 26.0–34.0)
MCHC: 34 g/dL (ref 30.0–36.0)
MCV: 100.5 fL — ABNORMAL HIGH (ref 80.0–100.0)
Platelets: 138 10*3/uL — ABNORMAL LOW (ref 150–400)
RBC: 3.89 MIL/uL — ABNORMAL LOW (ref 4.22–5.81)
RDW: 11.5 % (ref 11.5–15.5)
WBC: 12.4 10*3/uL — ABNORMAL HIGH (ref 4.0–10.5)
nRBC: 0 % (ref 0.0–0.2)

## 2021-02-08 LAB — COMPREHENSIVE METABOLIC PANEL
ALT: 292 U/L — ABNORMAL HIGH (ref 0–44)
AST: 783 U/L — ABNORMAL HIGH (ref 15–41)
Albumin: 2.4 g/dL — ABNORMAL LOW (ref 3.5–5.0)
Alkaline Phosphatase: 54 U/L (ref 38–126)
Anion gap: 9 (ref 5–15)
BUN: 16 mg/dL (ref 6–20)
CO2: 25 mmol/L (ref 22–32)
Calcium: 8.2 mg/dL — ABNORMAL LOW (ref 8.9–10.3)
Chloride: 100 mmol/L (ref 98–111)
Creatinine, Ser: 1.18 mg/dL (ref 0.61–1.24)
GFR, Estimated: >60 mL/min (ref >60)
Glucose, Bld: 141 mg/dL — ABNORMAL HIGH (ref 70–99)
Potassium: 3.3 mmol/L — ABNORMAL LOW (ref 3.5–5.1)
Sodium: 134 mmol/L — ABNORMAL LOW (ref 135–145)
Total Bilirubin: 0.9 mg/dL (ref 0.3–1.2)
Total Protein: 5.7 g/dL — ABNORMAL LOW (ref 6.5–8.1)

## 2021-02-08 LAB — CK: Total CK: 44314 U/L — ABNORMAL HIGH (ref 49–397)

## 2021-02-08 MED ORDER — POTASSIUM CHLORIDE CRYS ER 20 MEQ PO TBCR
40.0000 meq | EXTENDED_RELEASE_TABLET | Freq: Once | ORAL | Status: AC
  Administered 2021-02-08: 40 meq via ORAL
  Filled 2021-02-08: qty 2

## 2021-02-08 NOTE — Progress Notes (Signed)
Physical Therapy Treatment Patient Details Name: Shane Guzman MRN: [id number] DOB: 10-Aug-1976 Today's Date: 02/08/2021    History of Present Illness 45 year old man with obesity, hypertension, history of sigmoid diverticulitis (perforation 2012), OSA on CPAP with marginal compliance, tobacco and alcohol use.  He was brought to the ED 2/18 after being unresponsive and difficult to arouse in the early morning. Pt with pinpoint pupils, agonal breathing and foaming at the mouth, EMS with brief stent of CPR, came too with narcan. drug screen was negative.    PT Comments    Pt in bed with L foot elevated and PRAFO. PRAFO currently inhibiting pt ability to move L LE so removed and suggested use at night to reduce foot drop, however during day encouraged pt in movement of L LE to improve retrograde fluid flow with muscle activation. Started session with L LE AROM exercises. Pt is modA for bed mobility, and transfers and min A for stepping with RW from bed to recliner. Once in recliner pt educated in use of sheet to perform self passive stretch to Gastroc to decrease Achilles shortening to be performed throughout the day. Referred pt to mobility tech to progress movement. PT continues to recommend CIR level rehab. PT will continue to follow acutely.     Follow Up Recommendations  CIR        Recommendations for Other Services Rehab consult     Precautions / Restrictions Precautions Precautions: Fall Precaution Comments: L LE very swollen and numb--ortho saw and does not suspect compartment syndrome Restrictions Other Position/Activity Restrictions: pt self L LE NWB due to no feeling and L hip pain    Mobility  Bed Mobility Overal bed mobility: Needs Assistance Bed Mobility: Sit to Sidelying;Supine to Sit     Supine to sit: Mod assist;+2 for physical assistance;HOB elevated     General bed mobility comments: pt continues to have distended abdomen and pain requiring increased assist today  from PT to transfer in/out of bed, modA for L LE management as well    Transfers Overall transfer level: Needs assistance Equipment used: Rolling walker (2 wheeled) Transfers: Sit to/from Stand Sit to Stand: Mod assist;+2 physical assistance;+2 safety/equipment         General transfer comment: pt completed 2 sit to stand trials with L knee blocked, pt very dependent on UEs on RW. L knee blocked, able to complete knee extension with verbal cues however without proprioception pt unable to tell when he needs to activate his L quad to do so, limited L LE WBing tolerance  Ambulation/Gait Ambulation/Gait assistance: Min assist Gait Distance (Feet): 4 Feet Assistive device: Rolling walker (2 wheeled) Gait Pattern/deviations: Step-to pattern;Decreased stance time - left;Decreased dorsiflexion - left;Decreased weight shift to left;Antalgic Gait velocity: slowed Gait velocity interpretation: <1.31 ft/sec, indicative of household ambulator General Gait Details: pt able to step away from bed with heavy use of UE support on RW pt reports increased pain in L foot with weightbearing   Stairs             Wheelchair Mobility    Modified Rankin (Stroke Patients Only)       Balance Overall balance assessment: Needs assistance Sitting-balance support: Feet supported;Bilateral upper extremity supported Sitting balance-Leahy Scale: Fair       Standing balance-Leahy Scale: Poor Standing balance comment: requires heavy UE support on RW  Cognition Arousal/Alertness: Awake/alert Behavior During Therapy: WFL for tasks assessed/performed Overall Cognitive Status: Within Functional Limits for tasks assessed                                 General Comments: pt receiving phentyl every 2 hours making pt sleepy, however pt easily wakened and was ablet o participate in PT despite 10/10 pain      Exercises General Exercises - Lower  Extremity Ankle Circles/Pumps: 10 reps;Supine;PROM;Left (passive stretch past neutral with sheet) Heel Slides: AROM;Left;10 reps;Supine Other Exercises Other Exercises: in supine bent knee back twist x 10 AAROM    General Comments        Pertinent Vitals/Pain Pain Assessment: 0-10 Pain Score: 8  Pain Location: abdomen and L hip and foot Pain Descriptors / Indicators: Grimacing;Guarding;Pins and needles Pain Intervention(s): Limited activity within patient's tolerance;Monitored during session;Repositioned           PT Goals (current goals can now be found in the care plan section) Acute Rehab PT Goals Patient Stated Goal: stop the abdominal and L hip pain PT Goal Formulation: With patient Time For Goal Achievement: 02/20/21 Potential to Achieve Goals: Good Progress towards PT goals: Progressing toward goals    Frequency    Min 4X/week      PT Plan Current plan remains appropriate       AM-PAC PT "6 Clicks" Mobility   Outcome Measure  Help needed turning from your back to your side while in a flat bed without using bedrails?: A Little Help needed moving from lying on your back to sitting on the side of a flat bed without using bedrails?: A Lot Help needed moving to and from a bed to a chair (including a wheelchair)?: A Lot Help needed standing up from a chair using your arms (e.g., wheelchair or bedside chair)?: A Lot Help needed to walk in hospital room?: Total Help needed climbing 3-5 steps with a railing? : Total 6 Click Score: 11    End of Session Equipment Utilized During Treatment: Gait belt Activity Tolerance: Patient limited by pain Patient left: in bed;with call bell/phone within reach;with bed alarm set Nurse Communication: Mobility status PT Visit Diagnosis: Unsteadiness on feet (R26.81);Muscle weakness (generalized) (M62.81);Difficulty in walking, not elsewhere classified (R26.2)     Time: 3149-7026 PT Time Calculation (min) (ACUTE ONLY): 33  min  Charges:  $Gait Training: 8-22 mins $Therapeutic Exercise: 8-22 mins                     Elizabeth B. Beverely Risen PT, DPT Acute Rehabilitation Services Pager [phone] Office [phone]    [facility] 02/08/2021, 4:19 PM

## 2021-02-08 NOTE — Progress Notes (Signed)
PROGRESS NOTE    AB LEAMING  WJX:[id number] DOB: [birth year] DOA: 02/04/2021 PCP: Gaspar Garbe, MD   Chief Complaint  Patient presents with  . Altered Mental Status    Brief Narrative: 45 year old male with morbid obesity/OSA on CPAP with marginal compliance, tobacco and alcohol abuse, hypertension, chronic back pain, history of sigmoid diverticulitis with perforation in 2012 to the ED on 2/18 after being unresponsive and difficult to arouse in the early morning at home was diaphoretic with foaming of the mouth but no witnessed seizure activity.Apparently he has not been using CPAP.  Was brought by EMS, had a pulse with agonal respiratory pattern and hypoxia status post Narcan with some improvement in mental status with agitation then received Ativan on arrival to the ED and 6 L nasal cannula oxygen, was hypotensive with lactic acidosis 8.2, renal failure, metabolic and respiratory acidosis, internal positivity for drug screen was negative, etoh 84 lab with rhabdomyolysis leukocytosis transaminitis renal failure hyperkalemia acidosis. CT abdomen with mild bilateral posterior basilar subsegmental atelectasis versus infiltrate but no hydronephrosis, normal liver and pancreas with sigmoid diverticulosis with some focal wall thickening in the proximal sigmoid and minimal surrounding inflammatory change, negative CT head/CT C-spine. Empirically placed on vancomycin and cefepime for possible septic shock, with aggressive IV fluid hydration. Patient was admitted to critical care-treated for acute metabolic encephalopathy/hypoxic respiratory failure/rhabdomyolysis with severe AKI hyperkalemia metabolic acidosis.  Seen by nephrology. Patient has been also complaining of left lower leg weakness/foot drop and edema pain diminished sensation-seen by Dr. Jerrel Ivory orthopedics 2/20 no evidence of compartment syndrome blood flow likely from peroneal nerve compression advised nerve conduction study. 2/21-He  remembers hurting on the back ( can't' remember where else" remembers waking up in the hospital and since then c/o back pain and can't feel the left leg and cannot move it and it is hurting too.reports he has chronic back pain he sees which is unchanged. He is able to flex his left knee  Subjective:  Afebrile overnight. Below borderline low potassium creatinine improved, CK pending Leukocytosis/lfts no are downtrending Still not able to move left foot He remembers hurting on the back ( can't' remember where else" remembers waking up in the hospital and since then c/o back pain and can't feel the left leg and cannot move it and it is hurting too, has chronic back pain. he is able to flex his left knee Foley in place, no BM x 5 days but was not eating much he says   Assessment & Plan:  Acute kidney injury from combination of volume contraction/pigment nephropathy from rhabdomyolysis Hyperkalemia due to AKI Anion gap with a severe acidosis due to AKI Renal function has normalized.  Nephrology signed off.  On IV fluid hydration due to the rhabdomyolysis we will continue at lower rate. bicarb normalized.  Increase oral intake, discontinue Foley catheter.  Avoid entersto/aceARBs at least for 4 weeks. Recent Labs  Lab 02/05/21 0346 02/05/21 1453 02/06/21 0219 02/07/21 0126 02/08/21 0104  BUN 25* 24* 24* 19 16  CREATININE 2.16* 1.85* 1.68* 1.30* 1.18   Rhabdomyolysis-likely from prolonged immobilization, MRI back limited due to motion artifact but shows right paraspinous myositis. apparently his one leg was hanging from bed. CK > 59K- TREND, KEEP ON IVF.  mYOSITIS LESS LIKELY INFECTION-blood cx neg from 2/18.  Continue pain control  Hypokalemia- replete po  Left foot drop left peroneal nerve compression,no evidence of compartment syndrome seen by Dr. Chrissie Noa, orthopedic.  MRI lS spine limited but shows right-sided  paraspinous myositis.Advised AFO to support left foot until recovery and needs  NCS and f/u w/ Dr.  Ethelene Hal from orthopedic.  Acute hypoxic and hypercapnic respiratory failure with possible aspiration pneumonia/pneumonitis on admission: On admission needing nonrebreather did not require intubation. Remains on nasal cannula supplement and CPAP at bedtime.  Antibiotic has been changed to Augmentin for 5 days total.Blood culture urine culture no growth so far. Wbc downtrending, no fever.  Heavy alcohol abuse- he reports he drank 1 pint of liquor before going to bed on the night before admission. "I need to quit:. Counseled  Acute metabolic encephalopathy, suspect etoh induced, was found unresponsive at home.CT head unremarkable. Mental status improved patient does not have much recollection of what happened. He is aaox3. Monitor.  Abnormal LFTs/transaminitis due to rhabdomyolysis.  LFTs improving.Acute viral hepatitis panel was unremarkable. Recent Labs  Lab 02/04/21 0724 02/04/21 1202 02/04/21 2139 02/05/21 1453 02/06/21 0219 02/07/21 0126 02/08/21 0104  AST  --    < > 1,669* 1,517* 1,243* 938* 783*  ALT  --    < > 525* 526* 428* 335* 292*  ALKPHOS  --    < > 72 60 50 50 54  BILITOT  --    < > 1.0 1.1 1.1 1.0 0.9  PROT  --    < > 6.4* 6.6 6.0* 6.0* 5.7*  ALBUMIN  --    < > 3.0* 2.8* 2.6* 2.5* 2.4*  INR 1.2  --   --   --   --   --   --    < > = values in this interval not displayed.    Hypertension: BP stable, holding ARB/diuretics.Was placed on low-dose metoprolol 2/19.  Morbid Obesity w BMI > 39: OP follow-up and weight loss  OSA on CPAP with marginal compliance.  Continue bedtime CPAP.  On supplemental nasal cannula oxygen wean as tolerated.  Focal wall thickening incidentally noted on the CT abdomen on proximal sigmoid colon-uncertain if acute diverticulitis or sequelae of previous episode no abdominal pain.  Nutrition: Diet Order            Diet Heart Room service appropriate? Yes with Assist; Fluid consistency: Thin  Diet effective now                Pt's Body mass index is 39.96 kg/m.  DVT prophylaxis: heparin injection 5,000 Units Start: 02/04/21 1400 Code Status:   Code Status: Full Code  Family Communication: plan of care discussed with patient at bedside.  Status is: Inpatient Remains inpatient appropriate because:Ongoing diagnostic testing needed not appropriate for outpatient work up, IV treatments appropriate due to intensity of illness or inability to take PO and Inpatient level of care appropriate due to severity of illness  Dispo: The patient is from: apartment with girl friend and son              Anticipated d/c is to: CIR. Continue to work with PT OT              Anticipated d/c date is:2 days              Patient currently is not medically stable to d/c.   Difficult to place patient No  Consultants:see note  Procedures:see note  Unresulted Labs (From admission, onward)          Start     Ordered   02/08/21 0500  CK  Daily,   R     Question:  Specimen collection method  Answer:  Lab=Lab collect   02/07/21 0728   02/08/21 0500  CBC  Daily,   R     Question:  Specimen collection method  Answer:  Lab=Lab collect   02/07/21 0847   02/08/21 0500  Comprehensive metabolic panel  Daily,   R     Question:  Specimen collection method  Answer:  Lab=Lab collect   02/07/21 0847         Culture/Microbiology    Component Value Date/Time   SDES BLOOD LEFT HAND 02/04/2021 1437   SPECREQUEST  02/04/2021 1437    BOTTLES DRAWN AEROBIC ONLY Blood Culture results may not be optimal due to an inadequate volume of blood received in culture bottles   CULT  02/04/2021 1437    NO GROWTH 4 DAYS Performed at [facility], [address] [place], [place] [address]    REPTSTATUS PENDING 02/04/2021 1437    Other culture-see note  Medications: Scheduled Meds: . amoxicillin-clavulanate  1 tablet Oral Q12H  . Chlorhexidine Gluconate Cloth  6 each Topical Daily  . famotidine  10 mg Oral Daily  . heparin  5,000 Units  Subcutaneous Q8H  . metoprolol tartrate  12.5 mg Oral BID  . polyethylene glycol  17 g Oral Daily  . senna-docusate  1 tablet Oral BID   Continuous Infusions: . sodium chloride 150 mL/hr at 02/08/21 0241  . sodium chloride 10 mL/hr at 02/05/21 1100    Antimicrobials: Anti-infectives (From admission, onward)   Start     Dose/Rate Route Frequency Ordered Stop   02/05/21 1245  amoxicillin-clavulanate (AUGMENTIN) 875-125 MG per tablet 1 tablet        1 tablet Oral Every 12 hours 02/05/21 1157     02/05/21 0800  ceFEPIme (MAXIPIME) 2 g in sodium chloride 0.9 % 100 mL IVPB  Status:  Discontinued        2 g 200 mL/hr over 30 Minutes Intravenous Every 24 hours 02/04/21 0813 02/05/21 1157   02/04/21 0812  vancomycin variable dose per unstable renal function (pharmacist dosing)  Status:  Discontinued         Does not apply See admin instructions 02/04/21 0812 02/05/21 1157   02/04/21 0730  ceFEPIme (MAXIPIME) 2 g in sodium chloride 0.9 % 100 mL IVPB        2 g 200 mL/hr over 30 Minutes Intravenous  Once 02/04/21 0725 02/04/21 0842   02/04/21 0730  metroNIDAZOLE (FLAGYL) IVPB 500 mg        500 mg 100 mL/hr over 60 Minutes Intravenous  Once 02/04/21 0725 02/04/21 0932   02/04/21 0730  vancomycin (VANCOREADY) IVPB 2000 mg/400 mL        2,000 mg 200 mL/hr over 120 Minutes Intravenous  Once 02/04/21 0725 02/04/21 1122     Objective: Vitals: Today's Vitals   02/07/21 2350 02/08/21 0011 02/08/21 0432 02/08/21 0455  BP: (!) 131/57  (!) 151/101   Pulse: 79  100   Resp: 18  18   Temp: 97.7 F (36.5 C)  97.8 F (36.6 C)   TempSrc: Oral  Oral   SpO2: 95%  96%   Weight:      Height:      PainSc:  Asleep  Asleep    Intake/Output Summary (Last 24 hours) at 02/08/2021 0755 Last data filed at 02/08/2021 0438 Gross per 24 hour  Intake 150 ml  Output 1000 ml  Net -850 ml   Filed Weights   02/04/21 1500 02/05/21 0500 02/07/21 0448  Weight: 104 kg 104 kg 105.6 kg   Weight change:    Intake/Output from previous day: 02/21 0701 - 02/22 0700 In: 150 [P.O.:150] Out: 1750 [Urine:1750] Intake/Output this shift: No intake/output data recorded. Filed Weights   02/04/21 1500 02/05/21 0500 02/07/21 0448  Weight: 104 kg 104 kg 105.6 kg    Examination: General exam: AAOx3, obese,NAD, weak appearing. HEENT:Oral mucosa moist, Ear/Nose WNL grossly, dentition normal. Respiratory system: bilaterally clear,no wheezing or crackles,no use of accessory muscle Cardiovascular system: S1 & S2 +, No JVD,. Gastrointestinal system: Abdomen soft, NT,ND, BS+ Nervous System:Alert, awake, left foot drop, abel to move his left knee. Extremities: No edema, distal peripheral pulses palpable.  Skin: No rashes,no icterus. MSK: Normal muscle bulk,tone, power Tender on rt lower Paraspinous area.  Data Reviewed: I have personally reviewed following labs and imaging studies CBC: Recent Labs  Lab 02/04/21 0617 02/04/21 0630 02/04/21 0928 02/05/21 1453 02/06/21 0219 02/07/21 0126 02/08/21 0104  WBC 15.0*  --   --  13.1* 13.6* 13.7* 12.4*  NEUTROABS 11.7*  --   --   --   --  10.8*  --   HGB 17.0   < > 18.0* 16.7 15.2 13.6 13.3  HCT 54.3*   < > 53.0* 48.0 45.0 41.1 39.1  MCV 105.6*  --   --  97.0 99.1 100.5* 100.5*  PLT 207  --   --  135* 135* 126* 138*   < > = values in this interval not displayed.   Basic Metabolic Panel: Recent Labs  Lab 02/04/21 1202 02/04/21 2139 02/05/21 0346 02/05/21 1453 02/06/21 0219 02/07/21 0126 02/08/21 0104  NA 142   < > 135 134* 132* 135 134*  K 6.3*   < > 4.0 3.9 3.9 3.7 3.3*  CL 107   < > 95* 92* 95* 98 100  CO2 15*   < > GLUCOSE 87   < > 135* 136* 124* 115* 141*  BUN 20   < > 25* 24* 24* 19 16  CREATININE 3.22*   < > 2.16* 1.85* 1.68* 1.30* 1.18  CALCIUM 8.3*   < > 7.8* 7.8* 8.0* 8.2* 8.2*  MG 2.5*  --  2.1  --   --   --   --   PHOS 5.1*  --  4.8*  --   --   --   --    < > = values in this interval not displayed.    GFR: Estimated Creatinine Clearance: 87 mL/min (by C-G formula based on SCr of 1.18 mg/dL). Liver Function Tests: Recent Labs  Lab 02/04/21 2139 02/05/21 1453 02/06/21 0219 02/07/21 0126 02/08/21 0104  AST 1,669* 1,517* 1,243* 938* 783*  ALT 525* 526* 428* 335* 292*  ALKPHOS 72 60 50 50 54  BILITOT 1.0 1.1 1.1 1.0 0.9  PROT 6.4* 6.6 6.0* 6.0* 5.7*  ALBUMIN 3.0* 2.8* 2.6* 2.5* 2.4*   Recent Labs  Lab 02/04/21 1150  LIPASE 30  AMYLASE 356*   Recent Labs  Lab 02/04/21 0617  AMMONIA 42*   Coagulation Profile: Recent Labs  Lab 02/04/21 0724  INR 1.2   Cardiac Enzymes: Recent Labs  Lab 02/04/21 0848 02/04/21 1150 02/05/21 1453 02/06/21 0219 02/07/21 0126  CKTOTAL 6,758* >50,000* >50,000* >50,000* >50,000*  CKMB  --   --  178.4*  --   --    BNP (last 3 results) No results for input(s): PROBNP in the last 8760 hours. HbA1C: No results for input(s):  HGBA1C in the last 72 hours. CBG: Recent Labs  Lab 02/04/21 1337 02/04/21 1935 02/04/21 2332 02/05/21 0323 02/06/21 0635  GLUCAP 109* 138* 132* 125* 124*   Lipid Profile: No results for input(s): CHOL, HDL, LDLCALC, TRIG, CHOLHDL, LDLDIRECT in the last 72 hours. Thyroid Function Tests: No results for input(s): TSH, T4TOTAL, FREET4, T3FREE, THYROIDAB in the last 72 hours. Anemia Panel: No results for input(s): VITAMINB12, FOLATE, FERRITIN, TIBC, IRON, RETICCTPCT in the last 72 hours. Sepsis Labs: Recent Labs  Lab 02/04/21 0915 02/04/21 1150 02/04/21 1437 02/04/21 2139 02/05/21 1453  PROCALCITON  --  8.88  --   --   --   LATICACIDVEN 6.3*  --  2.7* 2.5* 1.5    Recent Results (from the past 240 hour(s))  Culture, blood (Routine X 2) w Reflex to ID Panel     Status: None (Preliminary result)   Collection Time: 02/04/21  6:50 AM   Specimen: BLOOD  Result Value Ref Range Status   Specimen Description BLOOD RIGHT ANTECUBITAL  Final   Special Requests   Final    BOTTLES DRAWN AEROBIC ONLY Blood  Culture results may not be optimal due to an inadequate volume of blood received in culture bottles   Culture   Final    NO GROWTH 4 DAYS Performed at [facility], [address], [place], [place] [address]    Report Status PENDING  Incomplete  Culture, blood (Routine X 2) w Reflex to ID Panel     Status: None (Preliminary result)   Collection Time: 02/04/21  7:55 AM   Specimen: BLOOD  Result Value Ref Range Status   Specimen Description BLOOD LEFT ANTECUBITAL  Final   Special Requests   Final    BOTTLES DRAWN AEROBIC AND ANAEROBIC Blood Culture results may not be optimal due to an inadequate volume of blood received in culture bottles   Culture   Final    NO GROWTH 4 DAYS Performed at [facility], [address], [place], [place] [address]    Report Status PENDING  Incomplete  Urine Culture     Status: None   Collection Time: 02/04/21  8:25 AM   Specimen: Urine, Random  Result Value Ref Range Status   Specimen Description URINE, RANDOM  Final   Special Requests NONE  Final   Culture   Final    NO GROWTH Performed at [facility], [address] [place], [place] [address]    Report Status 02/05/2021 FINAL  Final  Resp Panel by RT-PCR (Flu A&B, Covid) Nasopharyngeal Swab     Status: None   Collection Time: 02/04/21  9:25 AM   Specimen: Nasopharyngeal Swab; Nasopharyngeal(NP) swabs in vial transport medium  Result Value Ref Range Status   SARS Coronavirus 2 by RT PCR NEGATIVE NEGATIVE Final    Comment: (NOTE) SARS-CoV-2 target nucleic acids are NOT DETECTED.  The SARS-CoV-2 RNA is generally detectable in upper respiratory specimens during the acute phase of infection. The lowest concentration of SARS-CoV-2 viral copies this assay can detect is 138 copies/mL. A negative result does not preclude SARS-Cov-2 infection and should not be used as the sole basis for treatment or other patient management decisions. A negative result may occur with  improper  specimen collection/handling, submission of specimen other than nasopharyngeal swab, presence of viral mutation(s) within the areas targeted by this assay, and inadequate number of viral copies(<138 copies/mL). A negative result must be combined with clinical observations, patient history, and  epidemiological information. The expected result is Negative.  Fact Sheet for Patients:  BloggerCourse.com  Fact Sheet for Healthcare Providers:  SeriousBroker.it  This test is no t yet approved or cleared by the [place] FDA and  has been authorized for detection and/or diagnosis of SARS-CoV-2 by FDA under an Emergency Use Authorization (EUA). This EUA will remain  in effect (meaning this test can be used) for the duration of the COVID-19 declaration under Section 564(b)(1) of the Act, 21 U.S.C.section 360bbb-3(b)(1), unless the authorization is terminated  or revoked sooner.       Influenza A by PCR NEGATIVE NEGATIVE Final   Influenza B by PCR NEGATIVE NEGATIVE Final    Comment: (NOTE) The Xpert Xpress SARS-CoV-2/FLU/RSV plus assay is intended as an aid in the diagnosis of influenza from Nasopharyngeal swab specimens and should not be used as a sole basis for treatment. Nasal washings and aspirates are unacceptable for Xpert Xpress SARS-CoV-2/FLU/RSV testing.  Fact Sheet for Patients: BloggerCourse.com  Fact Sheet for Healthcare Providers: SeriousBroker.it  This test is not yet approved or cleared by the [place] FDA and has been authorized for detection and/or diagnosis of SARS-CoV-2 by FDA under an Emergency Use Authorization (EUA). This EUA will remain in effect (meaning this test can be used) for the duration of the COVID-19 declaration under Section 564(b)(1) of the Act, 21 U.S.C. section 360bbb-3(b)(1), unless the authorization is terminated or revoked.  Performed at  [facility], [address], [place], [place] [address]   MRSA PCR Screening     Status: None   Collection Time: 02/04/21  1:37 PM   Specimen: Nasal Mucosa; Nasopharyngeal  Result Value Ref Range Status   MRSA by PCR NEGATIVE NEGATIVE Final    Comment:        The GeneXpert MRSA Assay (FDA approved for NASAL specimens only), is one component of a comprehensive MRSA colonization surveillance program. It is not intended to diagnose MRSA infection nor to guide or monitor treatment for MRSA infections. Performed at [facility], [address] [place], [place] [address]   Blood Culture (routine x 2)     Status: None (Preliminary result)   Collection Time: 02/04/21  2:22 PM   Specimen: BLOOD RIGHT HAND  Result Value Ref Range Status   Specimen Description BLOOD RIGHT HAND  Final   Special Requests   Final    BOTTLES DRAWN AEROBIC ONLY Blood Culture results may not be optimal due to an inadequate volume of blood received in culture bottles   Culture   Final    NO GROWTH 4 DAYS Performed at [facility], [address] [place], [place] [address]    Report Status PENDING  Incomplete  Blood Culture (routine x 2)     Status: None (Preliminary result)   Collection Time: 02/04/21  2:37 PM   Specimen: BLOOD LEFT HAND  Result Value Ref Range Status   Specimen Description BLOOD LEFT HAND  Final   Special Requests   Final    BOTTLES DRAWN AEROBIC ONLY Blood Culture results may not be optimal due to an inadequate volume of blood received in culture bottles   Culture   Final    NO GROWTH 4 DAYS Performed at [facility], [address], [place], [place] [address]    Report Status PENDING  Incomplete     Radiology Studies: DG Abd 1 View  Result Date: 02/06/2021 CLINICAL DATA:  Abdominal pain and distention. EXAM:  ABDOMEN - 1 VIEW COMPARISON:  02/04/2021 FINDINGS: The previously demonstrated enteric tube appears to have been removed. There is nonspecific gaseous  distension the:. There is no significant evidence for a small bowel obstruction. There are retrocardiac airspace opacities suggestive of atelectasis. There is no pneumatosis or free air. There is a relative paucity of bowel gas at the level of the rectum. IMPRESSION: Nonspecific gaseous distension of the colon. Electronically Signed   By: Katherine Mantle M.D.   On: 02/06/2021 15:28   MR LUMBAR SPINE WO CONTRAST  Result Date: 02/07/2021 CLINICAL DATA:  Low back pain with left foot drop. EXAM: MRI LUMBAR SPINE WITHOUT CONTRAST TECHNIQUE: Multiplanar, multisequence MR imaging of the lumbar spine was performed. No intravenous contrast was administered. COMPARISON:  Lumbar radiographs 09/29/2007 FINDINGS: Segmentation: Motion degraded study. The patient was not able to complete the study and axial images were not obtained. Normal segmentation. Alignment:  Normal Vertebrae:  Negative for fracture or mass. Conus medullaris and cauda equina: Conus not well visualized. Paraspinal and other soft tissues: Extensive edema is present in the right paraspinous muscles without fluid collection. Left muscles appear normal. Disc levels: L1-2: Negative L2-3: Negative L3-4: Disc degeneration with disc bulging and associated spurring. No significant stenosis L4-5: Disc degeneration with disc bulging and associated endplate spurring. Mild to moderate subarticular stenosis bilaterally L5-S1: Mild disc bulging.  Mild subarticular stenosis bilaterally. IMPRESSION: Incomplete and motion degraded study. The patient was not able to tolerate axial images There is extensive edema in the right paraspinous muscle suggesting myositis. This could be infectious or posttraumatic. Mild lumbar degenerative disc disease at L3-4, L4-5, L5-S1. Electronically Signed   By: Marlan Palau M.D.   On: 02/07/2021 15:13   VAS [place] LOWER EXTREMITY VENOUS (DVT)  Result Date: 02/07/2021  Lower Venous DVT Study Indications: Edema.  Risk Factors: None  identified. Limitations: Poor ultrasound/tissue interface and body habitus. Comparison Study: No prior studies. Performing Technologist: Chanda Busing RVT  Examination Guidelines: A complete evaluation includes B-mode imaging, spectral Doppler, color Doppler, and power Doppler as needed of all accessible portions of each vessel. Bilateral testing is considered an integral part of a complete examination. Limited examinations for reoccurring indications may be performed as noted. The reflux portion of the exam is performed with the patient in reverse Trendelenburg.  +---------+---------------+---------+-----------+----------+--------------+ RIGHT    CompressibilityPhasicitySpontaneityPropertiesThrombus Aging +---------+---------------+---------+-----------+----------+--------------+ CFV      Full           Yes      Yes                                 +---------+---------------+---------+-----------+----------+--------------+ SFJ      Full                                                        +---------+---------------+---------+-----------+----------+--------------+ FV Prox  Full                                                        +---------+---------------+---------+-----------+----------+--------------+ FV Mid   Full                                                        +---------+---------------+---------+-----------+----------+--------------+  FV DistalFull                                                        +---------+---------------+---------+-----------+----------+--------------+ PFV      Full                                                        +---------+---------------+---------+-----------+----------+--------------+ POP      Full           Yes      Yes                                 +---------+---------------+---------+-----------+----------+--------------+ PTV      Full                                                         +---------+---------------+---------+-----------+----------+--------------+ PERO     Full                                                        +---------+---------------+---------+-----------+----------+--------------+   +---------+---------------+---------+-----------+----------+--------------+ LEFT     CompressibilityPhasicitySpontaneityPropertiesThrombus Aging +---------+---------------+---------+-----------+----------+--------------+ CFV      Full           Yes      Yes                                 +---------+---------------+---------+-----------+----------+--------------+ SFJ      Full                                                        +---------+---------------+---------+-----------+----------+--------------+ FV Prox  Full                                                        +---------+---------------+---------+-----------+----------+--------------+ FV Mid   Full                                                        +---------+---------------+---------+-----------+----------+--------------+ FV DistalFull                                                        +---------+---------------+---------+-----------+----------+--------------+  PFV      Full                                                        +---------+---------------+---------+-----------+----------+--------------+ POP      Full           Yes      Yes                                 +---------+---------------+---------+-----------+----------+--------------+ PTV      Full                                                        +---------+---------------+---------+-----------+----------+--------------+ PERO     Full                                                        +---------+---------------+---------+-----------+----------+--------------+     Summary: RIGHT: - There is no evidence of deep vein thrombosis in the lower extremity. However, portions of this  examination were limited- see technologist comments above.  - No cystic structure found in the popliteal fossa.  LEFT: - There is no evidence of deep vein thrombosis in the lower extremity. However, portions of this examination were limited- see technologist comments above.  - No cystic structure found in the popliteal fossa.  *See table(s) above for measurements and observations. Electronically signed by Fabienne Bruns MD on 02/07/2021 at 5:53:12 PM.    Final      LOS: 4 days   Lanae Boast, MD [facility]  02/08/2021, 7:55 AM

## 2021-02-08 NOTE — Progress Notes (Signed)
Inpatient Rehabilitation Admissions Coordinator  I will place order for rehab consult per protocol.  Ottie Glazier, RN, MSN Rehab Admissions Coordinator [phone] 02/08/2021 4:44 PM

## 2021-02-09 DIAGNOSIS — N179 Acute kidney failure, unspecified: Secondary | ICD-10-CM | POA: Diagnosis not present

## 2021-02-09 LAB — CULTURE, BLOOD (ROUTINE X 2)
Culture: NO GROWTH
Culture: NO GROWTH
Culture: NO GROWTH
Culture: NO GROWTH

## 2021-02-09 LAB — COMPREHENSIVE METABOLIC PANEL
ALT: 255 U/L — ABNORMAL HIGH (ref 0–44)
AST: 659 U/L — ABNORMAL HIGH (ref 15–41)
Albumin: 2.4 g/dL — ABNORMAL LOW (ref 3.5–5.0)
Alkaline Phosphatase: 47 U/L (ref 38–126)
Anion gap: 9 (ref 5–15)
BUN: 13 mg/dL (ref 6–20)
CO2: 25 mmol/L (ref 22–32)
Calcium: 8.4 mg/dL — ABNORMAL LOW (ref 8.9–10.3)
Chloride: 101 mmol/L (ref 98–111)
Creatinine, Ser: 1.11 mg/dL (ref 0.61–1.24)
GFR, Estimated: >60 mL/min (ref >60)
Glucose, Bld: 104 mg/dL — ABNORMAL HIGH (ref 70–99)
Potassium: 3.8 mmol/L (ref 3.5–5.1)
Sodium: 135 mmol/L (ref 135–145)
Total Bilirubin: 1.1 mg/dL (ref 0.3–1.2)
Total Protein: 5.4 g/dL — ABNORMAL LOW (ref 6.5–8.1)

## 2021-02-09 LAB — CK: Total CK: 32759 U/L — ABNORMAL HIGH (ref 49–397)

## 2021-02-09 LAB — CBC
HCT: 36.6 % — ABNORMAL LOW (ref 39.0–52.0)
Hemoglobin: 13.2 g/dL (ref 13.0–17.0)
MCH: 34.8 pg — ABNORMAL HIGH (ref 26.0–34.0)
MCHC: 36.1 g/dL — ABNORMAL HIGH (ref 30.0–36.0)
MCV: 96.6 fL (ref 80.0–100.0)
Platelets: 160 10*3/uL (ref 150–400)
RBC: 3.79 MIL/uL — ABNORMAL LOW (ref 4.22–5.81)
RDW: 11.5 % (ref 11.5–15.5)
WBC: 14.6 10*3/uL — ABNORMAL HIGH (ref 4.0–10.5)
nRBC: 0 % (ref 0.0–0.2)

## 2021-02-09 MED ORDER — METHOCARBAMOL 500 MG PO TABS
750.0000 mg | ORAL_TABLET | Freq: Four times a day (QID) | ORAL | Status: DC | PRN
  Administered 2021-02-09 – 2021-02-11 (×7): 750 mg via ORAL
  Filled 2021-02-09 (×7): qty 2

## 2021-02-09 MED ORDER — INFLUENZA VAC SPLIT QUAD 0.5 ML IM SUSY: 0.5000 mL | PREFILLED_SYRINGE | INTRAMUSCULAR | Status: DC

## 2021-02-09 MED ORDER — TRAZODONE HCL 50 MG PO TABS
50.0000 mg | ORAL_TABLET | Freq: Every evening | ORAL | Status: DC | PRN
  Administered 2021-02-09 – 2021-02-10 (×2): 50 mg via ORAL
  Filled 2021-02-09 (×2): qty 1

## 2021-02-09 NOTE — Progress Notes (Signed)
Physical Therapy Treatment Patient Details Name: Shane Guzman MRN: [id number] DOB: 07/12/1976 Today's Date: 02/09/2021    History of Present Illness 45 year old man with obesity, hypertension, history of sigmoid diverticulitis (perforation 2012), OSA on CPAP with marginal compliance, tobacco and alcohol use.  He was brought to the ED 2/18 after being unresponsive and difficult to arouse in the early morning. Pt with pinpoint pupils, agonal breathing and foaming at the mouth, EMS with brief stent of CPR, came to with narcan. UDS negative.    PT Comments    Pt received in supine, agreeable to therapy session and with good participation and tolerance for mobility. Primary session focus on safety with transfers and gait training, pt performed stand pivot to [facility] then 22ft gait in room with RW, needing minA and chair follow for safety as pt impulsive to sit when fatigued. He was agreeable to sit up in chair for lunch. Pt continues to benefit from PT services to progress toward functional mobility goals. Continue to recommend CIR.  Follow Up Recommendations  CIR     Equipment Recommendations       Recommendations for Other Services Rehab consult     Precautions / Restrictions Precautions Precautions: Fall Precaution Comments: LLE edema/numbness/foot drop Restrictions Weight Bearing Restrictions: Yes RLE Weight Bearing: Weight bearing as tolerated LLE Weight Bearing: Weight bearing as tolerated Other Position/Activity Restrictions: "WBAT with functional AFO" per ortho MD note 2/20    Mobility  Bed Mobility Overal bed mobility: Needs Assistance Bed Mobility: Rolling;Sidelying to Sit Rolling: Supervision Sidelying to sit: Min guard;HOB elevated       General bed mobility comments: to L EOB, cues for log rolling for back/abdominal comfort, heavy use of bed features/rail    Transfers Overall transfer level: Needs assistance Equipment used: Rolling walker (2 wheeled) Transfers:  Sit to/from Stand Sit to Stand: Min assist;+2 safety/equipment         General transfer comment: from EOB and BSC heights to RW, impulsive to stand and heavy BUE reliance on RW, cues for safety  Ambulation/Gait Ambulation/Gait assistance: Min assist Gait Distance (Feet): 10 Feet Assistive device: Rolling walker (2 wheeled) Gait Pattern/deviations: Step-to pattern;Decreased stance time - left;Decreased dorsiflexion - left;Decreased weight shift to left;Antalgic;Wide base of support Gait velocity: slowed Gait velocity interpretation: <1.31 ft/sec, indicative of household ambulator General Gait Details: close chair follow, fair use of RW and able to place L forefoot on floor but still has antalgic gait due to moderate LLE pain; no LOB but fatigues quickly   Stairs             Wheelchair Mobility    Modified Rankin (Stroke Patients Only)       Balance Overall balance assessment: Needs assistance Sitting-balance support: Feet supported;Bilateral upper extremity supported Sitting balance-Leahy Scale: Fair Sitting balance - Comments: 1-2 UE support     Standing balance-Leahy Scale: Poor Standing balance comment: requires heavy UE support on RW and external assist                            Cognition Arousal/Alertness: Awake/alert Behavior During Therapy: WFL for tasks assessed/performed;Impulsive Overall Cognitive Status: Within Functional Limits for tasks assessed                                 General Comments: oriented to month/year, not to day of week but calendar in room was wrong and  corrected for him; impulsive to get to [facility] but follows 1-step commands      Exercises      General Comments General comments (skin integrity, edema, etc.): pt instructed on "general strengthening" handout encouraged to perform LLE TID as able.      Pertinent Vitals/Pain Pain Assessment: 0-10 Pain Score: 7  Pain Location: low back pain and LLE Pain  Descriptors / Indicators: Grimacing;Guarding;Discomfort Pain Intervention(s): Monitored during session;Repositioned (heels floated, BLE elevated in chair)    Home Living                      Prior Function            PT Goals (current goals can now be found in the care plan section) Acute Rehab PT Goals Patient Stated Goal: get OOB and walk, decrease pain PT Goal Formulation: With patient Time For Goal Achievement: 02/20/21 Potential to Achieve Goals: Good Progress towards PT goals: Progressing toward goals    Frequency    Min 4X/week      PT Plan Current plan remains appropriate    Co-evaluation              AM-PAC PT "6 Clicks" Mobility   Outcome Measure  Help needed turning from your back to your side while in a flat bed without using bedrails?: A Little Help needed moving from lying on your back to sitting on the side of a flat bed without using bedrails?: A Little Help needed moving to and from a bed to a chair (including a wheelchair)?: A Little Help needed standing up from a chair using your arms (e.g., wheelchair or bedside chair)?: A Lot Help needed to walk in hospital room?: A Little Help needed climbing 3-5 steps with a railing? : Total 6 Click Score: 15    End of Session Equipment Utilized During Treatment: Gait belt Activity Tolerance: Patient tolerated treatment well Patient left: with call bell/phone within reach;in chair (reclined, lunch tray in front, per RN pt OK without chair alarm instructed on call bell use) Nurse Communication: Mobility status PT Visit Diagnosis: Unsteadiness on feet (R26.81);Muscle weakness (generalized) (M62.81);Difficulty in walking, not elsewhere classified (R26.2)     Time: 8119-1478 PT Time Calculation (min) (ACUTE ONLY): 18 min  Charges:  $Gait Training: 8-22 mins                     Matthew Cina P., PTA Acute Rehabilitation Services Pager: [phone] Office: [phone]    Angus Palms 02/09/2021,  2:15 PM

## 2021-02-09 NOTE — PMR Pre-admission (Signed)
PMR Admission Coordinator Pre-Admission Assessment  Patient: Shane Guzman is an 45 y.o., male MRN: [id number] DOB: Dec 19, 1975 Height: _0  (162.6 cm) Weight: 107.2 kg  Insurance Information HMO:     PPO: yes     PCP:      IPA:      80/20:      OTHER:  PRIMARY: [employer]     Policy#: [id number]      Subscriber: patient CM Name: Roselyn Reef for Leeroy Bock from [place]    Phone#: [phone]     Fax#: [phone] Pre-Cert#: [id number]   Approved for 5 days beginning 2/25 to 02/15/21 with updates due on 02/16/21      Employer:  Benefits:  Phone #: [phone]     Name: Christinia Gully. Date: 12/18/20-still active     Deduct: $1.500 ($0 met) Out of Pocket Max: $4,000 ($0 met)      Life Max: NA CIR: 85% coverage, 15% co-insurance      SNF: 85% coverage, 15% co-insurance; 100 per cal/yr Outpatient: 85% coverage, 15% co-insurance; prior auth required, 60 days/cal year     Co-Pay:  Home Health: 85% coverage, 15% co-insurance; 120 visits/cal yr combined      Co-Pay:  DME: 85 % coverage, 15% co-insurance, prior auth required     Co-Pay:  Providers: in-network  SECONDARY:       Policy#:      Phone#:   Development worker, community:       Phone#:   [employer]" for patients in Inpatient Rehabilitation Facilities with attached "Privacy Act [place] Records" was provided and verbally reviewed with: Patient  Emergency Contact Information Contact Information    Name Relation Home Work Mobile   Kumari,Shirley Grandmother [place], [place] other [phone]     Irving Burton Father   [phone]      Current Medical History  Patient Admitting Diagnosis: ARF History of Present Illness: 45 year old male with morbid obesity/OSA on CPAP with marginal compliance, tobacco and alcohol abuse, hypertension, chronic back pain, history of sigmoid diverticulitis with perforation in 2012 to the ED on 2/18 after being unresponsive and  difficult to arouse in the early morning at home was diaphoretic with foaming of the mouth but no witnessed seizure activity.Apparently he has not been using CPAP.  Was brought by EMS, had a pulse with agonal respiratory pattern and hypoxia status post Narcan with some improvement in mental status with agitation then received Ativan on arrival to the ED and 6 L nasal cannula oxygen, was hypotensive with lactic acidosis 8.2, renal failure, metabolic and respiratory acidosis, internal positivity for drug screen was negative, etoh 84 lab with rhabdomyolysis leukocytosis transaminitis renal failure hyperkalemia acidosis. CT abdomen with mild bilateral posterior basilar subsegmental atelectasis versus infiltrate but no hydronephrosis, normal liver and pancreas with sigmoid diverticulosis with some focal wall thickening in the proximal sigmoid and minimal surrounding inflammatory change, negative CT head/CT C-spine. Empirically placed on vancomycin and cefepime for possible septic shock, with aggressive IV fluid hydration. Patient was admitted to critical care-treated for acute metabolic encephalopathy/hypoxic respiratory failure/rhabdomyolysis with severe AKI hyperkalemia metabolic acidosis.  Seen by nephrology. Patient has been also complaining of left lower leg weakness/foot drop and edema pain diminished sensation-seen by Dr. Dirk Dress orthopedics 2/20 no evidence of compartment syndrome blood flow likely from peroneal nerve compression advised nerve conduction study. 2/21-He remembers hurting on the back ( can't' remember where else" remembers waking up in the hospital  and since then c/o back pain and can't feel the left leg and cannot move it and it is hurting too.reports he has chronic back pain he sees which is unchanged. He is able to flex his left knee.  MRI lumbar spine was done limited with motion artifact but shows "Extensive edema is present in the right paraspinous muscles without fluid collection." Pt. 's  hypokalemia, respiratory failure, transminitis, and encephalopathy all resolving with treatment.   Patient's medical record from [place] has been reviewed by the rehabilitation admission coordinator and physician.  Past Medical History  Past Medical History:  Diagnosis Date  . Diverticulitis of intestine with abscess   . Heart murmur   . Hypertension   . OSA on CPAP     Family History   family history includes Diverticulitis in his maternal grandmother.  Prior Rehab/Hospitalizations Has the patient had prior rehab or hospitalizations prior to admission? No  Has the patient had major surgery during 100 days prior to admission? No   Current Medications  Current Facility-Administered Medications:  .  0.9 %  sodium chloride infusion, , Intravenous, Continuous, Kc, Ramesh, MD, Last Rate: 100 mL/hr at 02/10/21 2303, New Bag at 02/10/21 2303 .  0.9 %  sodium chloride infusion, , Intravenous, PRN, Erick Colace, NP, Last Rate: 10 mL/hr at 02/05/21 1100, Infusion Verify at 02/05/21 1100 .  amoxicillin-clavulanate (AUGMENTIN) 875-125 MG per tablet 1 tablet, 1 tablet, Oral, Q12H, Erick Colace, NP, 1 tablet at 02/11/21 [phone] .  bisacodyl (DULCOLAX) suppository 10 mg, 10 mg, Rectal, Daily PRN, Anders Simmonds, MD .  Chlorhexidine Gluconate Cloth 2 % PADS 6 each, 6 each, Topical, Daily, Erick Colace, NP, 6 each at 02/09/21 0901 .  docusate sodium (COLACE) capsule 100 mg, 100 mg, Oral, BID PRN, Erick Colace, NP, 100 mg at 02/05/21 1819 .  famotidine (PEPCID) tablet 10 mg, 10 mg, Oral, Daily, Florencia Reasons, MD, 10 mg at 02/11/21 0806 .  heparin injection 5,000 Units, 5,000 Units, Subcutaneous, Q8H, Erick Colace, NP, 5,000 Units at 02/11/21 0511 .  influenza vac split quadrivalent PF (FLUARIX) injection 0.5 mL, 0.5 mL, Intramuscular, Tomorrow-1000, Kc, Ramesh, MD .  methocarbamol (ROBAXIN) tablet 750 mg, 750 mg, Oral, Q6H PRN, Kc, Ramesh, MD, 750 mg at 02/11/21  0521 .  metoprolol tartrate (LOPRESSOR) tablet 12.5 mg, 12.5 mg, Oral, BID, Erick Colace, NP, 12.5 mg at 02/11/21 0806 .  morphine 2 MG/ML injection 2 mg, 2 mg, Intravenous, Q4H PRN, Kc, Ramesh, MD, 2 mg at 02/11/21 0322 .  ondansetron (ZOFRAN) injection 4 mg, 4 mg, Intravenous, Q6H PRN, Erick Colace, NP, 4 mg at 02/05/21 1647 .  oxyCODONE (Oxy IR/ROXICODONE) immediate release tablet 5 mg, 5 mg, Oral, Q4H PRN, Kc, Ramesh, MD, 5 mg at 02/11/21 0806 .  polyethylene glycol (MIRALAX / GLYCOLAX) packet 17 g, 17 g, Oral, Daily, Florencia Reasons, MD, 17 g at 02/11/21 0806 .  senna-docusate (Senokot-S) tablet 1 tablet, 1 tablet, Oral, BID, Florencia Reasons, MD, 1 tablet at 02/11/21 [phone] .  simethicone (MYLICON) chewable tablet 80 mg, 80 mg, Oral, QID PRN, Florencia Reasons, MD, 80 mg at 02/08/21 1146 .  traZODone (DESYREL) tablet 50 mg, 50 mg, Oral, QHS PRN, Kc, Ramesh, MD, 50 mg at 02/10/21 2243  Patients Current Diet:  Diet Order            Diet - low sodium heart healthy           Diet  Heart Room service appropriate? Yes with Assist; Fluid consistency: Thin  Diet effective now                 Precautions / Restrictions Precautions Precautions: Fall Precaution Comments: LLE edema/numbness/foot drop Restrictions Weight Bearing Restrictions: Yes RLE Weight Bearing: Weight bearing as tolerated LLE Weight Bearing: Weight bearing as tolerated LLE Partial Weight Bearing Percentage or Pounds: 30 Other Position/Activity Restrictions: "WBAT with functional AFO" per ortho MD note 2/20   Has the patient had 2 or more falls or a fall with injury in the past year? No  Prior Activity Level Community (5-7x/wk): Pt. active in the community and working PTA  Prior Functional Level Self Care: Did the patient need help bathing, dressing, using the toilet or eating? Independent  Indoor Mobility: Did the patient need assistance with walking from room to room (with or without device)? Independent  Stairs: Did the  patient need assistance with internal or external stairs (with or without device)? Independent  Functional Cognition: Did the patient need help planning regular tasks such as shopping or remembering to take medications? Independent  Home Assistive Devices / Equipment Home Equipment: None  Prior Device Use: Indicate devices/aids used by the patient prior to current illness, exacerbation or injury? None of the above  Current Functional Level Cognition  Overall Cognitive Status: Impaired/Different from baseline Orientation Level: Oriented X4 Safety/Judgement: Decreased awareness of safety,Decreased awareness of deficits General Comments: somewhat impulsive to get up, cues for safety and pacing, pulling up on RW with poor carryover of cues to push from bed/chair    Extremity Assessment (includes Sensation/Coordination)  Upper Extremity Assessment: Overall WFL for tasks assessed  Lower Extremity Assessment: LLE deficits/detail LLE Deficits / Details: extremely swollen, especially hip/thigh, pt with no sensation or proprioception sensation ie, can't tell if foot was up or down, however is able to complete quad set, move ankle in DF/PF, glut sets and initiate L LE LAQ at EOB but can't not feel anything LLE Sensation: decreased light touch,decreased proprioception    ADLs  Overall ADL's : Needs assistance/impaired Eating/Feeding: Independent,Bed level Grooming: Set up,Bed level Upper Body Bathing: Set up,Bed level Lower Body Bathing: Total assistance,Bed level Upper Body Dressing : Minimal assistance,Bed level Lower Body Dressing: Total assistance,Bed level    Mobility  Overal bed mobility: Needs Assistance Bed Mobility: Rolling,Sit to Sidelying Rolling: Supervision Sidelying to sit: Min guard,HOB elevated Supine to sit: Mod assist,HOB elevated Sit to sidelying: Min assist General bed mobility comments: cues for log rolling for back/abdominal comfort, heavy use of bed features/rail,  minA for BLE    Transfers  Overall transfer level: Needs assistance Equipment used: Rolling walker (2 wheeled) Transfers: Sit to/from Stand Sit to Stand: +2 safety/equipment,Min guard  Lateral/Scoot Transfers: Mod assist,+2 safety/equipment (2nd person to hold chair in place) General transfer comment: from chair/EOB, pt impulsive to stand and heavy BUE reliance on RW, cues for safety    Ambulation / Gait / Stairs / Wheelchair Mobility  Ambulation/Gait Ambulation/Gait assistance: Herbalist (Feet): 30 Feet (26f, seated break, 274f Assistive device: Rolling walker (2 wheeled) Gait Pattern/deviations: Step-to pattern,Decreased stance time - left,Decreased dorsiflexion - left,Decreased weight shift to left,Antalgic,Wide base of support General Gait Details: close chair follow, fair use of RW and able to place L forefoot on floor but still has antalgic gait due to moderate LLE pain; no LOB but fatigues quickly Gait velocity: slowed Gait velocity interpretation: <1.31 ft/sec, indicative of household ambulator    Posture / Balance  Dynamic Sitting Balance Sitting balance - Comments: Supervision, no LOB U UE support Balance Overall balance assessment: Needs assistance Sitting-balance support: Feet supported,Bilateral upper extremity supported Sitting balance-Leahy Scale: Fair Sitting balance - Comments: Supervision, no LOB U UE support Standing balance-Leahy Scale: Poor Standing balance comment: requires heavy UE support on RW    Special needs/care consideration Continuous/IVPB: 0.9% sodium chloride infusion: 152m/hr and Designated visitor [name] Potts, significant other, SAlexzavier Girardin grandmother   Previous Home Environment (from acute therapy documentation) Living Arrangements: Alone Available Help at Discharge: Family,Friend(s),Available 24 hours/day (per friend [name]) Type of Home: House Home Layout: One level Home Access: Stairs to enter Entrance Stairs-Rails:  Left Entrance Stairs-Number of Steps: 11 Bathroom Shower/Tub: TChiropodist Standard Bathroom Accessibility: Yes How Accessible: Accessible via walker [place] Yes  Discharge Living Setting Plans for Discharge Living Setting: Patient's home Type of Home at Discharge: Apartment Discharge Home Layout: One level Discharge Home Access: Stairs to enter Entrance Stairs-Rails: Left,Can reach both,Right Entrance Stairs-Number of Steps: 11 Discharge Bathroom Shower/Tub: Tub/shower unit Discharge Bathroom Toilet: Standard Discharge Bathroom Accessibility: Yes How Accessible: Accessible via walker  Social/Family/Support Systems Patient Roles: Partner Contact Information: [phone] Caregiver: AHollice Gong(girlfriend) Anticipated Caregiver's Contact Information: [phone]/Limitations of Caregiver: 24/7 Caregiver Availability: 24/7 Discharge Plan Discussed with Primary Caregiver: Yes Is Caregiver In Agreement with Plan?: Yes Does Caregiver/Family have Issues with Lodging/Transportation while Pt is in Rehab?: Yes  Goals Patient/Family Goal for Rehab: PT/ OT Mod I Expected length of stay: 8-10 days Pt/Family Agrees to Admission and willing to participate: Yes Program Orientation Provided & Reviewed with Pt/Caregiver Including Roles  & Responsibilities: Yes  Decrease burden of Care through IP rehab admission: Specialzed equipment needs, Decrease number of caregivers, Bowel and bladder program and Patient/family education  Possible need for SNF placement upon discharge: not anticipated  Patient Condition: [facility] spoken with CM, and patient and family member. I met with patient at the bedside for inpatient rehabilitation assessment.  Patient will benefit from ongoing PT and OT, can actively participate in 3 hours of therapy a day 5 days of the week, and can make measurable gains  during the admission.  Patient will also benefit from the coordinated team approach during an Inpatient Acute Rehabilitation admission.  The patient will receive intensive therapy as well as Rehabilitation physician, nursing, social worker, and care management interventions.  Due to safety, skin/wound care, disease management, medication administration, pain management and patient education the patient requires 24 hour a day rehabilitation nursing.  The patient is currently min A with mobility and Min A- total A with basic ADLs.  Discharge setting and therapy post discharge at home with home health is anticipated.  Patient has agreed to participate in the Acute Inpatient Rehabilitation Program and will admit today.  Preadmission Screen Completed By: LGenella Mechwith day of admission updates by  LBethel Born 02/11/2021 10:11 AM ______________________________________________________________________   Discussed status with Dr. RRanell Patrickon 02/11/21  at 10:11 AM and received approval for admission today.  Admission Coordinator: LGenella Mech CCC-SLP with day of admission updates by  LBethel Born CCC-SLP, time 10:11 AM /Sudie Grumbling02/25/22    Assessment/Plan: Diagnosis: Debillity 1. Does the need for close, 24 hr/day Medical supervision in concert with the patient's rehab needs make it unreasonable for this patient to be served in a less intensive setting? Yes 2. Co-Morbidities requiring supervision/potential complications:  1. Acute  renal failure 2. Obesity (BMI 40.57) 3. Tobacco user 4. Diverticulitis 5. Murmur 3. Due to bladder management, bowel management, safety, skin/wound care, disease management, medication administration, pain management and patient education, does the patient require 24 hr/day rehab nursing? Yes 4. Does the patient require coordinated care of a physician, rehab nurse, PT, OT, and SLP to address physical and functional deficits in the context of the above  medical diagnosis(es)? Yes Addressing deficits in the following areas: balance, endurance, locomotion, strength, transferring, bowel/bladder control, bathing, dressing, feeding, grooming, toileting and psychosocial support 5. Can the patient actively participate in an intensive therapy program of at least 3 hrs of therapy 5 days a week? Yes 6. The potential for patient to make measurable gains while on inpatient rehab is excellent 7. Anticipated functional outcomes upon discharge from inpatient rehab: modified independent PT, modified independent OT, independent SLP 8. Estimated rehab length of stay to reach the above functional goals is: 7-8 days 9. Anticipated discharge destination: Home 10. Overall Rehab/Functional Prognosis: excellent   MD Signature: Leeroy Cha, MD

## 2021-02-09 NOTE — Progress Notes (Signed)
Pt refused CPAP tonight after wearing CPAP less than an hour. No respiratory distress. SPO2 96-98% on room air. Vital sings stable, afebrile. Pain controlled well with Morphine and Oxy PRN. We continue to monitor.  Filiberto Pinks, RN

## 2021-02-09 NOTE — Progress Notes (Signed)
OT Cancellation Note  Patient Details Name: Shane Guzman MRN: [id number] DOB: 12/12/1976   Cancelled Treatment:    Reason Eval/Treat Not Completed: Patient declined, earlier he was being seen by PT, and was up in recliner.  When OT returned, he was already back in bed.  OT to continue efforts.    Adrena Nakamura D Javan Gonzaga 02/09/2021, 3:48 PM

## 2021-02-09 NOTE — Progress Notes (Signed)
Physical Therapy Treatment Patient Details Name: Shane Guzman MRN: [id number] DOB: 1976/09/20 Today's Date: 02/09/2021    History of Present Illness 45 year old man with obesity, hypertension, history of sigmoid diverticulitis (perforation 2012), OSA on CPAP with marginal compliance, tobacco and alcohol use.  He was brought to the ED 2/18 after being unresponsive and difficult to arouse in the early morning. Pt with pinpoint pupils, agonal breathing and foaming at the mouth, EMS with brief stent of CPR, came to with narcan. UDS negative.    PT Comments    PTA called back into room to assist with return from chair>bed, pt able to perform transfer from chair with minA and close chair follow for gait training to opposite side of bed ~50ft with RW. Pt instructed on pressure offloading and needed minA for log roll to supine. Pt requesting pain meds for LLE. Pt continues to benefit from PT services to progress toward functional mobility goals. Continue to recommend CIR.  Follow Up Recommendations  CIR     Equipment Recommendations       Recommendations for Other Services Rehab consult     Precautions / Restrictions Precautions Precautions: Fall Precaution Comments: LLE edema/numbness/foot drop Restrictions Weight Bearing Restrictions: Yes RLE Weight Bearing: Weight bearing as tolerated LLE Weight Bearing: Weight bearing as tolerated Other Position/Activity Restrictions: "WBAT with functional AFO" per ortho MD note 2/20    Mobility  Bed Mobility Overal bed mobility: Needs Assistance Bed Mobility: Rolling;Sit to Sidelying Rolling: Supervision Sidelying to sit: Min guard;HOB elevated     Sit to sidelying: Min assist General bed mobility comments: cues for log rolling for back/abdominal comfort, heavy use of bed features/rail    Transfers Overall transfer level: Needs assistance Equipment used: Rolling walker (2 wheeled) Transfers: Sit to/from Stand Sit to Stand: Min  assist;+2 safety/equipment         General transfer comment: from chair, pt impulsive to stand and heavy BUE reliance on RW, cues for safety  Ambulation/Gait Ambulation/Gait assistance: Min assist Gait Distance (Feet): 15 Feet Assistive device: Rolling walker (2 wheeled) Gait Pattern/deviations: Step-to pattern;Decreased stance time - left;Decreased dorsiflexion - left;Decreased weight shift to left;Antalgic;Wide base of support Gait velocity: slowed Gait velocity interpretation: <1.31 ft/sec, indicative of household ambulator General Gait Details: close chair follow, fair use of RW and able to place L forefoot on floor but still has antalgic gait due to moderate LLE pain; no LOB but fatigues quickly   Stairs             Wheelchair Mobility    Modified Rankin (Stroke Patients Only)       Balance Overall balance assessment: Needs assistance Sitting-balance support: Feet supported;Bilateral upper extremity supported Sitting balance-Leahy Scale: Fair Sitting balance - Comments: 1-2 UE support     Standing balance-Leahy Scale: Poor Standing balance comment: requires heavy UE support on RW and external assist                            Cognition Arousal/Alertness: Awake/alert Behavior During Therapy: WFL for tasks assessed/performed;Impulsive Overall Cognitive Status: Within Functional Limits for tasks assessed                                 General Comments: somewhat impulsive to get up, cues for safety and pacing      Exercises General Exercises - Lower Extremity Ankle Circles/Pumps: AAROM;Left;AROM;Right;10 reps;Seated (AA on LLE,  AROM on RLE) Quad Sets: AROM;Both;10 reps;Supine Gluteal Sets: AROM;Both;5 reps;Supine    General Comments General comments (skin integrity, edema, etc.): instructed on pressure offloading, HEP handout in room      Pertinent Vitals/Pain Pain Assessment: 0-10 Pain Score: 9  Pain Location: low back pain  and LLE Pain Descriptors / Indicators: Grimacing;Guarding;Discomfort;Tingling Pain Intervention(s): Monitored during session;Repositioned;Patient requesting pain meds-RN notified (heels floated)    Home Living                      Prior Function            PT Goals (current goals can now be found in the care plan section) Acute Rehab PT Goals Patient Stated Goal: decrease pain PT Goal Formulation: With patient Time For Goal Achievement: 02/20/21 Potential to Achieve Goals: Good Progress towards PT goals: Progressing toward goals    Frequency    Min 4X/week      PT Plan Current plan remains appropriate    Co-evaluation              AM-PAC PT "6 Clicks" Mobility   Outcome Measure  Help needed turning from your back to your side while in a flat bed without using bedrails?: A Little Help needed moving from lying on your back to sitting on the side of a flat bed without using bedrails?: A Little Help needed moving to and from a bed to a chair (including a wheelchair)?: A Little Help needed standing up from a chair using your arms (e.g., wheelchair or bedside chair)?: A Little Help needed to walk in hospital room?: A Little Help needed climbing 3-5 steps with a railing? : Total 6 Click Score: 16    End of Session Equipment Utilized During Treatment: Gait belt Activity Tolerance: Patient tolerated treatment well Patient left: in bed;with bed alarm set (heels floated) Nurse Communication: Mobility status PT Visit Diagnosis: Unsteadiness on feet (R26.81);Muscle weakness (generalized) (M62.81);Difficulty in walking, not elsewhere classified (R26.2)     Time: 5638-7564 PT Time Calculation (min) (ACUTE ONLY): 10 min  Charges:  $Gait Training: 8-22 mins                     Sylvan Sookdeo P., PTA Acute Rehabilitation Services Pager: [phone] Office: [phone]   [place] 02/09/2021, 4:12 PM

## 2021-02-09 NOTE — Progress Notes (Signed)
PROGRESS NOTE    Shane Guzman  BUL:[id number] DOB: 06-16-1976 DOA: 02/04/2021 PCP: Gaspar Garbe, MD   Chief Complaint  Patient presents with  . Altered Mental Status    Brief Narrative: 45 year old male with morbid obesity/OSA on CPAP with marginal compliance, tobacco and alcohol abuse, hypertension, chronic back pain, history of sigmoid diverticulitis with perforation in 2012 to the ED on 2/18 after being unresponsive and difficult to arouse in the early morning at home was diaphoretic with foaming of the mouth but no witnessed seizure activity.Apparently he has not been using CPAP.  Was brought by EMS, had a pulse with agonal respiratory pattern and hypoxia status post Narcan with some improvement in mental status with agitation then received Ativan on arrival to the ED and 6 L nasal cannula oxygen, was hypotensive with lactic acidosis 8.2, renal failure, metabolic and respiratory acidosis, internal positivity for drug screen was negative, etoh 84 lab with rhabdomyolysis leukocytosis transaminitis renal failure hyperkalemia acidosis. CT abdomen with mild bilateral posterior basilar subsegmental atelectasis versus infiltrate but no hydronephrosis, normal liver and pancreas with sigmoid diverticulosis with some focal wall thickening in the proximal sigmoid and minimal surrounding inflammatory change, negative CT head/CT C-spine. Empirically placed on vancomycin and cefepime for possible septic shock, with aggressive IV fluid hydration. Patient was admitted to critical care-treated for acute metabolic encephalopathy/hypoxic respiratory failure/rhabdomyolysis with severe AKI hyperkalemia metabolic acidosis.  Seen by nephrology. Patient has been also complaining of left lower leg weakness/foot drop and edema pain diminished sensation-seen by Dr. Jerrel Ivory orthopedics 2/20 no evidence of compartment syndrome blood flow likely from peroneal nerve compression advised nerve conduction study. 2/21-He  remembers hurting on the back ( can't' remember where else" remembers waking up in the hospital and since then c/o back pain and can't feel the left leg and cannot move it and it is hurting too.reports he has chronic back pain he sees which is unchanged. He is able to flex his left knee.  MRI lumbar spine was done limited with motion artifact but shows "Extensive edema is present in the right paraspinous muscles without fluid collection"  Subjective: Overnight he refused CPAP machine. Woke up on calling him Yesterday he walked for mrestroom to bed with walkerpt. Still with left foot drop- able to straighten his lt leg, flex some at left hip but has swelling and pain at left thigh limiting mobility. His grandmother on the phone- updated Reports he did not sleep well last night, trazodone added  Assessment & Plan:  Acute kidney injury from combination of volume contraction/pigment nephropathy from rhabdomyolysis Hyperkalemia due to AKI Anion gap with a severe acidosis due to AKI Renal function normalized.  Nephrology signed off.  Now with polyuria, continue IV fluid hydration for rhabdomyolysis .  Encourage oral intake activity.  Hopefully can remove Foley catheter once urine output decreases, patient wants to keep it at least 1 more day.Avoid entersto/aceARBs at least for 4 weeks. significant diuresis 4.5liter. Recent Labs  Lab 02/05/21 1453 02/06/21 0219 02/07/21 0126 02/08/21 0104 02/09/21 0112  BUN 24* 24* 19 16 13   CREATININE 1.85* 1.68* 1.30* 1.18 1.11   Rhabdomyolysis-likely from prolonged immobilization, MRI back limited due to motion artifact but shows right paraspinous myositis-no fluid collection. Reportedly his one leg was hanging from bed at the edematous left thigh. CK > 50K- down to 32k, keep on ivf NSS. Trend CK. Blood cx neg from 2/18.Continue pain control  Hypokalemia- repleted  Left foot drop left peroneal nerve compression,no evidence of compartment  syndrome seen by  Dr. Charlann Boxerlin from orthopedic.  MRI lumbar spine limited but shows right-sided paraspinous myositis.Advised AFO to support left foot until recovery and needs NCS and f/u w/ Dr.  Ethelene Halamos from orthopedic.  Acute hypoxic and hypercapnic respiratory failure with possible aspiration pneumonia/pneumonitis on admission: On admission needing nonrebreather did not require intubation.  Off oxygen at rest.  Continue CPAP at night counseled.Antibiotic has been changed to Augmentin for 5 days total.Blood culture urine culture no growth so far.  WBC still up we will monitor.  Heavy alcohol abuse- he reports he drank 1 pint of liquor before going to bed on the night before admission.  He reports "I need to quit:. Counseled to quit alcohol.  Acute metabolic encephalopathy, suspect etoh induced, was found unresponsive at home.CT head unremarkable. Mental status improved patient does not have much recollection of what happened.  He is alert awake oriented x3, mobilizing more.   Abnormal LFTs/transaminitis due to rhabdomyolysis.  LFTs improving AST/ALT peaked 1500/500>> 600s/255.Acute viral hepatitis panel was unremarkable.  Monitor. Recent Labs  Lab 02/04/21 0724 02/04/21 1202 02/05/21 1453 02/06/21 0219 02/07/21 0126 02/08/21 0104 02/09/21 0112  AST  --    < > 1,517* 1,243* 938* 783* 659*  ALT  --    < > 526* 428* 335* 292* 255*  ALKPHOS  --    < > 60 50 50 54 47  BILITOT  --    < > 1.1 1.1 1.0 0.9 1.1  PROT  --    < > 6.6 6.0* 6.0* 5.7* 5.4*  ALBUMIN  --    < > 2.8* 2.6* 2.5* 2.4* 2.4*  INR 1.2  --   --   --   --   --   --    < > = values in this interval not displayed.   Hypertension: BP on higher side. holding ARB/diuretics.continue metoprolol.  Morbid Obesity w BMI > 39: OP follow-up and weight loss  OSA on CPAP with marginal compliance.  Continue bedtime CPAP refused last night.  Focal wall thickening incidentally noted on the CT abdomen on proximal sigmoid colon-uncertain if acute diverticulitis or  sequelae of previous episode no abdominal pain.  Insomnia added trazodone at bedtime per request  Nutrition: Diet Order            Diet Heart Room service appropriate? Yes with Assist; Fluid consistency: Thin  Diet effective now               Pt's Body mass index is 39.89 kg/m.  DVT prophylaxis: heparin injection 5,000 Units Start: 02/04/21 1400 Code Status:   Code Status: Full Code  Family Communication: plan of care discussed with patient at bedside. I had updated his significant other 2/22.  Again updated his mother based upon his request today  Status is: Inpatient Remains inpatient appropriate because:Ongoing diagnostic testing needed not appropriate for outpatient work up, IV treatments appropriate due to intensity of illness or inability to take PO and Inpatient level of care appropriate due to severity of illness  Dispo: The patient is from:apartment with girl friend and son              Anticipated d/c is to:[facility]. Continue to work with PT OT              Anticipated d/c date is:2-3 days              Patient currently is not medically stable to d/c.   Difficult to place patient  No  Consultants:see note.  Procedures:see note.  Unresulted Labs (From admission, onward)          Start     Ordered   02/08/21 0500  CK  Daily,   R     Question:  Specimen collection method  Answer:  Lab=Lab collect   02/07/21 0728   02/08/21 0500  CBC  Daily,   R     Question:  Specimen collection method  Answer:  Lab=Lab collect   02/07/21 0847   02/08/21 0500  Comprehensive metabolic panel  Daily,   R     Question:  Specimen collection method  Answer:  Lab=Lab collect   02/07/21 0847        Culture/Microbiology    Component Value Date/Time   SDES BLOOD LEFT HAND 02/04/2021 1437   SPECREQUEST  02/04/2021 1437    BOTTLES DRAWN AEROBIC ONLY Blood Culture results may not be optimal due to an inadequate volume of blood received in culture bottles   CULT  02/04/2021 1437    NO GROWTH  5 DAYS Performed at [facility], [address] [place], [place] [address]    REPTSTATUS 02/09/2021 FINAL 02/04/2021 1437    Other culture-see note  Medications: Scheduled Meds: . amoxicillin-clavulanate  1 tablet Oral Q12H  . Chlorhexidine Gluconate Cloth  6 each Topical Daily  . famotidine  10 mg Oral Daily  . heparin  5,000 Units Subcutaneous Q8H  . metoprolol tartrate  12.5 mg Oral BID  . polyethylene glycol  17 g Oral Daily  . senna-docusate  1 tablet Oral BID   Continuous Infusions: . sodium chloride 125 mL/hr at 02/09/21 0257  . sodium chloride 10 mL/hr at 02/05/21 1100    Antimicrobials: Anti-infectives (From admission, onward)   Start     Dose/Rate Route Frequency Ordered Stop   02/05/21 1245  amoxicillin-clavulanate (AUGMENTIN) 875-125 MG per tablet 1 tablet        1 tablet Oral Every 12 hours 02/05/21 1157     02/05/21 0800  ceFEPIme (MAXIPIME) 2 g in sodium chloride 0.9 % 100 mL IVPB  Status:  Discontinued        2 g 200 mL/hr over 30 Minutes Intravenous Every 24 hours 02/04/21 0813 02/05/21 1157   02/04/21 0812  vancomycin variable dose per unstable renal function (pharmacist dosing)  Status:  Discontinued         Does not apply See admin instructions 02/04/21 0812 02/05/21 1157   02/04/21 0730  ceFEPIme (MAXIPIME) 2 g in sodium chloride 0.9 % 100 mL IVPB        2 g 200 mL/hr over 30 Minutes Intravenous  Once 02/04/21 0725 02/04/21 0842   02/04/21 0730  metroNIDAZOLE (FLAGYL) IVPB 500 mg        500 mg 100 mL/hr over 60 Minutes Intravenous  Once 02/04/21 0725 02/04/21 0932   02/04/21 0730  vancomycin (VANCOREADY) IVPB 2000 mg/400 mL        2,000 mg 200 mL/hr over 120 Minutes Intravenous  Once 02/04/21 0725 02/04/21 1122     Objective: Vitals: Today's Vitals   02/09/21 0400 02/09/21 0500 02/09/21 0540 02/09/21 0700  BP: (!) 141/97 (!) 145/86    Pulse: 81 82    Resp: 17 16    Temp:      TempSrc:      SpO2: 97% 95%    Weight:      Height:       PainSc:  10-Worst pain ever  Asleep 9     Intake/Output Summary (Last 24 hours) at 02/09/2021 0759 Last data filed at 02/09/2021 0500 Gross per 24 hour  Intake 4216.23 ml  Output 4500 ml  Net -283.77 ml   Filed Weights   02/05/21 0500 02/07/21 0448 02/09/21 0358  Weight: 104 kg 105.6 kg 105.4 kg   Weight change:   Intake/Output from previous day: 02/22 0701 - 02/23 0700 In: 4216.2 [P.O.:500; I.V.:2516.2] Out: 4500 [Urine:4500] Intake/Output this shift: No intake/output data recorded. Filed Weights   02/05/21 0500 02/07/21 0448 02/09/21 0358  Weight: 104 kg 105.6 kg 105.4 kg    Examination: General exam: AAOx3, obese, NAD, weak appearing. HEENT:Oral mucosa moist, Ear/Nose WNL grossly, dentition normal. Respiratory system: bilaterally clear,no wheezing or crackles,no use of accessory muscle Cardiovascular system: S1 & S2 +, No JVD,. Gastrointestinal system: Abdomen soft, NT,ND, BS+ Nervous System:Alert, awake, moving hsi legs- left foot drop, abel to straighten left knee, abel to flex left thigh Extremities: left thigh edematous, distal peripheral pulses palpable.  Skin: No rashes,no icterus. MSK: Normal muscle bulk,tone, power Foley+  Data Reviewed: I have personally reviewed following labs and imaging studies CBC: Recent Labs  Lab 02/04/21 0617 02/04/21 0630 02/05/21 1453 02/06/21 0219 02/07/21 0126 02/08/21 0104 02/09/21 0112  WBC 15.0*  --  13.1* 13.6* 13.7* 12.4* 14.6*  NEUTROABS 11.7*  --   --   --  10.8*  --   --   HGB 17.0   < > 16.7 15.2 13.6 13.3 13.2  HCT 54.3*   < > 48.0 45.0 41.1 39.1 36.6*  MCV 105.6*  --  97.0 99.1 100.5* 100.5* 96.6  PLT 207  --  135* 135* 126* 138* 160   < > = values in this interval not displayed.   Basic Metabolic Panel: Recent Labs  Lab 02/04/21 1202 02/04/21 2139 02/05/21 0346 02/05/21 1453 02/06/21 0219 02/07/21 0126 02/08/21 0104 02/09/21 0112  NA 142   < > 135 134* 132* 135 134* 135  K 6.3*   < > 4.0 3.9 3.9  3.7 3.3* 3.8  CL 107   < > 95* 92* 95* 98 100 101  CO2 15*   < > GLUCOSE 87   < > 135* 136* 124* 115* 141* 104*  BUN 20   < > 25* 24* 24* CREATININE 3.22*   < > 2.16* 1.85* 1.68* 1.30* 1.18 1.11  CALCIUM 8.3*   < > 7.8* 7.8* 8.0* 8.2* 8.2* 8.4*  MG 2.5*  --  2.1  --   --   --   --   --   PHOS 5.1*  --  4.8*  --   --   --   --   --    < > = values in this interval not displayed.   GFR: Estimated Creatinine Clearance: 92.4 mL/min (by C-G formula based on SCr of 1.11 mg/dL). Liver Function Tests: Recent Labs  Lab 02/05/21 1453 02/06/21 0219 02/07/21 0126 02/08/21 0104 02/09/21 0112  AST 1,517* 1,243* 938* 783* 659*  ALT 526* 428* 335* 292* 255*  ALKPHOS 60 50 50 54 47  BILITOT 1.1 1.1 1.0 0.9 1.1  PROT 6.6 6.0* 6.0* 5.7* 5.4*  ALBUMIN 2.8* 2.6* 2.5* 2.4* 2.4*   Recent Labs  Lab 02/04/21 1150  LIPASE 30  AMYLASE 356*   Recent Labs  Lab 02/04/21 0617  AMMONIA 42*   Coagulation Profile: Recent Labs  Lab 02/04/21 0724  INR 1.2   Cardiac Enzymes: Recent Labs  Lab 02/05/21 1453 02/06/21 0219 02/07/21 0126 02/08/21 0104 02/09/21 0112  CKTOTAL >50,000* >50,000* >50,000* 44,314* 32,759*  CKMB 178.4*  --   --   --   --    BNP (last 3 results) No results for input(s): PROBNP in the last 8760 hours. HbA1C: No results for input(s): HGBA1C in the last 72 hours. CBG: Recent Labs  Lab 02/04/21 1337 02/04/21 1935 02/04/21 2332 02/05/21 0323 02/06/21 0635  GLUCAP 109* 138* 132* 125* 124*   Lipid Profile: No results for input(s): CHOL, HDL, LDLCALC, TRIG, CHOLHDL, LDLDIRECT in the last 72 hours. Thyroid Function Tests: No results for input(s): TSH, T4TOTAL, FREET4, T3FREE, THYROIDAB in the last 72 hours. Anemia Panel: No results for input(s): VITAMINB12, FOLATE, FERRITIN, TIBC, IRON, RETICCTPCT in the last 72 hours. Sepsis Labs: Recent Labs  Lab 02/04/21 0915 02/04/21 1150 02/04/21 1437 02/04/21 2139 02/05/21 1453  PROCALCITON   --  8.88  --   --   --   LATICACIDVEN 6.3*  --  2.7* 2.5* 1.5    Recent Results (from the past 240 hour(s))  Culture, blood (Routine X 2) w Reflex to ID Panel     Status: None   Collection Time: 02/04/21  6:50 AM   Specimen: BLOOD  Result Value Ref Range Status   Specimen Description BLOOD RIGHT ANTECUBITAL  Final   Special Requests   Final    BOTTLES DRAWN AEROBIC ONLY Blood Culture results may not be optimal due to an inadequate volume of blood received in culture bottles   Culture   Final    NO GROWTH 5 DAYS Performed at [facility], [address] [place], [place] [address]    Report Status 02/09/2021 FINAL  Final  Culture, blood (Routine X 2) w Reflex to ID Panel     Status: None   Collection Time: 02/04/21  7:55 AM   Specimen: BLOOD  Result Value Ref Range Status   Specimen Description BLOOD LEFT ANTECUBITAL  Final   Special Requests   Final    BOTTLES DRAWN AEROBIC AND ANAEROBIC Blood Culture results may not be optimal due to an inadequate volume of blood received in culture bottles   Culture   Final    NO GROWTH 5 DAYS Performed at [facility], [address] [place], [place] [address]    Report Status 02/09/2021 FINAL  Final  Urine Culture     Status: None   Collection Time: 02/04/21  8:25 AM   Specimen: Urine, Random  Result Value Ref Range Status   Specimen Description URINE, RANDOM  Final   Special Requests NONE  Final   Culture   Final    NO GROWTH Performed at [facility], [address], [place], [place] [address]    Report Status 02/05/2021 FINAL  Final  Resp Panel by RT-PCR (Flu A&B, Covid) Nasopharyngeal Swab     Status: None   Collection Time: 02/04/21  9:25 AM   Specimen: Nasopharyngeal Swab; Nasopharyngeal(NP) swabs in vial transport medium  Result Value Ref Range Status   SARS Coronavirus 2 by RT PCR NEGATIVE NEGATIVE Final    Comment: (NOTE) SARS-CoV-2 target nucleic acids are NOT DETECTED.  The SARS-CoV-2 RNA is generally  detectable in upper respiratory specimens during the acute phase of infection. The lowest concentration of SARS-CoV-2 viral copies this assay can detect is 138 copies/mL. A negative result does not preclude SARS-Cov-2 infection and should  not be used as the sole basis for treatment or other patient management decisions. A negative result may occur with  improper specimen collection/handling, submission of specimen other than nasopharyngeal swab, presence of viral mutation(s) within the areas targeted by this assay, and inadequate number of viral copies(<138 copies/mL). A negative result must be combined with clinical observations, patient history, and epidemiological information. The expected result is Negative.  Fact Sheet for Patients:  BloggerCourse.com  Fact Sheet for Healthcare Providers:  SeriousBroker.it  This test is no t yet approved or cleared by the [place] FDA and  has been authorized for detection and/or diagnosis of SARS-CoV-2 by FDA under an Emergency Use Authorization (EUA). This EUA will remain  in effect (meaning this test can be used) for the duration of the COVID-19 declaration under Section 564(b)(1) of the Act, 21 U.S.C.section 360bbb-3(b)(1), unless the authorization is terminated  or revoked sooner.       Influenza A by PCR NEGATIVE NEGATIVE Final   Influenza B by PCR NEGATIVE NEGATIVE Final    Comment: (NOTE) The Xpert Xpress SARS-CoV-2/FLU/RSV plus assay is intended as an aid in the diagnosis of influenza from Nasopharyngeal swab specimens and should not be used as a sole basis for treatment. Nasal washings and aspirates are unacceptable for Xpert Xpress SARS-CoV-2/FLU/RSV testing.  Fact Sheet for Patients: BloggerCourse.com  Fact Sheet for Healthcare Providers: SeriousBroker.it  This test is not yet approved or cleared by the [place] FDA  and has been authorized for detection and/or diagnosis of SARS-CoV-2 by FDA under an Emergency Use Authorization (EUA). This EUA will remain in effect (meaning this test can be used) for the duration of the COVID-19 declaration under Section 564(b)(1) of the Act, 21 U.S.C. section 360bbb-3(b)(1), unless the authorization is terminated or revoked.  Performed at [facility], [address], [place], [place] [address]   MRSA PCR Screening     Status: None   Collection Time: 02/04/21  1:37 PM   Specimen: Nasal Mucosa; Nasopharyngeal  Result Value Ref Range Status   MRSA by PCR NEGATIVE NEGATIVE Final    Comment:        The GeneXpert MRSA Assay (FDA approved for NASAL specimens only), is one component of a comprehensive MRSA colonization surveillance program. It is not intended to diagnose MRSA infection nor to guide or monitor treatment for MRSA infections. Performed at [facility], [address] [place], [place] [address]   Blood Culture (routine x 2)     Status: None   Collection Time: 02/04/21  2:22 PM   Specimen: BLOOD RIGHT HAND  Result Value Ref Range Status   Specimen Description BLOOD RIGHT HAND  Final   Special Requests   Final    BOTTLES DRAWN AEROBIC ONLY Blood Culture results may not be optimal due to an inadequate volume of blood received in culture bottles   Culture   Final    NO GROWTH 5 DAYS Performed at [facility], [address] [place], [place] [address]    Report Status 02/09/2021 FINAL  Final  Blood Culture (routine x 2)     Status: None   Collection Time: 02/04/21  2:37 PM   Specimen: BLOOD LEFT HAND  Result Value Ref Range Status   Specimen Description BLOOD LEFT HAND  Final   Special Requests   Final    BOTTLES DRAWN AEROBIC ONLY Blood Culture results may not be optimal due to an inadequate volume of blood received in  culture bottles   Culture   Final    NO GROWTH 5 DAYS Performed at [facility], [address] [place], [place] [address]    Report Status 02/09/2021 FINAL  Final     Radiology Studies: MR LUMBAR SPINE WO CONTRAST  Result Date: 02/07/2021 CLINICAL DATA:  Low back pain with left foot drop. EXAM: MRI LUMBAR SPINE WITHOUT CONTRAST TECHNIQUE: Multiplanar, multisequence MR imaging of the lumbar spine was performed. No intravenous contrast was administered. COMPARISON:  Lumbar radiographs 09/29/2007 FINDINGS: Segmentation: Motion degraded study. The patient was not able to complete the study and axial images were not obtained. Normal segmentation. Alignment:  Normal Vertebrae:  Negative for fracture or mass. Conus medullaris and cauda equina: Conus not well visualized. Paraspinal and other soft tissues: Extensive edema is present in the right paraspinous muscles without fluid collection. Left muscles appear normal. Disc levels: L1-2: Negative L2-3: Negative L3-4: Disc degeneration with disc bulging and associated spurring. No significant stenosis L4-5: Disc degeneration with disc bulging and associated endplate spurring. Mild to moderate subarticular stenosis bilaterally L5-S1: Mild disc bulging.  Mild subarticular stenosis bilaterally. IMPRESSION: Incomplete and motion degraded study. The patient was not able to tolerate axial images There is extensive edema in the right paraspinous muscle suggesting myositis. This could be infectious or posttraumatic. Mild lumbar degenerative disc disease at L3-4, L4-5, L5-S1. Electronically Signed   By: Marlan Palau M.D.   On: 02/07/2021 15:13     LOS: 5 days   Lanae Boast, MD [facility]  02/09/2021, 7:59 AM

## 2021-02-09 NOTE — Progress Notes (Signed)
Mobility Specialist: Progress Note   02/09/21 1431  Mobility  Activity Ambulated in room  Level of Assistance Minimal assist, patient does 75% or more  Assistive Device Front wheel walker  Distance Ambulated (ft) 10 ft  Mobility Response Tolerated well  Mobility performed by Mobility specialist  Bed Position Chair  $Mobility charge 1 Mobility   Assisted PTA Carly in ambulating pt. Pt to [facility] before mobility with minimal BM. Pt c/o lower back pain prior to getting OOB. Pt walked to the door and needed to sit down due to feeling fatigued. Pt rolled back in recliner and is set up to eat lunch.   [facility] Eddy Liszewski Mobility Specialist Mobility Specialist Phone: [phone]

## 2021-02-10 DIAGNOSIS — N179 Acute kidney failure, unspecified: Secondary | ICD-10-CM | POA: Diagnosis not present

## 2021-02-10 LAB — CBC
HCT: 37.6 % — ABNORMAL LOW (ref 39.0–52.0)
Hemoglobin: 12.9 g/dL — ABNORMAL LOW (ref 13.0–17.0)
MCH: 33.6 pg (ref 26.0–34.0)
MCHC: 34.3 g/dL (ref 30.0–36.0)
MCV: 97.9 fL (ref 80.0–100.0)
Platelets: 179 10*3/uL (ref 150–400)
RBC: 3.84 MIL/uL — ABNORMAL LOW (ref 4.22–5.81)
RDW: 11.6 % (ref 11.5–15.5)
WBC: 16.6 10*3/uL — ABNORMAL HIGH (ref 4.0–10.5)
nRBC: 0 % (ref 0.0–0.2)

## 2021-02-10 LAB — COMPREHENSIVE METABOLIC PANEL
ALT: 215 U/L — ABNORMAL HIGH (ref 0–44)
AST: 451 U/L — ABNORMAL HIGH (ref 15–41)
Albumin: 2.3 g/dL — ABNORMAL LOW (ref 3.5–5.0)
Alkaline Phosphatase: 46 U/L (ref 38–126)
Anion gap: 10 (ref 5–15)
BUN: 14 mg/dL (ref 6–20)
CO2: 25 mmol/L (ref 22–32)
Calcium: 8.4 mg/dL — ABNORMAL LOW (ref 8.9–10.3)
Chloride: 102 mmol/L (ref 98–111)
Creatinine, Ser: 1.2 mg/dL (ref 0.61–1.24)
GFR, Estimated: >60 mL/min (ref >60)
Glucose, Bld: 109 mg/dL — ABNORMAL HIGH (ref 70–99)
Potassium: 4.1 mmol/L (ref 3.5–5.1)
Sodium: 137 mmol/L (ref 135–145)
Total Bilirubin: 1.4 mg/dL — ABNORMAL HIGH (ref 0.3–1.2)
Total Protein: 5.3 g/dL — ABNORMAL LOW (ref 6.5–8.1)

## 2021-02-10 LAB — CK: Total CK: 20223 U/L — ABNORMAL HIGH (ref 49–397)

## 2021-02-10 LAB — PROCALCITONIN: Procalcitonin: 0.45 ng/mL

## 2021-02-10 NOTE — Progress Notes (Signed)
Mobility Specialist - Progress Note   02/10/21 1242  Mobility  Activity Ambulated in room  Level of Assistance Standby assist, set-up cues, supervision of patient - no hands on  Assistive Device Front wheel walker  Distance Ambulated (ft) 15 ft  Mobility Response Tolerated well  Mobility performed by Mobility specialist  $Mobility charge 1 Mobility   Pre-mobility: 75 HR, 155/100 BP Post-mobility: 78 HR, 162/97 BP  Pt asx throughout ambulation. Pt ambulated around bed to recliner. Pt left in chair w/ call bell in reach, family member in room.   Mamie Levers Mobility Specialist Mobility Specialist Phone: [phone]

## 2021-02-10 NOTE — Progress Notes (Signed)
RT placed pt on CPAP dreamstation settings of max 12 min 4 w/no oxygen bled into the system. Pt respiratory status stable w/no distress noted at this time. RT will continue to monitor.

## 2021-02-10 NOTE — Progress Notes (Signed)
Physical Therapy Treatment Patient Details Name: Shane Guzman MRN: [id number] DOB: 12-01-1976 Today's Date: 02/10/2021    History of Present Illness 45 year old man with obesity, hypertension, history of sigmoid diverticulitis (perforation 2012), OSA on CPAP with marginal compliance, tobacco and alcohol use.  He was brought to the ED 2/18 after being unresponsive and difficult to arouse in the early morning. Pt with pinpoint pupils, agonal breathing and foaming at the mouth, EMS with brief stent of CPR, came to with narcan. UDS negative.    PT Comments    Pt received up in chair, agreeable to therapy session with good participation and tolerance for mobility. Pt able to progress gait distance to 18ft + 18ft using RW and minA and chair follow for safety and rest break between, pt remains somewhat impulsive and needs cues for line awareness/safet with transfers. Reviewed supine HEP handout, pt will need reinforcement for compliance. Pt reports moderate fatigue at end of session but mainly limited due to severe LLE/back pain. Will plan to progress gait distance/standing tolerance next session. Pt continues to benefit from PT services to progress toward functional mobility goals. Continue to recommend CIR.  Follow Up Recommendations  CIR     Equipment Recommendations       Recommendations for Other Services Rehab consult     Precautions / Restrictions Precautions Precautions: Fall Precaution Comments: LLE edema/numbness/foot drop Restrictions Weight Bearing Restrictions: Yes RLE Weight Bearing: Weight bearing as tolerated LLE Weight Bearing: Weight bearing as tolerated Other Position/Activity Restrictions: "WBAT with functional AFO" per ortho MD note 2/20    Mobility  Bed Mobility Overal bed mobility: Needs Assistance Bed Mobility: Rolling;Sit to Sidelying Rolling: Supervision       Sit to sidelying: Min assist General bed mobility comments: cues for log rolling for  back/abdominal comfort, heavy use of bed features/rail, minA for BLE    Transfers Overall transfer level: Needs assistance Equipment used: Rolling walker (2 wheeled) Transfers: Sit to/from Stand Sit to Stand: +2 safety/equipment;Min guard         General transfer comment: from chair/EOB, pt impulsive to stand and heavy BUE reliance on RW, cues for safety  Ambulation/Gait Ambulation/Gait assistance: Min assist Gait Distance (Feet): 30 Feet (46ft, seated break, 48ft) Assistive device: Rolling walker (2 wheeled) Gait Pattern/deviations: Step-to pattern;Decreased stance time - left;Decreased dorsiflexion - left;Decreased weight shift to left;Antalgic;Wide base of support Gait velocity: slowed   General Gait Details: close chair follow, fair use of RW and able to place L forefoot on floor but still has antalgic gait due to moderate LLE pain; no LOB but fatigues quickly   Stairs             Wheelchair Mobility    Modified Rankin (Stroke Patients Only)       Balance Overall balance assessment: Needs assistance Sitting-balance support: Feet supported;Bilateral upper extremity supported Sitting balance-Leahy Scale: Fair Sitting balance - Comments: Supervision, no LOB U UE support     Standing balance-Leahy Scale: Poor Standing balance comment: requires heavy UE support on RW                            Cognition Arousal/Alertness: Awake/alert Behavior During Therapy: WFL for tasks assessed/performed;Impulsive Overall Cognitive Status: Impaired/Different from baseline Area of Impairment: Memory;Safety/judgement;Problem solving                     Memory: Decreased short-term memory   Safety/Judgement: Decreased awareness of safety;Decreased awareness  of deficits   Problem Solving: Requires verbal cues General Comments: somewhat impulsive to get up, cues for safety and pacing, pulling up on RW with poor carryover of cues to push from bed/chair       Exercises General Exercises - Lower Extremity Ankle Circles/Pumps: AAROM;Left;AROM;Right;10 reps;Seated (AA on LLE, AROM on RLE) Quad Sets: AROM;Both;Supine;5 reps    General Comments General comments (skin integrity, edema, etc.): continue to c/o LLE pain, LLE elevated for edema, tends to L eversion and reinforced importance of keeping legs straight/close together at rest      Pertinent Vitals/Pain Pain Assessment: 0-10 Pain Score: 9  Pain Location: low back pain <LLE Pain Descriptors / Indicators: Grimacing;Guarding;Discomfort;Tingling Pain Intervention(s): Monitored during session;Repositioned;Premedicated before session (LLE elevated at end of session)    Home Living                      Prior Function            PT Goals (current goals can now be found in the care plan section) Acute Rehab PT Goals Patient Stated Goal: decrease pain PT Goal Formulation: With patient Time For Goal Achievement: 02/20/21 Potential to Achieve Goals: Good Progress towards PT goals: Progressing toward goals    Frequency    Min 4X/week      PT Plan Current plan remains appropriate    Co-evaluation              AM-PAC PT "6 Clicks" Mobility   Outcome Measure  Help needed turning from your back to your side while in a flat bed without using bedrails?: A Little Help needed moving from lying on your back to sitting on the side of a flat bed without using bedrails?: A Little Help needed moving to and from a bed to a chair (including a wheelchair)?: A Little Help needed standing up from a chair using your arms (e.g., wheelchair or bedside chair)?: A Little Help needed to walk in hospital room?: A Little Help needed climbing 3-5 steps with a railing? : Total 6 Click Score: 16    End of Session Equipment Utilized During Treatment: Gait belt Activity Tolerance: Patient tolerated treatment well;Patient limited by pain Patient left: in bed;with bed alarm set;with call  bell/phone within reach (heels floated, LLE elevated, NT entering room) Nurse Communication: Mobility status PT Visit Diagnosis: Unsteadiness on feet (R26.81);Muscle weakness (generalized) (M62.81);Difficulty in walking, not elsewhere classified (R26.2)     Time: 1950-9326 PT Time Calculation (min) (ACUTE ONLY): 20 min  Charges:  $Gait Training: 8-22 mins                     Javoris Star P., PTA Acute Rehabilitation Services Pager: [phone] Office: [phone]   Dorathy Kinsman Kimie Pidcock 02/10/2021, 10:24 AM

## 2021-02-10 NOTE — Progress Notes (Signed)
IP rehab admissions - Noted from MD that patient not medically ready for CIR today.  We did receive authorization for inpatient rehab admission once patient is medically ready.  My partner will follow up tomorrow for medical readiness.  Call for questions.  [phone]

## 2021-02-10 NOTE — Progress Notes (Signed)
PROGRESS NOTE    Shane Guzman  LNL:[id number] DOB: April 04, 1976 DOA: 02/04/2021 PCP: Gaspar Garbe, MD   Chief Complaint  Patient presents with  . Altered Mental Status    Brief Narrative: 45 year old male with morbid obesity/OSA on CPAP with marginal compliance, tobacco and alcohol abuse, hypertension, chronic back pain, history of sigmoid diverticulitis with perforation in 2012 to the ED on 2/18 after being unresponsive and difficult to arouse in the early morning at home was diaphoretic with foaming of the mouth but no witnessed seizure activity.Apparently he has not been using CPAP.  Was brought by EMS, had a pulse with agonal respiratory pattern and hypoxia status post Narcan with some improvement in mental status with agitation then received Ativan on arrival to the ED and 6 L nasal cannula oxygen, was hypotensive with lactic acidosis 8.2, renal failure, metabolic and respiratory acidosis, internal positivity for drug screen was negative, etoh 84 lab with rhabdomyolysis leukocytosis transaminitis renal failure hyperkalemia acidosis. CT abdomen with mild bilateral posterior basilar subsegmental atelectasis versus infiltrate but no hydronephrosis, normal liver and pancreas with sigmoid diverticulosis with some focal wall thickening in the proximal sigmoid and minimal surrounding inflammatory change, negative CT head/CT C-spine. Empirically placed on vancomycin and cefepime for possible septic shock, with aggressive IV fluid hydration. Patient was admitted to critical care-treated for acute metabolic encephalopathy/hypoxic respiratory failure/rhabdomyolysis with severe AKI hyperkalemia metabolic acidosis.  Seen by nephrology. Patient has been also complaining of left lower leg weakness/foot drop and edema pain diminished sensation-seen by Dr. Jerrel Ivory orthopedics 2/20 no evidence of compartment syndrome blood flow likely from peroneal nerve compression advised nerve conduction study. 2/21-He  remembers hurting on the back ( can't' remember where else" remembers waking up in the hospital and since then c/o back pain and can't feel the left leg and cannot move it and it is hurting too.reports he has chronic back pain he sees which is unchanged. He is able to flex his left knee.  MRI lumbar spine was done limited with motion artifact but shows "Extensive edema is present in the right paraspinous muscles without fluid collection" His renal function has stabilized ,is putting out urine.He has had left foot drop and seems to be improving.  Subjective:  Able to move his rt ankle some today, did ptot. Rt thigh swollen and painful still Foley+ Wbc going up but no fever. Feels better overall Was having a lot of spasm and improving on antispasmodics.  Assessment & Plan:  Acute kidney injury from combination of volume contraction/pigment nephropathy from rhabdomyolysis Hyperkalemia due to AKI/Anion gap with a severe acidosis due to AKI Renal function stabilized, still having polyuria.  Will discuss with radiology will remove Foley catheter start on IV fluids slowly wean off.  Increase oral intake.  Ck down in 22k.Avoid entersto/ACEI-ARBs at least for 4 weeks. Recent Labs  Lab 02/06/21 0219 02/07/21 0126 02/08/21 0104 02/09/21 0112 02/10/21 0206  BUN 24* 19 16 13 14   CREATININE 1.68* 1.30* 1.18 1.11 1.20   Rhabdomyolysis-likely from prolonged immobilization, MRI back limited due to motion artifact but shows right paraspinous myositis-no fluid collection. Reportedly his one leg was hanging from bed at the edematous left thigh. CK > 50K for few days>32k>20k- cont gentle ivf. Blood cx neg from 2/18.Continue pain control  Hypokalemia-resolved.    Left foot drop left peroneal nerve compression,no evidence of compartment syndrome seen by Dr. 3/18 from orthopedic.  MRI lumbar spine limited but shows right-sided paraspinous myositis.Advised AFO to support left foot until  recovery and needs NCS  and f/u w/ Dr.  Ethelene Hal from orthopedic.  He does have some movement on his left ankle  And foot drop seems to be improving.  Leukocytosis: Patient has been on Augmentin for possible aspiration.  WBC count trending up.  Monitor closely no evidence of fever. Monitor Procal level. Recent Labs  Lab 02/04/21 0617 02/04/21 0915 02/04/21 1150 02/04/21 1437 02/04/21 2139 02/05/21 1453 02/06/21 0219 02/07/21 0126 02/08/21 0104 02/09/21 0112 02/10/21 0206  WBC 15.0*  --   --   --   --  13.1* 13.6* 13.7* 12.4* 14.6* 16.6*  LATICACIDVEN 8.2* 6.3*  --  2.7* 2.5* 1.5  --   --   --   --   --   PROCALCITON  --   --  8.88  --   --   --   --   --   --   --   --    Acute hypoxic and hypercapnic respiratory failure/OSA with marginal compliance w/ CPAP. with possible aspiration pneumonia/pneumonitis on admission: On admission needing nonrebreather did not require intubation.  He is off oxygen.  Continue bedtime CPAP.Antibiotic has been changed to Augmentin for 5 days total.Blood culture urine culture no growth so far.  WBC still up we will monitor.  Heavy alcohol abuse- he reports he drank 1 pint of liquor before going to bed on the night before admission.  He reports "I need to quit: Counseled to quit.  Acute metabolic encephalopathy, suspect etoh induced, was found unresponsive at home.CT head unremarkable.  His mental status stable and normal at this time.   Abnormal LFTs/transaminitis due to rhabdomyolysis.  LFTs downtrending. AST/ALT peaked 1500/50. Acute viral hepatitis panel was unremarkable. Cont to monitor Recent Labs  Lab 02/04/21 0724 02/04/21 1202 02/06/21 0219 02/07/21 0126 02/08/21 0104 02/09/21 0112 02/10/21 0206  AST  --    < > 1,243* 938* 783* 659* 451*  ALT  --    < > 428* 335* 292* 255* 215*  ALKPHOS  --    < > 50 50 54 47 46  BILITOT  --    < > 1.1 1.0 0.9 1.1 1.4*  PROT  --    < > 6.0* 6.0* 5.7* 5.4* 5.3*  ALBUMIN  --    < > 2.6* 2.5* 2.4* 2.4* 2.3*  INR 1.2  --   --   --    --   --   --    < > = values in this interval not displayed.   Hypertension: BP on higher side. holding ARB/diuretics.continue metoprolol.  Morbid Obesity w BMI > 39: OP follow-up and weight loss  Focal wall thickening incidentally noted on the CT abdomen on proximal sigmoid colon-uncertain if acute diverticulitis or sequelae of previous episode no abdominal pain.  Insomnia added trazodone at bedtime per request  Nutrition: Diet Order            Diet Heart Room service appropriate? Yes with Assist; Fluid consistency: Thin  Diet effective now               Pt's Body mass index is 40.39 kg/m.  DVT prophylaxis: heparin injection 5,000 Units Start: 02/04/21 1400 Code Status:   Code Status: Full Code  Family Communication: plan of care discussed with patient at bedside. I had updated his significant other 2/22 and hsi grandmother  Status is: Inpatient Remains inpatient appropriate because:Ongoing diagnostic testing needed not appropriate for outpatient work up, IV treatments appropriate due to intensity  of illness or inability to take PO and Inpatient level of care appropriate due to severity of illness  Dispo: The patient is from:apartment with girl friend and son              Anticipated d/c is to:CIR. Continue to work with PT OT              Anticipated d/c date is:2 days              Patient currently is not medically stable to d/c.   Difficult to place patient No  Consultants:see note.  Procedures:see note.  Unresulted Labs (From admission, onward)          Start     Ordered   02/08/21 0500  CK  Daily,   R     Question:  Specimen collection method  Answer:  Lab=Lab collect   02/07/21 0728        Culture/Microbiology    Component Value Date/Time   SDES BLOOD LEFT HAND 02/04/2021 1437   SPECREQUEST  02/04/2021 1437    BOTTLES DRAWN AEROBIC ONLY Blood Culture results may not be optimal due to an inadequate volume of blood received in culture bottles   CULT   02/04/2021 1437    NO GROWTH 5 DAYS Performed at [facility], [address] [place], [place] [address]    REPTSTATUS 02/09/2021 FINAL 02/04/2021 1437    Other culture-see note  Medications: Scheduled Meds: . amoxicillin-clavulanate  1 tablet Oral Q12H  . Chlorhexidine Gluconate Cloth  6 each Topical Daily  . famotidine  10 mg Oral Daily  . heparin  5,000 Units Subcutaneous Q8H  . influenza vac split quadrivalent PF  0.5 mL Intramuscular Tomorrow-1000  . metoprolol tartrate  12.5 mg Oral BID  . polyethylene glycol  17 g Oral Daily  . senna-docusate  1 tablet Oral BID   Continuous Infusions: . sodium chloride 125 mL/hr at 02/10/21 0422  . sodium chloride 10 mL/hr at 02/05/21 1100    Antimicrobials: Anti-infectives (From admission, onward)   Start     Dose/Rate Route Frequency Ordered Stop   02/05/21 1245  amoxicillin-clavulanate (AUGMENTIN) 875-125 MG per tablet 1 tablet        1 tablet Oral Every 12 hours 02/05/21 1157     02/05/21 0800  ceFEPIme (MAXIPIME) 2 g in sodium chloride 0.9 % 100 mL IVPB  Status:  Discontinued        2 g 200 mL/hr over 30 Minutes Intravenous Every 24 hours 02/04/21 0813 02/05/21 1157   02/04/21 0812  vancomycin variable dose per unstable renal function (pharmacist dosing)  Status:  Discontinued         Does not apply See admin instructions 02/04/21 0812 02/05/21 1157   02/04/21 0730  ceFEPIme (MAXIPIME) 2 g in sodium chloride 0.9 % 100 mL IVPB        2 g 200 mL/hr over 30 Minutes Intravenous  Once 02/04/21 0725 02/04/21 0842   02/04/21 0730  metroNIDAZOLE (FLAGYL) IVPB 500 mg        500 mg 100 mL/hr over 60 Minutes Intravenous  Once 02/04/21 0725 02/04/21 0932   02/04/21 0730  vancomycin (VANCOREADY) IVPB 2000 mg/400 mL        2,000 mg 200 mL/hr over 120 Minutes Intravenous  Once 02/04/21 0725 02/04/21 1122     Objective: Vitals: Today's Vitals   02/10/21 0359 02/10/21 0500 02/10/21 0559 02/10/21 0730  BP:    (!) 161/100  Pulse:  78   81  Resp:  19  13  Temp:    97.7 F (36.5 C)  TempSrc:    Axillary  SpO2:  100%  100%  Weight:  106.7 kg    Height:      PainSc: Asleep  10-Worst pain ever 9     Intake/Output Summary (Last 24 hours) at 02/10/2021 0841 Last data filed at 02/10/2021 [id number] Gross per 24 hour  Intake 2938.97 ml  Output 7350 ml  Net -4411.03 ml   Filed Weights   02/07/21 0448 02/09/21 0358 02/10/21 0500  Weight: 105.6 kg 105.4 kg 106.7 kg   Weight change: 1.331 kg  Intake/Output from previous day: 02/23 0701 - 02/24 0700 In: 2939 [P.O.:240; I.V.:2699] Out: 7350 [Urine:7350] Intake/Output this shift: No intake/output data recorded. Filed Weights   02/07/21 0448 02/09/21 0358 02/10/21 0500  Weight: 105.6 kg 105.4 kg 106.7 kg    Examination: General exam: AAOx3,NAD, weak appearing. HEENT:Oral mucosa moist, Ear/Nose WNL grossly, dentition normal. Respiratory system: bilaterally clear,no wheezing or crackles,no use of accessory muscle Cardiovascular system: S1 & S2 +, No JVD. Gastrointestinal system: Abdomen soft, NT,ND, BS+ Nervous System:Alert, awake, moving extremities and grossly nonfocal Extremities: Left thigh swollen, left foot drop but improving from before , distal peripheral pulses palpable.  Skin: No rashes,no icterus. MSK: Normal muscle bulk,tone, power  Data Reviewed: I have personally reviewed following labs and imaging studies CBC: Recent Labs  Lab 02/04/21 0617 02/04/21 0630 02/06/21 0219 02/07/21 0126 02/08/21 0104 02/09/21 0112 02/10/21 0206  WBC 15.0*   < > 13.6* 13.7* 12.4* 14.6* 16.6*  NEUTROABS 11.7*  --   --  10.8*  --   --   --   HGB 17.0   < > 15.2 13.6 13.3 13.2 12.9*  HCT 54.3*   < > 45.0 41.1 39.1 36.6* 37.6*  MCV 105.6*   < > 99.1 100.5* 100.5* 96.6 97.9  PLT 207   < > 135* 126* 138* 160 179   < > = values in this interval not displayed.   Basic Metabolic Panel: Recent Labs  Lab 02/04/21 1202 02/04/21 2139 02/05/21 0346 02/05/21 1453  02/06/21 0219 02/07/21 0126 02/08/21 0104 02/09/21 0112 02/10/21 0206  NA 142   < > 135   < > 132* 135 134* 135 137  K 6.3*   < > 4.0   < > 3.9 3.7 3.3* 3.8 4.1  CL 107   < > 95*   < > 95* 98 100 101 102  CO2 15*   < > 26   < > 26 29 25 25 25   GLUCOSE 87   < > 135*   < > 124* 115* 141* 104* 109*  BUN 20   < > 25*   < > 24* 19 16 13 14   CREATININE 3.22*   < > 2.16*   < > 1.68* 1.30* 1.18 1.11 1.20  CALCIUM 8.3*   < > 7.8*   < > 8.0* 8.2* 8.2* 8.4* 8.4*  MG 2.5*  --  2.1  --   --   --   --   --   --   PHOS 5.1*  --  4.8*  --   --   --   --   --   --    < > = values in this interval not displayed.   GFR: Estimated Creatinine Clearance: 86 mL/min (by C-G formula based on SCr of 1.2 mg/dL). Liver Function Tests: Recent Labs  Lab 02/06/21 0219 02/07/21 0126 02/08/21 0104 02/09/21 0112 02/10/21 0206  AST 1,243* 938* 783* 659* 451*  ALT 428* 335* 292* 255* 215*  ALKPHOS 50 50 54 47 46  BILITOT 1.1 1.0 0.9 1.1 1.4*  PROT 6.0* 6.0* 5.7* 5.4* 5.3*  ALBUMIN 2.6* 2.5* 2.4* 2.4* 2.3*   Recent Labs  Lab 02/04/21 1150  LIPASE 30  AMYLASE 356*   Recent Labs  Lab 02/04/21 0617  AMMONIA 42*   Coagulation Profile: Recent Labs  Lab 02/04/21 0724  INR 1.2   Cardiac Enzymes: Recent Labs  Lab 02/05/21 1453 02/06/21 0219 02/07/21 0126 02/08/21 0104 02/09/21 0112 02/10/21 0206  CKTOTAL >50,000* >50,000* >50,000* 74,128* 32,759* 20,223*  CKMB 178.4*  --   --   --   --   --    BNP (last 3 results) No results for input(s): PROBNP in the last 8760 hours. HbA1C: No results for input(s): HGBA1C in the last 72 hours. CBG: Recent Labs  Lab 02/04/21 1337 02/04/21 1935 02/04/21 2332 02/05/21 0323 02/06/21 0635  GLUCAP 109* 138* 132* 125* 124*   Lipid Profile: No results for input(s): CHOL, HDL, LDLCALC, TRIG, CHOLHDL, LDLDIRECT in the last 72 hours. Thyroid Function Tests: No results for input(s): TSH, T4TOTAL, FREET4, T3FREE, THYROIDAB in the last 72 hours. Anemia  Panel: No results for input(s): VITAMINB12, FOLATE, FERRITIN, TIBC, IRON, RETICCTPCT in the last 72 hours. Sepsis Labs: Recent Labs  Lab 02/04/21 0915 02/04/21 1150 02/04/21 1437 02/04/21 2139 02/05/21 1453  PROCALCITON  --  8.88  --   --   --   LATICACIDVEN 6.3*  --  2.7* 2.5* 1.5    Recent Results (from the past 240 hour(s))  Culture, blood (Routine X 2) w Reflex to ID Panel     Status: None   Collection Time: 02/04/21  6:50 AM   Specimen: BLOOD  Result Value Ref Range Status   Specimen Description BLOOD RIGHT ANTECUBITAL  Final   Special Requests   Final    BOTTLES DRAWN AEROBIC ONLY Blood Culture results may not be optimal due to an inadequate volume of blood received in culture bottles   Culture   Final    NO GROWTH 5 DAYS Performed at [facility], [address] [place], [place] [address]    Report Status 02/09/2021 FINAL  Final  Culture, blood (Routine X 2) w Reflex to ID Panel     Status: None   Collection Time: 02/04/21  7:55 AM   Specimen: BLOOD  Result Value Ref Range Status   Specimen Description BLOOD LEFT ANTECUBITAL  Final   Special Requests   Final    BOTTLES DRAWN AEROBIC AND ANAEROBIC Blood Culture results may not be optimal due to an inadequate volume of blood received in culture bottles   Culture   Final    NO GROWTH 5 DAYS Performed at [facility], [address], [place], [place] [address]    Report Status 02/09/2021 FINAL  Final  Urine Culture     Status: None   Collection Time: 02/04/21  8:25 AM   Specimen: Urine, Random  Result Value Ref Range Status   Specimen Description URINE, RANDOM  Final   Special Requests NONE  Final   Culture   Final    NO GROWTH Performed at [facility], [address] [place], [place] [address]    Report Status 02/05/2021 FINAL  Final  Resp Panel by RT-PCR (Flu A&B, Covid) Nasopharyngeal Swab  Status: None   Collection Time: 02/04/21  9:25 AM   Specimen: Nasopharyngeal Swab;  Nasopharyngeal(NP) swabs in vial transport medium  Result Value Ref Range Status   SARS Coronavirus 2 by RT PCR NEGATIVE NEGATIVE Final    Comment: (NOTE) SARS-CoV-2 target nucleic acids are NOT DETECTED.  The SARS-CoV-2 RNA is generally detectable in upper respiratory specimens during the acute phase of infection. The lowest concentration of SARS-CoV-2 viral copies this assay can detect is 138 copies/mL. A negative result does not preclude SARS-Cov-2 infection and should not be used as the sole basis for treatment or other patient management decisions. A negative result may occur with  improper specimen collection/handling, submission of specimen other than nasopharyngeal swab, presence of viral mutation(s) within the areas targeted by this assay, and inadequate number of viral copies(<138 copies/mL). A negative result must be combined with clinical observations, patient history, and epidemiological information. The expected result is Negative.  Fact Sheet for Patients:  BloggerCourse.com  Fact Sheet for Healthcare Providers:  SeriousBroker.it  This test is no t yet approved or cleared by the [place] FDA and  has been authorized for detection and/or diagnosis of SARS-CoV-2 by FDA under an Emergency Use Authorization (EUA). This EUA will remain  in effect (meaning this test can be used) for the duration of the COVID-19 declaration under Section 564(b)(1) of the Act, 21 U.S.C.section 360bbb-3(b)(1), unless the authorization is terminated  or revoked sooner.       Influenza A by PCR NEGATIVE NEGATIVE Final   Influenza B by PCR NEGATIVE NEGATIVE Final    Comment: (NOTE) The Xpert Xpress SARS-CoV-2/FLU/RSV plus assay is intended as an aid in the diagnosis of influenza from Nasopharyngeal swab specimens and should not be used as a sole basis for treatment. Nasal washings and aspirates are unacceptable for Xpert Xpress  SARS-CoV-2/FLU/RSV testing.  Fact Sheet for Patients: BloggerCourse.com  Fact Sheet for Healthcare Providers: SeriousBroker.it  This test is not yet approved or cleared by the [place] FDA and has been authorized for detection and/or diagnosis of SARS-CoV-2 by FDA under an Emergency Use Authorization (EUA). This EUA will remain in effect (meaning this test can be used) for the duration of the COVID-19 declaration under Section 564(b)(1) of the Act, 21 U.S.C. section 360bbb-3(b)(1), unless the authorization is terminated or revoked.  Performed at [facility], [address], [place], [place] [address]   MRSA PCR Screening     Status: None   Collection Time: 02/04/21  1:37 PM   Specimen: Nasal Mucosa; Nasopharyngeal  Result Value Ref Range Status   MRSA by PCR NEGATIVE NEGATIVE Final    Comment:        The GeneXpert MRSA Assay (FDA approved for NASAL specimens only), is one component of a comprehensive MRSA colonization surveillance program. It is not intended to diagnose MRSA infection nor to guide or monitor treatment for MRSA infections. Performed at [facility], [address] [place], [place] [address]   Blood Culture (routine x 2)     Status: None   Collection Time: 02/04/21  2:22 PM   Specimen: BLOOD RIGHT HAND  Result Value Ref Range Status   Specimen Description BLOOD RIGHT HAND  Final   Special Requests   Final    BOTTLES DRAWN AEROBIC ONLY Blood Culture results may not be optimal due to an inadequate volume of blood received in culture bottles   Culture   Final    NO GROWTH 5 DAYS Performed  at [facility], [address], [place], [place] [address]    Report Status 02/09/2021 FINAL  Final  Blood Culture (routine x 2)     Status: None   Collection Time: 02/04/21  2:37 PM   Specimen: BLOOD LEFT HAND  Result Value Ref Range Status   Specimen Description BLOOD LEFT HAND  Final    Special Requests   Final    BOTTLES DRAWN AEROBIC ONLY Blood Culture results may not be optimal due to an inadequate volume of blood received in culture bottles   Culture   Final    NO GROWTH 5 DAYS Performed at [facility], [address] [place], [place] [address]    Report Status 02/09/2021 FINAL  Final     Radiology Studies: No results found.   LOS: 6 days   Lanae Boast, MD [facility]  02/10/2021, 8:41 AM

## 2021-02-10 NOTE — Progress Notes (Signed)
OT Cancellation Note  Patient Details Name: Shane Guzman MRN: [id number] DOB: [birth year]   Cancelled Treatment:    Reason Eval/Treat Not Completed: Fatigue/lethargy limiting ability to participate;Other (comment) Pt reports having just gotten back in bed from being up in chair and working with PT. Will check back as time allows for OT session.  Lenor Derrick., COTA/L Acute Rehabilitation Services [phone]   [name] 02/10/2021, 11:05 AM

## 2021-02-11 ENCOUNTER — Inpatient Hospital Stay (HOSPITAL_COMMUNITY)
Admission: RE | Admit: 2021-02-11 | Discharge: 2021-02-28 | DRG: 945 | Disposition: A | Discharge status: Home Health | Payer: BC Managed Care – PPO | Source: Intra-hospital | Attending: Physical Medicine and Rehabilitation | Admitting: Physical Medicine and Rehabilitation

## 2021-02-11 ENCOUNTER — Encounter (HOSPITAL_COMMUNITY): Payer: Self-pay | Admitting: Physical Medicine and Rehabilitation

## 2021-02-11 DIAGNOSIS — M21372 Foot drop, left foot: Secondary | ICD-10-CM | POA: Diagnosis not present

## 2021-02-11 DIAGNOSIS — M6282 Rhabdomyolysis: Secondary | ICD-10-CM | POA: Diagnosis not present

## 2021-02-11 DIAGNOSIS — G4733 Obstructive sleep apnea (adult) (pediatric): Secondary | ICD-10-CM | POA: Diagnosis not present

## 2021-02-11 DIAGNOSIS — K5900 Constipation, unspecified: Secondary | ICD-10-CM | POA: Diagnosis not present

## 2021-02-11 DIAGNOSIS — M792 Neuralgia and neuritis, unspecified: Secondary | ICD-10-CM

## 2021-02-11 DIAGNOSIS — Z888 Allergy status to other drugs, medicaments and biological substances status: Secondary | ICD-10-CM | POA: Diagnosis not present

## 2021-02-11 DIAGNOSIS — R5381 Other malaise: Secondary | ICD-10-CM | POA: Diagnosis not present

## 2021-02-11 DIAGNOSIS — F17210 Nicotine dependence, cigarettes, uncomplicated: Secondary | ICD-10-CM | POA: Diagnosis not present

## 2021-02-11 DIAGNOSIS — K5903 Drug induced constipation: Secondary | ICD-10-CM

## 2021-02-11 DIAGNOSIS — N179 Acute kidney failure, unspecified: Secondary | ICD-10-CM | POA: Diagnosis not present

## 2021-02-11 DIAGNOSIS — Z6841 Body Mass Index (BMI) 40.0 and over, adult: Secondary | ICD-10-CM | POA: Diagnosis not present

## 2021-02-11 DIAGNOSIS — G5700 Lesion of sciatic nerve, unspecified lower limb: Secondary | ICD-10-CM | POA: Diagnosis not present

## 2021-02-11 DIAGNOSIS — K5730 Diverticulosis of large intestine without perforation or abscess without bleeding: Secondary | ICD-10-CM | POA: Diagnosis not present

## 2021-02-11 DIAGNOSIS — I10 Essential (primary) hypertension: Secondary | ICD-10-CM | POA: Diagnosis present

## 2021-02-11 DIAGNOSIS — G589 Mononeuropathy, unspecified: Secondary | ICD-10-CM | POA: Diagnosis present

## 2021-02-11 DIAGNOSIS — G5702 Lesion of sciatic nerve, left lower limb: Secondary | ICD-10-CM | POA: Diagnosis not present

## 2021-02-11 LAB — CBC
HCT: 37.7 % — ABNORMAL LOW (ref 39.0–52.0)
Hemoglobin: 12.8 g/dL — ABNORMAL LOW (ref 13.0–17.0)
MCH: 33.4 pg (ref 26.0–34.0)
MCHC: 34 g/dL (ref 30.0–36.0)
MCV: 98.4 fL (ref 80.0–100.0)
Platelets: 204 10*3/uL (ref 150–400)
RBC: 3.83 MIL/uL — ABNORMAL LOW (ref 4.22–5.81)
RDW: 11.6 % (ref 11.5–15.5)
WBC: 13.7 10*3/uL — ABNORMAL HIGH (ref 4.0–10.5)
nRBC: 0 % (ref 0.0–0.2)

## 2021-02-11 LAB — BASIC METABOLIC PANEL
Anion gap: 10 (ref 5–15)
BUN: 14 mg/dL (ref 6–20)
CO2: 27 mmol/L (ref 22–32)
Calcium: 8.4 mg/dL — ABNORMAL LOW (ref 8.9–10.3)
Chloride: 99 mmol/L (ref 98–111)
Creatinine, Ser: 1.19 mg/dL (ref 0.61–1.24)
GFR, Estimated: >60 mL/min (ref >60)
Glucose, Bld: 104 mg/dL — ABNORMAL HIGH (ref 70–99)
Potassium: 3.7 mmol/L (ref 3.5–5.1)
Sodium: 136 mmol/L (ref 135–145)

## 2021-02-11 LAB — PROCALCITONIN: Procalcitonin: 0.37 ng/mL

## 2021-02-11 LAB — CK: Total CK: 13468 U/L — ABNORMAL HIGH (ref 49–397)

## 2021-02-11 MED ORDER — METOPROLOL TARTRATE 25 MG PO TABS: 25.0000 mg | ORAL_TABLET | Freq: Two times a day (BID) | ORAL | Status: DC

## 2021-02-11 MED ORDER — POLYETHYLENE GLYCOL 3350 17 G PO PACK: 17.0000 g | PACK | Freq: Every day | ORAL | 0 refills | Status: AC

## 2021-02-11 MED ORDER — AMOXICILLIN-POT CLAVULANATE 875-125 MG PO TABS
1.0000 | ORAL_TABLET | Freq: Two times a day (BID) | ORAL | Status: AC
  Administered 2021-02-11 – 2021-02-13 (×4): 1 via ORAL
  Filled 2021-02-11 (×4): qty 1

## 2021-02-11 MED ORDER — METHOCARBAMOL 750 MG PO TABS
750.0000 mg | ORAL_TABLET | Freq: Four times a day (QID) | ORAL | Status: DC | PRN
  Administered 2021-02-11 – 2021-02-17 (×11): 750 mg via ORAL
  Filled 2021-02-11 (×11): qty 1

## 2021-02-11 MED ORDER — AMOXICILLIN-POT CLAVULANATE 875-125 MG PO TABS: 1.0000 | ORAL_TABLET | Freq: Two times a day (BID) | ORAL | 0 refills | Status: DC

## 2021-02-11 MED ORDER — TRAZODONE HCL 50 MG PO TABS
50.0000 mg | ORAL_TABLET | Freq: Every evening | ORAL | Status: DC | PRN
  Administered 2021-02-11 – 2021-02-22 (×8): 50 mg via ORAL
  Filled 2021-02-11 (×8): qty 1

## 2021-02-11 MED ORDER — FAMOTIDINE 20 MG PO TABS
10.0000 mg | ORAL_TABLET | Freq: Every day | ORAL | Status: DC
  Administered 2021-02-12 – 2021-02-28 (×17): 10 mg via ORAL
  Filled 2021-02-11 (×17): qty 1

## 2021-02-11 MED ORDER — METOPROLOL TARTRATE 12.5 MG HALF TABLET
12.5000 mg | ORAL_TABLET | Freq: Two times a day (BID) | ORAL | Status: DC
  Administered 2021-02-11 – 2021-02-28 (×34): 12.5 mg via ORAL
  Filled 2021-02-11 (×34): qty 1

## 2021-02-11 MED ORDER — SENNOSIDES-DOCUSATE SODIUM 8.6-50 MG PO TABS: 1.0000 | ORAL_TABLET | Freq: Two times a day (BID) | ORAL | Status: DC

## 2021-02-11 MED ORDER — OXYCODONE HCL 5 MG PO TABS: 5.0000 mg | ORAL_TABLET | ORAL | 0 refills | Status: DC | PRN

## 2021-02-11 MED ORDER — OXYCODONE HCL 5 MG PO TABS
5.0000 mg | ORAL_TABLET | ORAL | Status: DC | PRN
  Administered 2021-02-12 – 2021-02-13 (×8): 5 mg via ORAL
  Filled 2021-02-11 (×8): qty 1

## 2021-02-11 MED ORDER — SENNOSIDES-DOCUSATE SODIUM 8.6-50 MG PO TABS
1.0000 | ORAL_TABLET | Freq: Two times a day (BID) | ORAL | Status: DC
  Administered 2021-02-12 – 2021-02-28 (×32): 1 via ORAL
  Filled 2021-02-11 (×35): qty 1

## 2021-02-11 MED ORDER — SIMETHICONE 80 MG PO CHEW: 80.0000 mg | CHEWABLE_TABLET | Freq: Four times a day (QID) | ORAL | 0 refills | Status: DC | PRN

## 2021-02-11 MED ORDER — DOCUSATE SODIUM 100 MG PO CAPS
100.0000 mg | ORAL_CAPSULE | Freq: Two times a day (BID) | ORAL | Status: DC | PRN
Start: 2021-02-11 — End: 2021-02-28

## 2021-02-11 MED ORDER — HEPARIN SODIUM (PORCINE) 5000 UNIT/ML IJ SOLN: 5000.0000 [IU] | Freq: Three times a day (TID) | INTRAMUSCULAR | Status: DC

## 2021-02-11 MED ORDER — TRAZODONE HCL 50 MG PO TABS: 50.0000 mg | ORAL_TABLET | Freq: Every evening | ORAL | Status: DC | PRN

## 2021-02-11 MED ORDER — POLYETHYLENE GLYCOL 3350 17 G PO PACK
17.0000 g | PACK | Freq: Every day | ORAL | Status: DC
  Administered 2021-02-12 – 2021-02-28 (×10): 17 g via ORAL
  Filled 2021-02-11 (×14): qty 1

## 2021-02-11 MED ORDER — METHOCARBAMOL 750 MG PO TABS: 750.0000 mg | ORAL_TABLET | Freq: Four times a day (QID) | ORAL | Status: DC | PRN

## 2021-02-11 MED ORDER — BISACODYL 10 MG RE SUPP: 10.0000 mg | Freq: Every day | RECTAL | Status: DC | PRN

## 2021-02-11 MED ORDER — SIMETHICONE 80 MG PO CHEW: 80.0000 mg | CHEWABLE_TABLET | Freq: Four times a day (QID) | ORAL | Status: DC | PRN

## 2021-02-11 MED ORDER — HEPARIN SODIUM (PORCINE) 5000 UNIT/ML IJ SOLN
5000.0000 [IU] | Freq: Three times a day (TID) | INTRAMUSCULAR | Status: DC
  Administered 2021-02-11 – 2021-02-28 (×49): 5000 [IU] via SUBCUTANEOUS
  Filled 2021-02-11 (×49): qty 1

## 2021-02-11 MED ORDER — SODIUM CHLORIDE 0.9 % IV SOLN
INTRAVENOUS | Status: DC
  Administered 2021-02-12: 1000 mL via INTRAVENOUS

## 2021-02-11 NOTE — TOC Transition Note (Signed)
Transition of Care (TOC) - CM/SW Discharge Note Donn Pierini RN, BSN Transitions of Care Unit 4E- RN Case Manager See Treatment Team for direct phone #    Patient Details  Name: Shane Guzman MRN: [id number] Date of Birth: 29-Oct-1976  Transition of Care [facility]) CM/SW Contact:  Darrold Span, RN Phone Number: 02/11/2021, 1:02 PM   Clinical Narrative:    Notified this AM that INPT rehab has auth and bed available for admit today- Pt stable for transition to [facility] INPT rehab- plan to transition to INPT rehab later today. Pt/family agreeable.    Final next level of care: IP Rehab Facility Barriers to Discharge: No Barriers Identified   Patient Goals and CMS Choice Patient states their goals for this hospitalization and ongoing recovery are:: rehab   Choice offered to / list presented to : Patient  Discharge Placement                 INPT rehab- [facility]      Discharge Plan and Services     Post Acute Care Choice: IP Rehab          DME Arranged: N/A DME Agency: NA         HH Agency: NA        Social Determinants of Health (SDOH) Interventions     Readmission Risk Interventions Readmission Risk Prevention Plan 02/11/2021  Transportation Screening Complete  PCP or Specialist Appt within 5-7 Days Not Complete  Not Complete comments going to INPT rehab  Home Care Screening Complete  Medication Review (RN CM) Complete  Some recent data might be hidden

## 2021-02-11 NOTE — Progress Notes (Signed)
Inpatient Rehab Admissions Coordinator:  There is a bed available for pt to admit to CIR today. Dr. Dayna Barker is aware and in agreement. NSG and  TOC are aware.    Wolfgang Phoenix, MS, CCC-SLP Admissions Coordinator [phone]

## 2021-02-11 NOTE — Plan of Care (Signed)
  Problem: Education: Goal: Knowledge of General Education information will improve Description: Including pain rating scale, medication(s)/side effects and non-pharmacologic comfort measures 02/11/2021 1010 by Wayne Sever, RN Outcome: Adequate for Discharge 02/11/2021 1010 by Wayne Sever, RN Outcome: Progressing   Problem: Health Behavior/Discharge Planning: Goal: Ability to manage health-related needs will improve 02/11/2021 1010 by Wayne Sever, RN Outcome: Adequate for Discharge 02/11/2021 1010 by Wayne Sever, RN Outcome: Progressing   Problem: Clinical Measurements: Goal: Ability to maintain clinical measurements within normal limits will improve 02/11/2021 1010 by Wayne Sever, RN Outcome: Adequate for Discharge 02/11/2021 1010 by Wayne Sever, RN Outcome: Progressing Goal: Will remain free from infection Outcome: Adequate for Discharge Goal: Diagnostic test results will improve Outcome: Adequate for Discharge Goal: Respiratory complications will improve Outcome: Adequate for Discharge Goal: Cardiovascular complication will be avoided Outcome: Adequate for Discharge   Problem: Activity: Goal: Risk for activity intolerance will decrease Outcome: Adequate for Discharge   Problem: Nutrition: Goal: Adequate nutrition will be maintained Outcome: Adequate for Discharge   Problem: Coping: Goal: Level of anxiety will decrease Outcome: Adequate for Discharge   Problem: Elimination: Goal: Will not experience complications related to bowel motility Outcome: Adequate for Discharge Goal: Will not experience complications related to urinary retention Outcome: Adequate for Discharge   Problem: Pain Managment: Goal: General experience of comfort will improve Outcome: Adequate for Discharge   Problem: Safety: Goal: Ability to remain free from injury will improve Outcome: Adequate for Discharge   Problem: Skin Integrity: Goal: Risk for impaired  skin integrity will decrease Outcome: Adequate for Discharge   Problem: Acute Rehab PT Goals(only PT should resolve) Goal: Pt Will Go Supine/Side To Sit Outcome: Adequate for Discharge Goal: Patient Will Transfer Sit To/From Stand Outcome: Adequate for Discharge Goal: Pt Will Transfer Bed To Chair/Chair To Bed Outcome: Adequate for Discharge Goal: Pt Will Ambulate Outcome: Adequate for Discharge Goal: Pt Will Go Up/Down Stairs Outcome: Adequate for Discharge Goal: Pt/caregiver will Perform Home Exercise Program Outcome: Adequate for Discharge   Problem: Acute Rehab OT Goals (only OT should resolve) Goal: Pt. Will Perform Grooming Outcome: Adequate for Discharge Goal: Pt. Will Perform Upper Body Bathing Outcome: Adequate for Discharge Goal: Pt. Will Perform Lower Body Bathing Outcome: Adequate for Discharge Goal: Pt. Will Perform Upper Body Dressing Outcome: Adequate for Discharge Goal: Pt. Will Perform Lower Body Dressing Outcome: Adequate for Discharge Goal: Pt. Will Transfer To Toilet Outcome: Adequate for Discharge Goal: Pt. Will Perform Toileting-Clothing Manipulation Outcome: Adequate for Discharge Goal: OT Additional ADL Goal #1 Outcome: Adequate for Discharge

## 2021-02-11 NOTE — Discharge Instructions (Signed)
Delirium Tremens Delirium tremens, also called DTs, is the worst kind of alcohol withdrawal. It happens when you stop drinking or you drink less than usual. DTs is an emergency. You need to be treated in a hospital or a treatment center right away. What are the causes? DTs happens when you drink a lot and often and then stop drinking. It can also happen if you drink less than you usually drink. What increases the risk? The more a person drinks and the longer he or she drinks, the greater the risk of DTs. This condition is more likely to develop in those who have a history of drinking heavily and:  Have had alcohol withdrawal or DTs in the past.  Have had a seizure during past alcohol withdrawal.  Are elderly.  Are pregnant.  Use drugs.  Take medicines to help you relax (sedatives).  Have medical problems, including: ? Infections. ? Heart, lung, or liver disease. ? Seizures. ? Behavioral health conditions. What are the signs or symptoms? Early symptoms of alcohol withdrawal happen before symptoms of DTs. These symptoms may:  Start within 6 hours after the most recent drink.  Last for 5-7 days. Early symptoms may include:  Problems sleeping  Problems thinking clearly.  Feeling worried or nervous (anxious).  Feeling moodier and more upset than usual.  Fast breathing.  Headache.  Feeling sick to your stomach (nauseous) and vomiting.  Fever.  Sweating.  A heartbeat that feels faster than normal.  A heartbeat that feels like it skips a beat or flutters.  Shaking (tremor) of a body part. Symptoms of DTs may:  Start to develop 3 days after the most recent drink.  Last for up to 8 days. Symptoms include:  Changes in attention and awareness.  Changes in heart rate, temperature, blood pressure, and oxygen levels. Most often, the heart rate and body temperature will rise.  Feeling or seeing things that are not really there (hallucinations).  Extreme tiredness  and not being able to wake up.  Extreme confusion.  Seizures. How is this treated? Treatment for this condition is done in a hospital or treatment center. It may include:  Monitoring your temperature, heart rate, blood pressure, and oxygen.  Fluids and minerals given through an IV.  Medicines to help you relax.  Medicines that help you worry less.  Medicines to control your blood pressure.  Medicines to prevent or control seizures.  Vitamins.  Nutrition by mouth or through an IV.  Medicines that help you feel less sick to your stomach.  Talking to a counselor. Follow these instructions at home: Medicines  Take over-the-counter and prescription medicines only as told by your doctor.  If you are prescribed medicines to help you relax, take them only as told by your doctor. Eating and drinking  Do not drink alcohol.  Drink enough fluid to keep your pee (urine) pale yellow.   General instructions  Do not drive or use heavy machinery until your doctor approves.  If you drink a lot and often, do not try to stop drinking without help from your doctor.  Have someone stay with you in case you need help.  Think about joining an alcohol support group.  Keep all follow-up visits as told by your doctor. This is important. Contact a doctor if:  You have symptoms of alcohol withdrawal that get worse or do not go away.  You feel sad (depressed).  You drink alcohol even though you were trying not to.  You cannot eat   or drink without vomiting.  You are not able to stop drinking alcohol. Get help right away if:  You feel you may hurt yourself or others.  You have a seizure.  You have a fever.  You get very confused.  You feel or see things that are not there.  You are very sleepy and have trouble staying awake.  You vomit blood.  You shake or tremble very badly.  You sweat a lot.  You feel like your heart is beating too fast, skips a beat, or  flutters.  You have chest pain.  You have trouble breathing. If you ever feel like you may hurt yourself or others, or have thoughts about taking your own life, get help right away. You can go to your nearest emergency department or call:  Your local emergency services (911 in the [place]  A suicide crisis helpline, such as the National Suicide Prevention Lifeline at 50265141841-(906)777-2547. This is open 24 hours a day. These symptoms may be an emergency. Do not wait to see if the symptoms will go away. Get medical help right away. Call your local emergency services (911 in the [place] Do not drive yourself to the hospital. Summary  DTs is the worst kind of alcohol withdrawal. It happens when you stop drinking or you drink less.  DTs can be life-threatening.  You need to be treated in a hospital or a treatment center right away.  The more a person drinks and the longer he or she drinks, the greater the risk of DTs.  Contact a doctor if you drink alcohol even though you were trying not to. This information is not intended to replace advice given to you by your health care provider. Make sure you discuss any questions you have with your health care provider. Document Revised: 12/23/2018 Document Reviewed: 12/23/2018 Elsevier Patient Education  2021 Elsevier Inc.   Acute Kidney Injury, Adult  Acute kidney injury is a sudden worsening of kidney function. The kidneys are organs that have several jobs. They filter the blood to remove waste products and extra fluid. They also maintain a healthy balance of minerals and hormones in the body, which helps control blood pressure and keep bones strong. With this condition, your kidneys do not do their jobs as well as they should. This condition ranges from mild to severe. Over time, it may develop into long-lasting (chronic) kidney disease. Early detection and treatment may prevent acute kidney injury from developing into a chronic condition. What are the  causes? Common causes of this condition include:  A problem with blood flow to the kidneys. This may be caused by: ? Low blood pressure (hypotension) or shock. ? Blood loss. ? Heart and blood vessel (cardiovascular) disease. ? Severe burns. ? Liver disease.  Direct damage to the kidneys. This may be caused by: ? Certain medicines. ? A kidney infection. ? Poisoning. ? Being around or in contact with toxic substances. ? A surgical wound. ? A hard, direct hit to the kidney area.  A sudden blockage of urine flow. This may be caused by: ? Cancer. ? Kidney stones. ? An enlarged prostate in males. What increases the risk? You are more likely to develop this condition if you:  Are older than age 46.  Are male.  Are hospitalized, especially if you are in critical condition.  Have certain conditions, such as: ? Chronic kidney disease. ? Diabetes. ? Coronary artery disease and heart failure. ? Pulmonary disease. ? Chronic liver disease. What are  the signs or symptoms? Symptoms of this condition may not be obvious until the condition becomes severe. Symptoms of this condition can include:  Tiredness (lethargy) or difficulty staying awake.  Nausea or vomiting.  Swelling (edema) of the face, legs, ankles, or feet.  Problems with urination, such as: ? Pain in the abdomen, or pain along the side of your stomach (flank). ? Producing little or no urine. ? Passing urine with a weak flow.  Muscle twitches and cramps, especially in the legs.  Confusion or trouble concentrating.  Loss of appetite.  Fever. How is this diagnosed? Your health care provider can diagnose this condition based on your symptoms, medical history, and a physical exam.  You may also have other tests, such as:  Blood tests.  Urine tests.  Imaging tests.  A test in which a sample of tissue is removed from the kidneys to be examined under a microscope (kidney biopsy). How is this  treated? Treatment for this condition depends on the cause and how severe the condition is. In mild cases, treatment may not be needed. The kidneys may heal on their own. In more severe cases, treatment will involve:  Treating the cause of the kidney injury. This may involve changing any medicines you are taking or adjusting your dosage.  Fluids. You may need specialized IV fluids to balance your body's needs.  Having a catheter placed to drain urine and prevent blockages.  Preventing problems from occurring. This may mean avoiding certain medicines or procedures that can cause further injury to the kidneys. In some cases, treatment may also require:  A procedure to remove toxic wastes from the body (dialysis or continuous renal replacement therapy, CRRT).  Surgery. This may be done to repair a torn kidney or to remove the blockage from the urinary system. Follow these instructions at home: Medicines  Take over-the-counter and prescription medicines only as told by your health care provider.  Do not take any new medicines without your health care provider's approval. Many medicines can worsen your kidney damage.  Do not take any vitamin and mineral supplements without your health care provider's approval. Many nutritional supplements can worsen your kidney damage. Lifestyle  If your health care provider prescribed changes to your diet, follow them. You may need to decrease the amount of protein you eat.  Achieve and maintain a healthy weight. If you need help with this, ask your health care provider.  Start or continue an exercise plan. Try to exercise at least 30 minutes a day, 5 days a week.  Do not use any products that contain nicotine or tobacco, such as cigarettes, e-cigarettes, and chewing tobacco. If you need help quitting, ask your health care provider.   General instructions  Keep track of your blood pressure. Report changes in your blood pressure as told by your health  care provider.  Stay up to date with your vaccines. Ask your health care provider which vaccines you need.  Keep all follow-up visits as told by your health care provider. This is important.   Where to find more information  American Association of Kidney Patients: ResidentialShow.is  [employer]: www.kidney.org  [employer]: FightingMatch.com.ee  [facility]: ? www.lifeoptions.org ? www.kidneyschool.org Contact a health care provider if:  Your symptoms get worse.  You develop new symptoms. Get help right away if:  You develop symptoms of worsening kidney disease, which include: ? Headaches. ? Abnormally dark or light skin. ? Easy bruising. ? Frequent hiccups. ?  Chest pain. ? Shortness of breath. ? End of menstruation in women. ? Seizures. ? Confusion or altered mental status. ? Abdominal or back pain. ? Itchiness.  You have a fever.  Your body is producing less urine.  You have pain or bleeding when you urinate. Summary  Acute kidney injury is a sudden worsening of kidney function.  Acute kidney injury can be caused by problems with blood flow to the kidneys, direct damage to the kidneys, and sudden blockage of urine flow.  Symptoms of this condition may not be obvious until it becomes severe. Symptoms may include edema, lethargy, confusion, nausea or vomiting, and problems passing urine.  This condition can be diagnosed with blood tests, urine tests, and imaging tests. Sometimes a kidney biopsy is done to diagnose this condition.  Treatment for this condition often involves treating the underlying cause. It is treated with fluids, medicines, diet changes, dialysis, or surgery. This information is not intended to replace advice given to you by your health care provider. Make sure you discuss any questions you have with your health care provider. Document Revised: 10/14/2019 Document Reviewed: 10/14/2019 Elsevier Patient  Education  2021 Elsevier Inc.   Delirium Delirium is a state of mental confusion. It comes on quickly and causes significant changes in a person's thinking and behavior. People with delirium usually have trouble paying attention to what is going on or knowing where they are. They may become very withdrawn or very emotional and unable to sit still. They may even see or feel things that are not there (hallucinations). Delirium is a sign of a serious underlying medical condition. What are the causes? Delirium occurs when something suddenly affects the signals that the brain sends out. Brain signals can be affected by anything that puts severe stress on the body and brain and causes brain chemicals to be out of balance. The most common causes of delirium include:  Infections. These may be bacterial, viral, fungal, or protozoal.  Medicines. These include many over-the-counter and prescription medicines.  Recreational drugs.  Substance withdrawal. This occurs with sudden discontinuation of alcohol, certain medicines, or recreational drugs.  Surgery and anesthesia.  Sudden vascular events, such as stroke and brain hemorrhage.  Other brain disorders, such as migraines, tumors, seizures, and physical head trauma.  Metabolic disorders, such as kidney or liver failure.  Low blood oxygen (anoxia). This may occur with lung disease, cardiac arrest, or carbon monoxide poisoning.  Hormone imbalances (endocrinopathies), such as an overactive thyroid (hyperthyroidism) or underactive thyroid (hypothyroidism).  Vitamin deficiencies. What increases the risk? The following factors may make someone more likely to develop this condition:  Being a child.  Being an older person.  Living alone.  Having vision loss or hearing loss.  Having an existing brain disease, such as dementia.  Having long-lasting (chronic) medical conditions, such as heart disease.  Being hospitalized for long periods of  time. What are the signs or symptoms? Delirium starts with a sudden change in a person's thinking or behavior. Symptoms include:  Not being able to stay awake (drowsiness) or pay attention.  Being confused about places, time, and people.  Forgetfulness.  Having extreme energy levels. These may be low or high.  Changes in sleep patterns.  Extreme mood swings, such as sudden anger or anxiety.  Focusing on things or ideas that are not important.  Rambling and senseless talking.  Difficulty speaking, understanding speech, or both.  Hallucinations.  Tremor or unsteady gait. Symptoms come and go throughout the day  and are often worse at the end of the day. How is this diagnosed? People with delirium may not realize that they have the condition. Often, a family member or health care provider is the first person to notice the changes. This condition may be diagnosed based on a physical exam, health history, and tests.  The health care provider will obtain a detailed history. This may include questions about: ? Current symptoms. ? Medical conditions that you have. ? Medicines. ? Drug use.  The health care provider will perform a mental status test by: ? Asking questions to check for confusion. ? Watching for abnormal behavior.  The health care provider may also order lab tests or additional studies to determine the cause of the delirium. How is this treated? Treatment of delirium depends on the cause and severity. Delirium usually goes away within days or weeks of treating the underlying cause. In the meantime, do not leave the person alone because he or she may accidentally cause self-harm. This condition may be treated with supportive care, such as:  Increased light during the day and decreased light at night.  Low noise level.  Uninterrupted sleep.  A regular daily schedule.  Clocks and calendars to help with orientation.  Familiar objects, including the person's  pictures and clothing.  Frequent visits from familiar family and friends.  A healthy diet.  Gentle exercise. In more severe cases of delirium, medicine may be prescribed to help the person keep calm and think more clearly.   Follow these instructions at home:  Continue supportive care as told by a health care provider.  Take over-the-counter and prescription medicines only as told by your health care provider.  Ask a health care provider before using herbs or supplements.  Do not use alcohol or illegal drugs.  Keep all follow-up visits. This is important.   Contact a health care provider if:  Symptoms do not get better or they become worse.  New symptoms of delirium develop.  Caring for the person at home does not seem safe.  Eating, drinking, or communicating stops.  There are side effects of medicines, such as changes in sleep patterns, dizziness, weight gain, restlessness, movement changes, or tremors. Get help right away if:  The person has thoughts of harming self or harming others.  There are serious side effects of medicine, such as: ? Swelling of the face, lips, tongue, or throat. ? Fever, confusion, muscle spasms, or seizures. If you ever feel like a loved one may hurt himself or herself or others, or shares thoughts about taking his or her own life, get help right away. You can go to your nearest emergency department or:  Call your local emergency services (911 in the [place]  Call a suicide crisis helpline, such as the National Suicide Prevention Lifeline at (951) 299-3129. This is open 24 hours a day in the [place]  Text the Crisis Text Line at 916 846 1469 (in the [place] Summary  Delirium is a state of mental confusion. It comes on quickly and causes significant changes in a person's thinking and behavior.  Delirium is a sign of a serious underlying medical condition.  Certain medical conditions or a long hospital stay may increase the risk of developing  delirium.  Treatment of delirium involves treating the underlying cause and providing supportive treatments, such as a calm and familiar environment. This information is not intended to replace advice given to you by your health care provider. Make sure you discuss any questions you have with  your health care provider. Document Revised: 03/12/2020 Document Reviewed: 03/12/2020 Elsevier Patient Education  2021 [employer].

## 2021-02-11 NOTE — Discharge Summary (Addendum)
Physician Discharge Summary  JDEN WANT ZOX:[id number] DOB: 1976/12/09 DOA: 02/04/2021  PCP: Gaspar Garbe, MD  Admit date: 02/04/2021 Discharge date: 02/11/2021  Admitted From: home Disposition:  CIR  Recommendations for Outpatient Follow-up:  1. Follow up with PCP in 1-2 weeks 2. Please obtain CMP, CK for 3 more days at [facility]  Home Health:NO  Equipment/Devices: NONE  Discharge Condition: Stable Code Status:   Code Status: Full Code Diet recommendation:  Diet Order            Diet - low sodium heart healthy           Diet Heart Room service appropriate? Yes with Assist; Fluid consistency: Thin  Diet effective now                  Brief/Interim Summary: 45 year old male with morbid obesity/OSA on CPAP with marginal compliance, tobacco and alcohol abuse, hypertension, chronic back pain, history of sigmoid diverticulitis with perforation in 2012 to the ED on 2/18 after being unresponsive and difficult to arouse in the early morning at home was diaphoretic with foaming of the mouth but no witnessed seizure activity.Apparently he has not been using CPAP.  Was brought by EMS, had a pulse with agonal respiratory pattern and hypoxia status post Narcan with some improvement in mental status with agitation then received Ativan on arrival to the ED and 6 L nasal cannula oxygen, was hypotensive with lactic acidosis 8.2, renal failure, metabolic and respiratory acidosis, internal positivity for drug screen was negative, etoh 84 lab with rhabdomyolysis leukocytosis transaminitis renal failure hyperkalemia acidosis. CT abdomen with mild bilateral posterior basilar subsegmental atelectasis versus infiltrate but no hydronephrosis, normal liver and pancreas with sigmoid diverticulosis with some focal wall thickening in the proximal sigmoid and minimal surrounding inflammatory change, negative CT head/CT C-spine. Empirically placed on vancomycin and cefepime for possible sepsis, treated  with  aggressive IV fluid hydration.Patient was admitted to critical care-treated for acute metabolic encephalopathy/hypoxic respiratory failure/rhabdomyolysis with severe AKI hyperkalemia metabolic acidosis.  Seen by nephrology. Patient has been also complaining of left lower leg weakness/foot drop and edema pain diminished sensation-seen by Dr. Jerrel Ivory orthopedics 2/20 no evidence of compartment syndrome blood flow likely from peroneal nerve compression advised nerve conduction study. 2/21-He remembers hurting on the back ( can't' remember where else" remembers waking up in the hospital and since then c/o back pain and can't feel the left leg and cannot move it and it is hurting too.reports he has chronic back pain he sees which is unchanged. He is able to flex his left knee.  MRI lumbar spine was done limited with motion artifact but shows "Extensive edema is present in the right paraspinous muscles without fluid collection" His renal function has stabilized ,is putting out urine.He has had left foot drop and seems to be improving. At this time antitoxinogen stabilized.  CK significantly improved at 13 K.  He is afebrile leukocytosis downtrending. He will need continued inpatient rehabilitation with CIR and os stable  Discharge Diagnoses:  Acute kidney injury from combination of volume contraction/pigment nephropathy from rhabdomyolysis Hyperkalemia due to AKI/Anion gap with a severe acidosis due to AKI Renal function stabilized, OFF Foley catheter.  Encourage oral intake.Avoid entersto/ACEI-ARBs at least for 4 weeks. Recent Labs  Lab 02/07/21 0126 02/08/21 0104 02/09/21 0112 02/10/21 0206 02/11/21 0057  BUN CREATININE 1.30* 1.18 1.11 1.20 1.19   Rhabdomyolysis-likely from prolonged immobilization, MRI back limited due to motion artifact but shows  right paraspinous myositis-no fluid collection. Reportedly his one leg was hanging from bed at the edematous left thigh. CK > 50K for few  days>32k>20k-> 13k.  Advise IV fluids for next 24 hours at CIR. I discussed this with nephrologist Dr. Arlean Hopping.check CK for next few more days  Hypokalemia-resolved.    Left foot drop left peroneal nerve compression,no evidence of compartment syndrome seen by Dr. Charlann Boxer from orthopedic.  MRI lumbar spine limited but shows right-sided paraspinous myositis.Advised AFO to support left foot until recovery and needs NCS and f/u w/ Dr.  Ethelene Hal from orthopedic.  He is having more  mobility in his left ankle now  Sepsis POA/Aspiration pneumonitis- refer to  ICU note/ICU course.Resolved . Leukocytosis: Patient has been on Augmentin for aspiration pneumonitis. WBC now downtrending.  Procalcitonin 0.3.  Monitor for fever/any signs of infection. montior thigh area and consider imaging if has fever persistent leukocytosis or any concern for infection. Recent Labs  Lab 02/04/21 1150 02/04/21 1437 02/04/21 2139 02/05/21 1453 02/06/21 0219 02/07/21 0126 02/08/21 0104 02/09/21 0112 02/10/21 0206 02/11/21 0057  WBC  --   --   --  13.1*   < > 13.7* 12.4* 14.6* 16.6* 13.7*  LATICACIDVEN  --  2.7* 2.5* 1.5  --   --   --   --   --   --   PROCALCITON 8.88  --   --   --   --   --   --   --  0.45 0.37   < > = values in this interval not displayed.   Acute hypoxic and hypercapnic respiratory failure/OSA with marginal compliance w/ CPAP. with possible aspiration pneumonia/pneumonitis on admission: On admission needing nonrebreather did not require intubation.  He is off oxygen.  Continue bedtime CPAP.Antibiotic has been changed to Augmentin-can stop in next 2 days. Blood culture urine culture no growth so far.  WBC still up we will monitor.  Heavy alcohol abuse- he reports he drank 1 pint of liquor before going to bed on the night before admission.  He reports "I need to quit: Patient has been counseled to quit.  Acute metabolic encephalopathy, suspect etoh induced, was found unresponsive at home.CT head  unremarkable.  Mental status normal at baseline  Abnormal LFTs/transaminitis due to rhabdomyolysis.  LFTs downtrending nicely.AST/ALT peaked 1500/50. Acute viral hepatitis panel was unremarkable.  Monitor intermittently Recent Labs  Lab 02/06/21 0219 02/07/21 0126 02/08/21 0104 02/09/21 0112 02/10/21 0206  AST 1,243* 938* 783* 659* 451*  ALT 428* 335* 292* 255* 215*  ALKPHOS 50 50 54 47 46  BILITOT 1.1 1.0 0.9 1.1 1.4*  PROT 6.0* 6.0* 5.7* 5.4* 5.3*  ALBUMIN 2.6* 2.5* 2.4* 2.4* 2.3*   Hypertension: Continue metoprolol dose increased to 25 mg bid, and adjust the dose. holding ARB/diuretics  Morbid Obesity w BMI > 39: OP follow-up and weight loss  Focal wall thickening incidentally noted on the CT abdomen on proximal sigmoid colon-uncertain if acute diverticulitis or sequelae of previous episode no abdominal pain.  Insomnia cont prn trazodone  Consults:  Orthopedic, nephrology  Subjective: AAOX3, ABLE TO MOVE HIS LEFT ANKLE AND LEFT LEG NOW. Feeling motivated, wants to go to CIR today  Discharge Exam: Vitals:   02/11/21 0328 02/11/21 0710  BP: (!) 155/95 (!) 157/90  Pulse: 84 87  Resp: 19 18  Temp: 98.2 F (36.8 C) 98.8 F (37.1 C)  SpO2: 98% 97%   General: Pt is alert, awake, not in acute distress Cardiovascular: RRR, S1/S2 +, no  rubs, no gallops Respiratory: CTA bilaterally, no wheezing, no rhonchi Abdominal: Soft, NT, ND, bowel sounds + Extremities: no edema, no cyanosis. Left thigh slightly more swollen, no ntender  Discharge Instructions  Discharge Instructions    Diet - low sodium heart healthy   Complete by: As directed    Discharge instructions   Complete by: As directed    Monitor LFTs and CK at cir  Please call call MD or return to ER for similar or worsening recurring problem that brought you to hospital or if any fever,nausea/vomiting,abdominal pain, uncontrolled pain, chest pain,  shortness of breath or any other alarming symptoms.  Please  follow-up your doctor as instructed in a week time and call the office for appointment.  Please avoid alcohol, smoking, or any other illicit substance and maintain healthy habits including taking your regular medications as prescribed.  You were cared for by a hospitalist during your hospital stay. If you have any questions about your discharge medications or the care you received while you were in the hospital after you are discharged, you can call the unit and ask to speak with the hospitalist on call if the hospitalist that took care of you is not available.  Once you are discharged, your primary care physician will handle any further medical issues. Please note that NO REFILLS for any discharge medications will be authorized once you are discharged, as it is imperative that you return to your primary care physician (or establish a relationship with a primary care physician if you do not have one) for your aftercare needs so that they can reassess your need for medications and monitor your lab values   Increase activity slowly   Complete by: As directed      Allergies as of 02/11/2021      Reactions   Lisinopril Swelling      Medication List    STOP taking these medications   sildenafil 20 MG tablet Commonly known as: REVATIO   valsartan-hydrochlorothiazide 160-12.5 MG tablet Commonly known as: DIOVAN-HCT     TAKE these medications   amoxicillin-clavulanate 875-125 MG tablet Commonly known as: AUGMENTIN Take 1 tablet by mouth every 12 (twelve) hours for 2 days.   methocarbamol 750 MG tablet Commonly known as: ROBAXIN Take 1 tablet (750 mg total) by mouth every 6 (six) hours as needed for muscle spasms.   metoprolol tartrate 25 MG tablet Commonly known as: LOPRESSOR Take 1 tablet (25 mg total) by mouth 2 (two) times daily.   oxyCODONE 5 MG immediate release tablet Commonly known as: Oxy IR/ROXICODONE Take 1 tablet (5 mg total) by mouth every 4 (four) hours as needed for  severe pain.   polyethylene glycol 17 g packet Commonly known as: MIRALAX / GLYCOLAX Take 17 g by mouth daily. Start taking on: February 12, 2021   senna-docusate 8.6-50 MG tablet Commonly known as: Senokot-S Take 1 tablet by mouth 2 (two) times daily.   simethicone 80 MG chewable tablet Commonly known as: MYLICON Chew 1 tablet (80 mg total) by mouth 4 (four) times daily as needed for flatulence.   terbinafine 250 MG tablet Commonly known as: LamISIL Take 1 tablet (250 mg total) by mouth daily.   traZODone 50 MG tablet Commonly known as: DESYREL Take 1 tablet (50 mg total) by mouth at bedtime as needed for sleep.       Follow-up Information    Tisovec, Adelfa Koh, MD Follow up in 1 week(s).   Specialty: Internal Medicine Contact information: [address] [place] [address] [phone]              Allergies  Allergen Reactions  . Lisinopril Swelling    The results of significant diagnostics from this hospitalization (including imaging, microbiology, ancillary and laboratory) are listed below for reference.    Microbiology: Recent Results (from the past 240 hour(s))  Culture, blood (Routine X 2) w Reflex to ID Panel     Status: None   Collection Time: 02/04/21  6:50 AM   Specimen: BLOOD  Result Value Ref Range Status   Specimen Description BLOOD RIGHT ANTECUBITAL  Final   Special Requests   Final    BOTTLES DRAWN AEROBIC ONLY Blood Culture results may not be optimal due to an inadequate volume of blood received in culture bottles   Culture   Final    NO GROWTH 5 DAYS Performed at [facility], [address] [place], [place] [address]    Report Status 02/09/2021 FINAL  Final  Culture, blood (Routine X 2) w Reflex to ID Panel     Status: None   Collection Time: 02/04/21  7:55 AM   Specimen: BLOOD  Result Value Ref Range Status   Specimen Description BLOOD LEFT ANTECUBITAL  Final   Special Requests   Final    BOTTLES DRAWN AEROBIC AND  ANAEROBIC Blood Culture results may not be optimal due to an inadequate volume of blood received in culture bottles   Culture   Final    NO GROWTH 5 DAYS Performed at [facility], [address], [place], [place] [address]    Report Status 02/09/2021 FINAL  Final  Urine Culture     Status: None   Collection Time: 02/04/21  8:25 AM   Specimen: Urine, Random  Result Value Ref Range Status   Specimen Description URINE, RANDOM  Final   Special Requests NONE  Final   Culture   Final    NO GROWTH Performed at [facility], [address], [place], [place] [address]    Report Status 02/05/2021 FINAL  Final  Resp Panel by RT-PCR (Flu A&B, Covid) Nasopharyngeal Swab     Status: None   Collection Time: 02/04/21  9:25 AM   Specimen: Nasopharyngeal Swab; Nasopharyngeal(NP) swabs in vial transport medium  Result Value Ref Range Status   SARS Coronavirus 2 by RT PCR NEGATIVE NEGATIVE Final    Comment: (NOTE) SARS-CoV-2 target nucleic acids are NOT DETECTED.  The SARS-CoV-2 RNA is generally detectable in upper respiratory specimens during the acute phase of infection. The lowest concentration of SARS-CoV-2 viral copies this assay can detect is 138 copies/mL. A negative result does not preclude SARS-Cov-2 infection and should not be used as the sole basis for treatment or other patient management decisions. A negative result may occur with  improper specimen collection/handling, submission of specimen other than nasopharyngeal swab, presence of viral mutation(s) within the areas targeted by this assay, and inadequate number of viral copies(<138 copies/mL). A negative result must be combined with clinical observations, patient history, and epidemiological information. The expected result is Negative.  Fact Sheet for Patients:  BloggerCourse.com  Fact Sheet for Healthcare Providers:  SeriousBroker.it  This test is no t yet  approved or cleared by the [place] FDA and  has been authorized for detection and/or diagnosis of SARS-CoV-2 by FDA under an Emergency Use Authorization (EUA). This EUA will remain  in effect (meaning this test can be used) for the duration of  the COVID-19 declaration under Section 564(b)(1) of the Act, 21 U.S.C.section 360bbb-3(b)(1), unless the authorization is terminated  or revoked sooner.       Influenza A by PCR NEGATIVE NEGATIVE Final   Influenza B by PCR NEGATIVE NEGATIVE Final    Comment: (NOTE) The Xpert Xpress SARS-CoV-2/FLU/RSV plus assay is intended as an aid in the diagnosis of influenza from Nasopharyngeal swab specimens and should not be used as a sole basis for treatment. Nasal washings and aspirates are unacceptable for Xpert Xpress SARS-CoV-2/FLU/RSV testing.  Fact Sheet for Patients: BloggerCourse.com  Fact Sheet for Healthcare Providers: SeriousBroker.it  This test is not yet approved or cleared by the [place] FDA and has been authorized for detection and/or diagnosis of SARS-CoV-2 by FDA under an Emergency Use Authorization (EUA). This EUA will remain in effect (meaning this test can be used) for the duration of the COVID-19 declaration under Section 564(b)(1) of the Act, 21 U.S.C. section 360bbb-3(b)(1), unless the authorization is terminated or revoked.  Performed at [facility], [address] [place], [place] [address]   MRSA PCR Screening     Status: None   Collection Time: 02/04/21  1:37 PM   Specimen: Nasal Mucosa; Nasopharyngeal  Result Value Ref Range Status   MRSA by PCR NEGATIVE NEGATIVE Final    Comment:        The GeneXpert MRSA Assay (FDA approved for NASAL specimens only), is one component of a comprehensive MRSA colonization surveillance program. It is not intended to diagnose MRSA infection nor to guide or monitor treatment for MRSA infections. Performed at  [facility], [address], [place], [place] [address]   Blood Culture (routine x 2)     Status: None   Collection Time: 02/04/21  2:22 PM   Specimen: BLOOD RIGHT HAND  Result Value Ref Range Status   Specimen Description BLOOD RIGHT HAND  Final   Special Requests   Final    BOTTLES DRAWN AEROBIC ONLY Blood Culture results may not be optimal due to an inadequate volume of blood received in culture bottles   Culture   Final    NO GROWTH 5 DAYS Performed at [facility], [address], [place], [place] [address]    Report Status 02/09/2021 FINAL  Final  Blood Culture (routine x 2)     Status: None   Collection Time: 02/04/21  2:37 PM   Specimen: BLOOD LEFT HAND  Result Value Ref Range Status   Specimen Description BLOOD LEFT HAND  Final   Special Requests   Final    BOTTLES DRAWN AEROBIC ONLY Blood Culture results may not be optimal due to an inadequate volume of blood received in culture bottles   Culture   Final    NO GROWTH 5 DAYS Performed at [facility], [address], [place], [place] [address]    Report Status 02/09/2021 FINAL  Final    Procedures/Studies: CT ABDOMEN PELVIS WO CONTRAST  Result Date: 02/04/2021 CLINICAL DATA:  Acute generalized abdominal pain. EXAM: CT ABDOMEN AND PELVIS WITHOUT CONTRAST TECHNIQUE: Multidetector CT imaging of the abdomen and pelvis was performed following the standard protocol without IV contrast. COMPARISON:  June 12, 2015. FINDINGS: Lower chest: Mild bilateral posterior basilar subsegmental atelectasis or infiltrates are noted. Hepatobiliary: No focal liver abnormality is seen. No gallstones, gallbladder wall thickening, or biliary dilatation. Pancreas: Unremarkable. No pancreatic ductal dilatation or surrounding inflammatory changes. Spleen: Normal in size without focal abnormality. Adrenals/Urinary Tract:  Adrenal glands and kidneys appear normal. No hydronephrosis or renal obstruction is noted. No renal or ureteral calculi  are noted. Urinary bladder is decompressed secondary to Foley catheter. Stomach/Bowel: Stomach is within normal limits. Appendix appears normal. No evidence of bowel obstruction. Sigmoid diverticulosis is noted. There is focal wall thickening involving the proximal sigmoid colon with minimal surrounding inflammatory changes; is uncertain if this represents acute diverticulitis or sequela of previous such inflammation. Vascular/Lymphatic: No significant vascular findings are present. No enlarged abdominal or pelvic lymph nodes. Reproductive: Prostate is unremarkable. Other: No abdominal wall hernia or abnormality. No abdominopelvic ascites. Musculoskeletal: No acute or significant osseous findings. IMPRESSION: 1. Mild bilateral posterior basilar subsegmental atelectasis or infiltrates are noted. 2. Focal wall thickening is seen involving the proximal sigmoid colon with minimal surrounding inflammatory changes; it is uncertain if this represents acute diverticulitis or sequela of previous such inflammation. Electronically Signed   By: Lupita Raider M.D.   On: 02/04/2021 09:28   DG Abd 1 View  Result Date: 02/06/2021 CLINICAL DATA:  Abdominal pain and distention. EXAM: ABDOMEN - 1 VIEW COMPARISON:  02/04/2021 FINDINGS: The previously demonstrated enteric tube appears to have been removed. There is nonspecific gaseous distension the:. There is no significant evidence for a small bowel obstruction. There are retrocardiac airspace opacities suggestive of atelectasis. There is no pneumatosis or free air. There is a relative paucity of bowel gas at the level of the rectum. IMPRESSION: Nonspecific gaseous distension of the colon. Electronically Signed   By: Katherine Mantle M.D.   On: 02/06/2021 15:28   CT HEAD WO CONTRAST  Result Date: 02/04/2021 CLINICAL DATA:  Neck trauma, intoxicated or obtained EXAM: CT HEAD WITHOUT CONTRAST CT CERVICAL SPINE WITHOUT CONTRAST TECHNIQUE: Multidetector CT imaging of the head  and cervical spine was performed following the standard protocol without intravenous contrast. Multiplanar CT image reconstructions of the cervical spine were also generated. COMPARISON:  None. FINDINGS: CT HEAD FINDINGS Brain: No evidence of acute infarction, hemorrhage, hydrocephalus, extra-axial collection or mass lesion/mass effect. Vascular: No focal hyperdense vessel or unexpected calcification. Skull: Normal. Negative for fracture or focal lesion. Sinuses/Orbits: No acute finding. CT CERVICAL SPINE FINDINGS Alignment: Normal. Skull base and vertebrae: No acute fracture. No primary bone lesion or focal pathologic process. Soft tissues and spinal canal: No prevertebral fluid or swelling. No visible canal hematoma. Disc levels:  Mid cervical degenerative disc narrowing and ridging. Upper chest: Atelectasis seen in the right upper lobe. IMPRESSION: No acute finding in the head or cervical spine. Electronically Signed   By: Marnee Spring M.D.   On: 02/04/2021 07:13   CT CERVICAL SPINE WO CONTRAST  Result Date: 02/04/2021 CLINICAL DATA:  Neck trauma, intoxicated or obtained EXAM: CT HEAD WITHOUT CONTRAST CT CERVICAL SPINE WITHOUT CONTRAST TECHNIQUE: Multidetector CT imaging of the head and cervical spine was performed following the standard protocol without intravenous contrast. Multiplanar CT image reconstructions of the cervical spine were also generated. COMPARISON:  None. FINDINGS: CT HEAD FINDINGS Brain: No evidence of acute infarction, hemorrhage, hydrocephalus, extra-axial collection or mass lesion/mass effect. Vascular: No focal hyperdense vessel or unexpected calcification. Skull: Normal. Negative for fracture or focal lesion. Sinuses/Orbits: No acute finding. CT CERVICAL SPINE FINDINGS Alignment: Normal. Skull base and vertebrae: No acute fracture. No primary bone lesion or focal pathologic process. Soft tissues and spinal canal: No prevertebral fluid or swelling. No visible canal hematoma. Disc  levels:  Mid cervical degenerative disc narrowing and ridging. Upper chest: Atelectasis seen in the right  upper lobe. IMPRESSION: No acute finding in the head or cervical spine. Electronically Signed   By: Marnee Spring M.D.   On: 02/04/2021 07:13   MR LUMBAR SPINE WO CONTRAST  Result Date: 02/07/2021 CLINICAL DATA:  Low back pain with left foot drop. EXAM: MRI LUMBAR SPINE WITHOUT CONTRAST TECHNIQUE: Multiplanar, multisequence MR imaging of the lumbar spine was performed. No intravenous contrast was administered. COMPARISON:  Lumbar radiographs 09/29/2007 FINDINGS: Segmentation: Motion degraded study. The patient was not able to complete the study and axial images were not obtained. Normal segmentation. Alignment:  Normal Vertebrae:  Negative for fracture or mass. Conus medullaris and cauda equina: Conus not well visualized. Paraspinal and other soft tissues: Extensive edema is present in the right paraspinous muscles without fluid collection. Left muscles appear normal. Disc levels: L1-2: Negative L2-3: Negative L3-4: Disc degeneration with disc bulging and associated spurring. No significant stenosis L4-5: Disc degeneration with disc bulging and associated endplate spurring. Mild to moderate subarticular stenosis bilaterally L5-S1: Mild disc bulging.  Mild subarticular stenosis bilaterally. IMPRESSION: Incomplete and motion degraded study. The patient was not able to tolerate axial images There is extensive edema in the right paraspinous muscle suggesting myositis. This could be infectious or posttraumatic. Mild lumbar degenerative disc disease at L3-4, L4-5, L5-S1. Electronically Signed   By: Marlan Palau M.D.   On: 02/07/2021 15:13   US RENAL  Result Date: 02/04/2021 CLINICAL DATA:  Acute renal failure. EXAM: RENAL / URINARY TRACT ULTRASOUND COMPLETE COMPARISON:  CT abdomen and pelvis today. FINDINGS: Right Kidney: Renal measurements: 11.6 x 5.7 x 5.3 cm = volume: 183.5 mL. Echogenicity within  normal limits. No mass or hydronephrosis visualized. Left Kidney: Renal measurements: 12.1 x 6.5 x 6.0 cm = volume: 247 mL. Echogenicity within normal limits. No mass or hydronephrosis visualized. Bladder: Not visualized. Note is made that the urinary bladder is completely decompressed with a Foley catheter in place on the comparison CT. Other: None. IMPRESSION: Negative for hydronephrosis.  Normal appearing kidneys. Electronically Signed   By: Drusilla Kanner M.D.   On: 02/04/2021 13:11   DG Pelvis Portable  Result Date: 02/04/2021 CLINICAL DATA:  Altered mental status.  Left hip pain. EXAM: PORTABLE PELVIS 1-2 VIEWS COMPARISON:  None. FINDINGS: There is no evidence of pelvic fracture or diastasis. No pelvic bone lesions are seen. IMPRESSION: Negative. Electronically Signed   By: Marnee Spring M.D.   On: 02/04/2021 07:33   DG Chest Port 1 View  Result Date: 02/05/2021 CLINICAL DATA:  Concern for aspiration pneumonia EXAM: PORTABLE CHEST 1 VIEW COMPARISON:  Yesterday FINDINGS: Cardiomegaly. Enteric tube with tip at the stomach. Negative aortic and hilar contours for technique. Low volume chest with interstitial crowding and streaky density at the bases where there is opacity and volume loss on CT of the abdomen from yesterday. No edema . No effusion or pneumothorax. IMPRESSION: Stable lower lobe infiltrates as seen by chest CT yesterday. Electronically Signed   By: Marnee Spring M.D.   On: 02/05/2021 07:25   DG Chest Port 1 View  Result Date: 02/04/2021 CLINICAL DATA:  Unresponsive EXAM: PORTABLE CHEST 1 VIEW COMPARISON:  08/06/2011 FINDINGS: Borderline heart size accentuated by technique. Indistinct perihilar density. No visible effusion or pneumothorax. IMPRESSION: Perihilar opacity with history favoring atelectasis or aspiration. Electronically Signed   By: Marnee Spring M.D.   On: 02/04/2021 06:44   DG Abd Portable 1 View  Result Date: 02/04/2021 CLINICAL DATA:  NG tube placement. EXAM:  PORTABLE ABDOMEN - 1  VIEW COMPARISON:  11/07/2007. FINDINGS: 1018 hours. NG tube tip is in the mid stomach. Proximal side port of the NG tube is just above the GE junction. Visualized upper abdomen demonstrates nonspecific gas pattern. IMPRESSION: NG tube tip is in the mid stomach although proximal side port is above the GE junction. Tube could be advanced approximately 5-6 cm for placement of the side port below the GE junction. Electronically Signed   By: Kennith Center M.D.   On: 02/04/2021 10:30   VAS [place] ABI WITH/WO TBI  Result Date: 02/05/2021 LOWER EXTREMITY DOPPLER STUDY Indications: Rest pain.  Limitations: Today's exam was limited due to patient positioning, involuntary              patient movement, bandages , patient unable to lie flat and IV,              ambient room "noise". Comparison Study: No prior studies. Performing Technologist: Olen Cordial RVT  Examination Guidelines: A complete evaluation includes at minimum, Doppler waveform signals and systolic blood pressure reading at the level of bilateral brachial, anterior tibial, and posterior tibial arteries, when vessel segments are accessible. Bilateral testing is considered an integral part of a complete examination. Photoelectric Plethysmograph (PPG) waveforms and toe systolic pressure readings are included as required and additional duplex testing as needed. Limited examinations for reoccurring indications may be performed as noted.  ABI Findings: +--------+------------------+-----+---------+----------------------------------+ Right   Rt Pressure (mmHg)IndexWaveform Comment                            +--------+------------------+-----+---------+----------------------------------+ Brachial                                Unable to obtain waveform and                                              pressure due to IV and bandages    +--------+------------------+-----+---------+----------------------------------+ PTA     138                1.55 triphasic                                   +--------+------------------+-----+---------+----------------------------------+ DP      160               1.80 triphasic                                   +--------+------------------+-----+---------+----------------------------------+ +--------+------------------+-----+---------+-------+ Left    Lt Pressure (mmHg)IndexWaveform Comment +--------+------------------+-----+---------+-------+ Brachial89                     triphasic        +--------+------------------+-----+---------+-------+ PTA     167               1.88 triphasic        +--------+------------------+-----+---------+-------+ DP      152               1.71 triphasic        +--------+------------------+-----+---------+-------+ +-------+-----------+-----------+------------+------------+ ABI/TBIToday's ABIToday's TBIPrevious ABIPrevious TBI +-------+-----------+-----------+------------+------------+ Right  1.8                                            +-------+-----------+-----------+------------+------------+  Left   1.88                                           +-------+-----------+-----------+------------+------------+  Summary: Right: Resting right ankle-brachial index indicates noncompressible right lower extremity arteries. Values are likely falsely elevated. Captured waveforms do not represent what was heard during the test. Triphasic waveforms were noted in the posterior tibial and dorsalis pedis arteries. Unable to obtain TBI due to patient constant movement. Left: Resting left ankle-brachial index indicates noncompressible left lower extremity arteries. Values are likely falsely elevated. Captured waveforms do not represent what was heard during the test. Triphasic waveforms were noted in the posterior tibial and dorsalis pedis arteries. Unable to obtain TBI due to patient constant movement.  *See table(s) above for measurements and  observations.  Electronically signed by Heath Larkhomas Hawken on 02/05/2021 at 11:05:12 AM.    Final    ECHOCARDIOGRAM COMPLETE  Result Date: 02/04/2021    ECHOCARDIOGRAM REPORT   Patient Name:   Shayne AlkenQUENTIN B Cooperwood Date of Exam: 02/04/2021 Medical Rec #:  [id number]       Height:       65.0 in Accession #:    [id number]      Weight:       225.0 lb Date of Birth:  [birth year]       BSA:          2.080 m Patient Age:    44 years        BP:           139/121 mmHg Patient Gender: M               HR:           101 bpm. Exam Location:  Inpatient Procedure: 2D Echo, Cardiac Doppler and Color Doppler Indications:    Murmur  History:        Patient has no prior history of Echocardiogram examinations.                 Risk Factors:Sleep Apnea and Hypertension.  Sonographer:    Ross LudwigArthur Guy RDCS (AE) Referring Phys: [id number] [facility] CenterTEPHANIE M REESE  Sonographer Comments: Suboptimal subcostal window. IMPRESSIONS  1. Left ventricular ejection fraction, by estimation, is 60 to 65%. The left ventricle has normal function. The left ventricle has no regional wall motion abnormalities. There is moderate left ventricular hypertrophy. Left ventricular diastolic parameters are consistent with Grade I diastolic dysfunction (impaired relaxation).  2. Right ventricular systolic function is normal. The right ventricular size is normal.  3. The mitral valve is normal in structure. No evidence of mitral valve regurgitation. No evidence of mitral stenosis.  4. The aortic valve is tricuspid. There is moderate calcification of the aortic valve. There is moderate thickening of the aortic valve. Aortic valve regurgitation is not visualized. Mild to moderate aortic valve sclerosis/calcification is present, without any evidence of aortic stenosis.  5. The inferior vena cava is normal in size with greater than 50% respiratory variability, suggesting right atrial pressure of 3 mmHg. FINDINGS  Left Ventricle: Left ventricular ejection fraction, by estimation, is 60 to 65%.  The left ventricle has normal function. The left ventricle has no regional wall motion abnormalities. The left ventricular internal cavity size was normal in size. There is  moderate left ventricular hypertrophy. Left ventricular diastolic parameters are consistent with Grade I diastolic dysfunction (impaired relaxation). Right  Ventricle: The right ventricular size is normal. No increase in right ventricular wall thickness. Right ventricular systolic function is normal. Left Atrium: Left atrial size was normal in size. Right Atrium: Right atrial size was normal in size. Pericardium: There is no evidence of pericardial effusion. Mitral Valve: The mitral valve is normal in structure. No evidence of mitral valve regurgitation. No evidence of mitral valve stenosis. Tricuspid Valve: The tricuspid valve is normal in structure. Tricuspid valve regurgitation is not demonstrated. No evidence of tricuspid stenosis. Aortic Valve: The aortic valve is tricuspid. There is moderate calcification of the aortic valve. There is moderate thickening of the aortic valve. Aortic valve regurgitation is not visualized. Mild to moderate aortic valve sclerosis/calcification is present, without any evidence of aortic stenosis. Aortic valve mean gradient measures 6.0 mmHg. Aortic valve peak gradient measures 11.0 mmHg. Aortic valve area, by VTI measures 1.82 cm. Pulmonic Valve: The pulmonic valve was normal in structure. Pulmonic valve regurgitation is not visualized. No evidence of pulmonic stenosis. Aorta: The aortic root is normal in size and structure. Venous: The inferior vena cava is normal in size with greater than 50% respiratory variability, suggesting right atrial pressure of 3 mmHg. IAS/Shunts: No atrial level shunt detected by color flow Doppler.  LEFT VENTRICLE PLAX 2D LVIDd:         3.20 cm  Diastology LVIDs:         2.20 cm  LV e' medial:    6.53 cm/s LV PW:         1.80 cm  LV E/e' medial:  9.5 LV IVS:        1.70 cm  LV e'  lateral:   5.00 cm/s LVOT diam:     2.10 cm  LV E/e' lateral: 12.4 LV SV:         42 LV SV Index:   20 LVOT Area:     3.46 cm  RIGHT VENTRICLE             IVC RV Basal diam:  2.60 cm     IVC diam: 1.30 cm RV S prime:     21.80 cm/s TAPSE (M-mode): 2.4 cm LEFT ATRIUM           Index       RIGHT ATRIUM           Index LA diam:      3.30 cm 1.59 cm/m  RA Area:     12.30 cm LA Vol (A2C): 17.4 ml 8.37 ml/m  RA Volume:   24.50 ml  11.78 ml/m LA Vol (A4C): 35.3 ml 16.97 ml/m  AORTIC VALVE AV Area (Vmax):    1.88 cm AV Area (Vmean):   1.72 cm AV Area (VTI):     1.82 cm AV Vmax:           166.00 cm/s AV Vmean:          115.000 cm/s AV VTI:            0.232 m AV Peak Grad:      11.0 mmHg AV Mean Grad:      6.0 mmHg LVOT Vmax:         90.10 cm/s LVOT Vmean:        57.100 cm/s LVOT VTI:          0.122 m LVOT/AV VTI ratio: 0.53  AORTA Ao Root diam: 3.20 cm Ao Asc diam:  3.00 cm MITRAL VALVE MV Area (PHT): 3.37 cm  SHUNTS MV Decel Time: 225 msec     Systemic VTI:  0.12 m MV E velocity: 61.80 cm/s   Systemic Diam: 2.10 cm MV A velocity: 105.00 cm/s MV E/A ratio:  0.59 Donato Schultz MD Electronically signed by Donato Schultz MD Signature Date/Time: 02/04/2021/5:25:46 PM    Final    VAS [place] LOWER EXTREMITY VENOUS (DVT)  Result Date: 02/07/2021  Lower Venous DVT Study Indications: Edema.  Risk Factors: None identified. Limitations: Poor ultrasound/tissue interface and body habitus. Comparison Study: No prior studies. Performing Technologist: Chanda Busing RVT  Examination Guidelines: A complete evaluation includes B-mode imaging, spectral Doppler, color Doppler, and power Doppler as needed of all accessible portions of each vessel. Bilateral testing is considered an integral part of a complete examination. Limited examinations for reoccurring indications may be performed as noted. The reflux portion of the exam is performed with the patient in reverse Trendelenburg.   +---------+---------------+---------+-----------+----------+--------------+ RIGHT    CompressibilityPhasicitySpontaneityPropertiesThrombus Aging +---------+---------------+---------+-----------+----------+--------------+ CFV      Full           Yes      Yes                                 +---------+---------------+---------+-----------+----------+--------------+ SFJ      Full                                                        +---------+---------------+---------+-----------+----------+--------------+ FV Prox  Full                                                        +---------+---------------+---------+-----------+----------+--------------+ FV Mid   Full                                                        +---------+---------------+---------+-----------+----------+--------------+ FV DistalFull                                                        +---------+---------------+---------+-----------+----------+--------------+ PFV      Full                                                        +---------+---------------+---------+-----------+----------+--------------+ POP      Full           Yes      Yes                                 +---------+---------------+---------+-----------+----------+--------------+ PTV      Full                                                        +---------+---------------+---------+-----------+----------+--------------+  PERO     Full                                                        +---------+---------------+---------+-----------+----------+--------------+   +---------+---------------+---------+-----------+----------+--------------+ LEFT     CompressibilityPhasicitySpontaneityPropertiesThrombus Aging +---------+---------------+---------+-----------+----------+--------------+ CFV      Full           Yes      Yes                                  +---------+---------------+---------+-----------+----------+--------------+ SFJ      Full                                                        +---------+---------------+---------+-----------+----------+--------------+ FV Prox  Full                                                        +---------+---------------+---------+-----------+----------+--------------+ FV Mid   Full                                                        +---------+---------------+---------+-----------+----------+--------------+ FV DistalFull                                                        +---------+---------------+---------+-----------+----------+--------------+ PFV      Full                                                        +---------+---------------+---------+-----------+----------+--------------+ POP      Full           Yes      Yes                                 +---------+---------------+---------+-----------+----------+--------------+ PTV      Full                                                        +---------+---------------+---------+-----------+----------+--------------+ PERO     Full                                                        +---------+---------------+---------+-----------+----------+--------------+  Summary: RIGHT: - There is no evidence of deep vein thrombosis in the lower extremity. However, portions of this examination were limited- see technologist comments above.  - No cystic structure found in the popliteal fossa.  LEFT: - There is no evidence of deep vein thrombosis in the lower extremity. However, portions of this examination were limited- see technologist comments above.  - No cystic structure found in the popliteal fossa.  *See table(s) above for measurements and observations. Electronically signed by Fabienne Bruns MD on 02/07/2021 at 5:53:12 PM.    Final     Labs: BNP (last 3 results) Recent Labs    02/04/21 0617  BNP  40.2   Basic Metabolic Panel: Recent Labs  Lab 02/04/21 1202 02/04/21 2139 02/05/21 0346 02/05/21 1453 02/07/21 0126 02/08/21 0104 02/09/21 0112 02/10/21 0206 02/11/21 0057  NA 142   < > 135   < > 135 134* 135 137 136  K 6.3*   < > 4.0   < > 3.7 3.3* 3.8 4.1 3.7  CL 107   < > 95*   < > 98 100 101 102 99  CO2 15*   < > 26   < > GLUCOSE 87   < > 135*   < > 115* 141* 104* 109* 104*  BUN 20   < > 25*   < > CREATININE 3.22*   < > 2.16*   < > 1.30* 1.18 1.11 1.20 1.19  CALCIUM 8.3*   < > 7.8*   < > 8.2* 8.2* 8.4* 8.4* 8.4*  MG 2.5*  --  2.1  --   --   --   --   --   --   PHOS 5.1*  --  4.8*  --   --   --   --   --   --    < > = values in this interval not displayed.   Liver Function Tests: Recent Labs  Lab 02/06/21 0219 02/07/21 0126 02/08/21 0104 02/09/21 0112 02/10/21 0206  AST 1,243* 938* 783* 659* 451*  ALT 428* 335* 292* 255* 215*  ALKPHOS 50 50 54 47 46  BILITOT 1.1 1.0 0.9 1.1 1.4*  PROT 6.0* 6.0* 5.7* 5.4* 5.3*  ALBUMIN 2.6* 2.5* 2.4* 2.4* 2.3*   Recent Labs  Lab 02/04/21 1150  LIPASE 30  AMYLASE 356*   No results for input(s): AMMONIA in the last 168 hours. CBC: Recent Labs  Lab 02/07/21 0126 02/08/21 0104 02/09/21 0112 02/10/21 0206 02/11/21 0057  WBC 13.7* 12.4* 14.6* 16.6* 13.7*  NEUTROABS 10.8*  --   --   --   --   HGB 13.6 13.3 13.2 12.9* 12.8*  HCT 41.1 39.1 36.6* 37.6* 37.7*  MCV 100.5* 100.5* 96.6 97.9 98.4  PLT 126* 138* 160 179 204   Cardiac Enzymes: Recent Labs  Lab 02/05/21 1453 02/06/21 0219 02/07/21 0126 02/08/21 0104 02/09/21 0112 02/10/21 0206 02/11/21 0057  CKTOTAL >50,000*   < > >50,000* 16,109* 32,759* 20,223* 13,468*  CKMB 178.4*  --   --   --   --   --   --    < > = values in this interval not displayed.   BNP: Invalid input(s): POCBNP CBG: Recent Labs  Lab 02/04/21 1337 02/04/21 1935 02/04/21 2332 02/05/21 0323 02/06/21 0635  GLUCAP 109* 138* 132* 125* 124*   D-Dimer No  results for input(s): DDIMER in the last 72 hours. Hgb  A1c No results for input(s): HGBA1C in the last 72 hours. Lipid Profile No results for input(s): CHOL, HDL, LDLCALC, TRIG, CHOLHDL, LDLDIRECT in the last 72 hours. Thyroid function studies No results for input(s): TSH, T4TOTAL, T3FREE, THYROIDAB in the last 72 hours.  Invalid input(s): FREET3 Anemia work up No results for input(s): VITAMINB12, FOLATE, FERRITIN, TIBC, IRON, RETICCTPCT in the last 72 hours. Urinalysis    Component Value Date/Time   COLORURINE YELLOW 02/04/2021 0818   APPEARANCEUR CLEAR 02/04/2021 0818   LABSPEC 1.003 (L) 02/04/2021 0818   PHURINE 6.0 02/04/2021 0818   GLUCOSEU NEGATIVE 02/04/2021 0818   HGBUR LARGE (A) 02/04/2021 0818   BILIRUBINUR NEGATIVE 02/04/2021 0818   KETONESUR NEGATIVE 02/04/2021 0818   PROTEINUR 30 (A) 02/04/2021 0818   UROBILINOGEN 1.0 06/12/2015 2102   NITRITE NEGATIVE 02/04/2021 0818   LEUKOCYTESUR NEGATIVE 02/04/2021 0818   Sepsis Labs Invalid input(s): PROCALCITONIN,  WBC,  LACTICIDVEN Microbiology Recent Results (from the past 240 hour(s))  Culture, blood (Routine X 2) w Reflex to ID Panel     Status: None   Collection Time: 02/04/21  6:50 AM   Specimen: BLOOD  Result Value Ref Range Status   Specimen Description BLOOD RIGHT ANTECUBITAL  Final   Special Requests   Final    BOTTLES DRAWN AEROBIC ONLY Blood Culture results may not be optimal due to an inadequate volume of blood received in culture bottles   Culture   Final    NO GROWTH 5 DAYS Performed at [facility], [address], [place], [place] [address]    Report Status 02/09/2021 FINAL  Final  Culture, blood (Routine X 2) w Reflex to ID Panel     Status: None   Collection Time: 02/04/21  7:55 AM   Specimen: BLOOD  Result Value Ref Range Status   Specimen Description BLOOD LEFT ANTECUBITAL  Final   Special Requests   Final    BOTTLES DRAWN AEROBIC AND ANAEROBIC Blood Culture results may not be optimal due  to an inadequate volume of blood received in culture bottles   Culture   Final    NO GROWTH 5 DAYS Performed at [facility], [address] [place], [place] [address]    Report Status 02/09/2021 FINAL  Final  Urine Culture     Status: None   Collection Time: 02/04/21  8:25 AM   Specimen: Urine, Random  Result Value Ref Range Status   Specimen Description URINE, RANDOM  Final   Special Requests NONE  Final   Culture   Final    NO GROWTH Performed at [facility], [address], [place], [place] [address]    Report Status 02/05/2021 FINAL  Final  Resp Panel by RT-PCR (Flu A&B, Covid) Nasopharyngeal Swab     Status: None   Collection Time: 02/04/21  9:25 AM   Specimen: Nasopharyngeal Swab; Nasopharyngeal(NP) swabs in vial transport medium  Result Value Ref Range Status   SARS Coronavirus 2 by RT PCR NEGATIVE NEGATIVE Final    Comment: (NOTE) SARS-CoV-2 target nucleic acids are NOT DETECTED.  The SARS-CoV-2 RNA is generally detectable in upper respiratory specimens during the acute phase of infection. The lowest concentration of SARS-CoV-2 viral copies this assay can detect is 138 copies/mL. A negative result does not preclude SARS-Cov-2 infection and should not be used as the sole basis for treatment or other patient management decisions. A negative result may occur with  improper specimen collection/handling, submission of specimen other  than nasopharyngeal swab, presence of viral mutation(s) within the areas targeted by this assay, and inadequate number of viral copies(<138 copies/mL). A negative result must be combined with clinical observations, patient history, and epidemiological information. The expected result is Negative.  Fact Sheet for Patients:  BloggerCourse.com  Fact Sheet for Healthcare Providers:  SeriousBroker.it  This test is no t yet approved or cleared by the [place] FDA and  has been  authorized for detection and/or diagnosis of SARS-CoV-2 by FDA under an Emergency Use Authorization (EUA). This EUA will remain  in effect (meaning this test can be used) for the duration of the COVID-19 declaration under Section 564(b)(1) of the Act, 21 U.S.C.section 360bbb-3(b)(1), unless the authorization is terminated  or revoked sooner.       Influenza A by PCR NEGATIVE NEGATIVE Final   Influenza B by PCR NEGATIVE NEGATIVE Final    Comment: (NOTE) The Xpert Xpress SARS-CoV-2/FLU/RSV plus assay is intended as an aid in the diagnosis of influenza from Nasopharyngeal swab specimens and should not be used as a sole basis for treatment. Nasal washings and aspirates are unacceptable for Xpert Xpress SARS-CoV-2/FLU/RSV testing.  Fact Sheet for Patients: BloggerCourse.com  Fact Sheet for Healthcare Providers: SeriousBroker.it  This test is not yet approved or cleared by the [place] FDA and has been authorized for detection and/or diagnosis of SARS-CoV-2 by FDA under an Emergency Use Authorization (EUA). This EUA will remain in effect (meaning this test can be used) for the duration of the COVID-19 declaration under Section 564(b)(1) of the Act, 21 U.S.C. section 360bbb-3(b)(1), unless the authorization is terminated or revoked.  Performed at [facility], [address], [place], [place] [address]   MRSA PCR Screening     Status: None   Collection Time: 02/04/21  1:37 PM   Specimen: Nasal Mucosa; Nasopharyngeal  Result Value Ref Range Status   MRSA by PCR NEGATIVE NEGATIVE Final    Comment:        The GeneXpert MRSA Assay (FDA approved for NASAL specimens only), is one component of a comprehensive MRSA colonization surveillance program. It is not intended to diagnose MRSA infection nor to guide or monitor treatment for MRSA infections. Performed at [facility], [address] [place], [place]  [address]   Blood Culture (routine x 2)     Status: None   Collection Time: 02/04/21  2:22 PM   Specimen: BLOOD RIGHT HAND  Result Value Ref Range Status   Specimen Description BLOOD RIGHT HAND  Final   Special Requests   Final    BOTTLES DRAWN AEROBIC ONLY Blood Culture results may not be optimal due to an inadequate volume of blood received in culture bottles   Culture   Final    NO GROWTH 5 DAYS Performed at [facility], [address], [place], [place] [address]    Report Status 02/09/2021 FINAL  Final  Blood Culture (routine x 2)     Status: None   Collection Time: 02/04/21  2:37 PM   Specimen: BLOOD LEFT HAND  Result Value Ref Range Status   Specimen Description BLOOD LEFT HAND  Final   Special Requests   Final    BOTTLES DRAWN AEROBIC ONLY Blood Culture results may not be optimal due to an inadequate volume of blood received in culture bottles   Culture   Final    NO GROWTH 5 DAYS Performed at [facility], [address], [place], [place]  [address]    Report Status 02/09/2021 FINAL  Final     Time coordinating discharge: 35 minutes  SIGNED: Lanae Boast, MD  [facility] 02/11/2021, 9:25 AM  If 7PM-7AM, please contact night-coverage [email or web address]

## 2021-02-11 NOTE — Progress Notes (Signed)
Pt declined the use of CPAP for the night. Pt stated he does not need it at this time. RT will continue to monitor.

## 2021-02-11 NOTE — H&P (Signed)
Physical Medicine and Rehabilitation Admission H&P    Chief Complaint  Patient presents with  . Altered Mental Status  : HPI: Shane Guzman is a 45 year old right-handed male with history of hypertension as well as OSA on CPAP, tobacco/alcohol use, sigmoid diverticulitis with perforation 2012 and marginal medical compliance..  Per chart review patient lives alone.  Independent prior to admission.  Works as a Estate agent.  1 level home multiple steps to entry.  He does have a close friend who can assist as needed.  Presented 02/04/2021 after being found down, he was diaphoretic and foaming at the mouth.  EMS was activated he had a pulse with agonal respiratory pattern and hypoxemia.  He did receive Narcan.  Patient was hypotensive with IV fluids initiated.  Patient did not require intubation.  Cranial and cervical CT scan.  Chest x-ray showed possible aspiration pneumonia.  Negative CT of abdomen pelvis showed mild bilateral posterior basilar subsegmental atelectasis and/or infiltrates.  Focal wall thickening seen involving proximal sigmoid colon with minimal surrounding inflammatory changes.  It was uncertain if this represented acute diverticulitis or sequela of previous such inflammation.  Admission chemistries potassium 6.3, creatinine 3.22, CK greater than 50,000, lipase 30, amylase 356, troponin I 63-1 96, ammonia level 42, alcohol level 84, urine culture no growth, urine drug screen negative, blood cultures no growth to date, BNP 40.2.  WBC 15,000, elevated LFTs.  Hemoglobin 17.  Echocardiogram with ejection fraction of 60 to 65% grade 1 diastolic dysfunction.  Patient's elevated troponin felt to be related to demand ischemia.  Nephrology services consulted for AKI as well as rhabdo/hyperkalemia.  Renal ultrasound negative for hydronephrosis.  Patient responded well to IV fluids latest creatinine 1.19 and latest CK 13,468.  LFTs have been elevated suspect secondary to rhabdo continue to  improve.  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 Horald Chestnut.  Maintained on subcutaneous heparin for DVT prophylaxis.  Patient is completing course of Augmentin for question aspiration pneumonia.  He is tolerating a regular diet.  Physical Medicine & Rehabilitation was consulted to assess candidacy for CIR given impaired mobility.  Review of Systems  Constitutional: Negative for chills and fever.  HENT: Negative for hearing loss.   Eyes: Negative for blurred vision and double vision.  Respiratory: Positive for shortness of breath. Negative for cough.   Cardiovascular: Negative for chest pain, palpitations and leg swelling.  Gastrointestinal: Positive for nausea.  Genitourinary: Negative for dysuria, flank pain and hematuria.  Musculoskeletal: Positive for joint pain and myalgias.  Skin: Negative for rash.  Neurological: Positive for weakness. Negative for seizures.  All other systems reviewed and are negative.  Past Medical History:  Diagnosis Date  . Diverticulitis of intestine with abscess   . Heart murmur   . Hypertension   . OSA on CPAP    Past Surgical History:  Procedure Laterality Date  . ACHILLES TENDON REPAIR  06/2010   left; S/P tendon rupture   Family History  Problem Relation Age of Onset  . Diverticulitis Maternal Grandmother    Social History:  reports that he has been smoking cigarettes. He has been smoking about 0.00 packs per day for the past 18.00 years. He has never used smokeless tobacco. He reports current alcohol use. He reports that he does not use drugs. Allergies:  Allergies  Allergen Reactions  .  Lisinopril Swelling   Medications Prior to Admission  Medication Sig Dispense Refill  . sildenafil (REVATIO) 20 MG tablet Take 20 mg by mouth daily as needed (ed).     . terbinafine (LAMISIL) 250 MG tablet Take 1 tablet (250 mg total) by mouth daily. 90 tablet 0  . valsartan-hydrochlorothiazide (DIOVAN-HCT) 160-12.5 MG tablet Take 1 tablet by mouth daily.      Drug Regimen Review Drug regimen was reviewed and remains appropriate with no significant issues identified  Home: Home Living Family/patient expects to be discharged to:: Private residence Living Arrangements: Alone Available Help at Discharge: Family,Friend(s),Available 24 hours/day (per friend [name]) Type of Home: House Home Access: Stairs to enter Entergy CorporationEntrance Stairs-Number of Steps: 11 Entrance Stairs-Rails: Left Home Layout: One level Bathroom Shower/Tub: Engineer, manufacturing systemsTub/shower unit Bathroom Toilet: Standard Bathroom Accessibility: Yes Home Equipment: None   Functional History: Prior Function Level of Independence: Independent 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  Functional Status:  Mobility: Bed Mobility Overal bed mobility: Needs Assistance Bed Mobility: Rolling,Sit to Sidelying Rolling: Supervision Sidelying to sit: Min guard,HOB elevated Supine to sit: Mod assist,HOB elevated Sit to sidelying: Min assist General bed mobility comments: cues for log rolling for back/abdominal comfort, heavy use of bed features/rail, minA for BLE Transfers Overall transfer level: Needs assistance Equipment used: Rolling walker (2 wheeled) Transfers: Sit to/from Stand Sit to Stand: +2 safety/equipment,Min guard  Lateral/Scoot Transfers: Mod assist,+2 safety/equipment (2nd person to hold chair in place) General transfer comment: from chair/EOB, pt impulsive to stand and heavy BUE reliance on RW, cues for safety Ambulation/Gait Ambulation/Gait assistance: Min assist Gait Distance (Feet): 30 Feet (4630ft, seated break, 1320ft) Assistive device: Rolling walker (2 wheeled) Gait Pattern/deviations: Step-to pattern,Decreased stance time - left,Decreased dorsiflexion - left,Decreased weight shift to  left,Antalgic,Wide base of support General Gait Details: close chair follow, fair use of RW and able to place L forefoot on floor but still has antalgic gait due to moderate LLE pain; no LOB but fatigues quickly Gait velocity: slowed Gait velocity interpretation: <1.31 ft/sec, indicative of household ambulator    ADL: ADL Overall ADL's : Needs assistance/impaired Eating/Feeding: Independent,Bed level Grooming: Set up,Bed level Upper Body Bathing: Set up,Bed level Lower Body Bathing: Total assistance,Bed level Upper Body Dressing : Minimal assistance,Bed level Lower Body Dressing: Total assistance,Bed level  Cognition: Cognition Overall Cognitive Status: Impaired/Different from baseline Orientation Level: Oriented X4 Cognition Arousal/Alertness: Awake/alert Behavior During Therapy: WFL for tasks assessed/performed,Impulsive Overall Cognitive Status: Impaired/Different from baseline Area of Impairment: Memory,Safety/judgement,Problem solving Memory: Decreased short-term memory Safety/Judgement: Decreased awareness of safety,Decreased awareness of deficits Problem Solving: Requires verbal cues General Comments: somewhat impulsive to get up, cues for safety and pacing, pulling up on RW with poor carryover of cues to push from bed/chair  Physical Exam: Blood pressure (!) 157/90, pulse 87, temperature 98.8 F (37.1 C), temperature source Oral, resp. rate 18, height 5\' 4"  (1.626 m), weight 107.2 kg, SpO2 97 %. Physical Exam Gen: no distress, normal appearing, friend is at bedside HEENT: oral mucosa pink and moist, NCAT Cardio: Reg rate Chest: normal effort, normal rate of breathing Abd: soft, non-distended Ext: LLE with 1+ edema Psych: pleasant, flat affect Skin: intact Neurological:  Patient is alert.  No acute distress.   Oriented x3 and follows commands.  Upper extremities 5/5 bilaterally LLE 3/5 throughout, limited by pain and edema. 0/5 DF and PF   Results for orders  placed or performed during the hospital encounter of 02/04/21 (from the past 48 hour(s))  CK  Status: Abnormal   Collection Time: 02/10/21  2:06 AM  Result Value Ref Range   Total CK 20,223 (H) 49 - 397 U/L    Comment: RESULTS CONFIRMED BY MANUAL DILUTION Performed at [facility], [address], [place], [place] [address]   CBC     Status: Abnormal   Collection Time: 02/10/21  2:06 AM  Result Value Ref Range   WBC 16.6 (H) 4.0 - 10.5 K/uL   RBC 3.84 (L) 4.22 - 5.81 MIL/uL   Hemoglobin 12.9 (L) 13.0 - 17.0 g/dL   HCT 60.4 (L) 54.0 - 98.1 %   MCV 97.9 80.0 - 100.0 fL   MCH 33.6 26.0 - 34.0 pg   MCHC 34.3 30.0 - 36.0 g/dL   RDW 19.1 47.8 - 29.5 %   Platelets 179 150 - 400 K/uL   nRBC 0.0 0.0 - 0.2 %    Comment: Performed at [facility], [address] [place], [place] [address]  Comprehensive metabolic panel     Status: Abnormal   Collection Time: 02/10/21  2:06 AM  Result Value Ref Range   Sodium 137 135 - 145 mmol/L   Potassium 4.1 3.5 - 5.1 mmol/L    Comment: SLIGHT HEMOLYSIS   Chloride 102 98 - 111 mmol/L   CO2 25 22 - 32 mmol/L   Glucose, Bld 109 (H) 70 - 99 mg/dL    Comment: Glucose reference range applies only to samples taken after fasting for at least 8 hours.   BUN 14 6 - 20 mg/dL   Creatinine, Ser 8.65 0.61 - 1.24 mg/dL   Calcium 8.4 (L) 8.9 - 10.3 mg/dL   Total Protein 5.3 (L) 6.5 - 8.1 g/dL   Albumin 2.3 (L) 3.5 - 5.0 g/dL   AST 784 (H) 15 - 41 U/L   ALT 215 (H) 0 - 44 U/L   Alkaline Phosphatase 46 38 - 126 U/L   Total Bilirubin 1.4 (H) 0.3 - 1.2 mg/dL   GFR, Estimated >69 >62 mL/min    Comment: (NOTE) Calculated using the CKD-EPI Creatinine Equation (2021)    Anion gap 10 5 - 15    Comment: Performed at [facility], [address] [place], [place] [address]  Procalcitonin - Baseline     Status: None   Collection Time: 02/10/21  2:06 AM  Result Value Ref Range   Procalcitonin 0.45 ng/mL    Comment:        Interpretation: PCT  (Procalcitonin) <= 0.5 ng/mL: Systemic infection (sepsis) is not likely. Local bacterial infection is possible. (NOTE)       Sepsis PCT Algorithm           Lower Respiratory Tract                                      Infection PCT Algorithm    ----------------------------     ----------------------------         PCT < 0.25 ng/mL                PCT < 0.10 ng/mL          Strongly encourage             Strongly discourage   discontinuation of antibiotics    initiation of antibiotics    ----------------------------     -----------------------------       PCT 0.25 -  0.50 ng/mL            PCT 0.10 - 0.25 ng/mL               OR       >80% decrease in PCT            Discourage initiation of                                            antibiotics      Encourage discontinuation           of antibiotics    ----------------------------     -----------------------------         PCT >= 0.50 ng/mL              PCT 0.26 - 0.50 ng/mL               AND        <80% decrease in PCT             Encourage initiation of                                             antibiotics       Encourage continuation           of antibiotics    ----------------------------     -----------------------------        PCT >= 0.50 ng/mL                  PCT > 0.50 ng/mL               AND         increase in PCT                  Strongly encourage                                      initiation of antibiotics    Strongly encourage escalation           of antibiotics                                     -----------------------------                                           PCT <= 0.25 ng/mL                                                 OR                                        > 80% decrease in PCT  Discontinue / Do not initiate                                             antibiotics  Performed at [facility], [address], [place], [place] [address]   CK     Status:  Abnormal   Collection Time: 02/11/21 12:57 AM  Result Value Ref Range   Total CK 13,468 (H) 49 - 397 U/L    Comment: RESULTS CONFIRMED BY MANUAL DILUTION Performed at [facility], [address], [place], [place] [address]   Procalcitonin     Status: None   Collection Time: 02/11/21 12:57 AM  Result Value Ref Range   Procalcitonin 0.37 ng/mL    Comment:        Interpretation: PCT (Procalcitonin) <= 0.5 ng/mL: Systemic infection (sepsis) is not likely. Local bacterial infection is possible. (NOTE)       Sepsis PCT Algorithm           Lower Respiratory Tract                                      Infection PCT Algorithm    ----------------------------     ----------------------------         PCT < 0.25 ng/mL                PCT < 0.10 ng/mL          Strongly encourage             Strongly discourage   discontinuation of antibiotics    initiation of antibiotics    ----------------------------     -----------------------------       PCT 0.25 - 0.50 ng/mL            PCT 0.10 - 0.25 ng/mL               OR       >80% decrease in PCT            Discourage initiation of                                            antibiotics      Encourage discontinuation           of antibiotics    ----------------------------     -----------------------------         PCT >= 0.50 ng/mL              PCT 0.26 - 0.50 ng/mL               AND        <80% decrease in PCT             Encourage initiation of                                             antibiotics       Encourage continuation           of antibiotics    ----------------------------     -----------------------------  PCT >= 0.50 ng/mL                  PCT > 0.50 ng/mL               AND         increase in PCT                  Strongly encourage                                      initiation of antibiotics    Strongly encourage escalation           of antibiotics                                     -----------------------------                                            PCT <= 0.25 ng/mL                                                 OR                                        > 80% decrease in PCT                                      Discontinue / Do not initiate                                             antibiotics  Performed at [facility], [address], [place], [place] [address]   Basic metabolic panel     Status: Abnormal   Collection Time: 02/11/21 12:57 AM  Result Value Ref Range   Sodium 136 135 - 145 mmol/L   Potassium 3.7 3.5 - 5.1 mmol/L   Chloride 99 98 - 111 mmol/L   CO2 27 22 - 32 mmol/L   Glucose, Bld 104 (H) 70 - 99 mg/dL    Comment: Glucose reference range applies only to samples taken after fasting for at least 8 hours.   BUN 14 6 - 20 mg/dL   Creatinine, Ser 6.22 0.61 - 1.24 mg/dL   Calcium 8.4 (L) 8.9 - 10.3 mg/dL   GFR, Estimated >63 >33 mL/min    Comment: (NOTE) Calculated using the CKD-EPI Creatinine Equation (2021)    Anion gap 10 5 - 15    Comment: Performed at [facility], [address], [place], [place] [address]  CBC     Status: Abnormal   Collection Time: 02/11/21 12:57 AM  Result Value Ref Range   WBC 13.7 (H) 4.0 - 10.5 K/uL   RBC 3.83 (L) 4.22 - 5.81 MIL/uL  Hemoglobin 12.8 (L) 13.0 - 17.0 g/dL   HCT 12.8 (L) 78.6 - 76.7 %   MCV 98.4 80.0 - 100.0 fL   MCH 33.4 26.0 - 34.0 pg   MCHC 34.0 30.0 - 36.0 g/dL   RDW 20.9 47.0 - 96.2 %   Platelets 204 150 - 400 K/uL   nRBC 0.0 0.0 - 0.2 %    Comment: Performed at [facility], [address], [place], [place] [address]   No results found.     Medical Problem List and Plan: 1.   Debility secondary to AKI from combination of volume contraction/pigment nephropathy/acute metabolic encephalopathy from rhabdo.  Cranial CT scan negative  -patient may shower  -ELOS/Goals: modI in 7-8 days 2.  Antithrombotics: -DVT/anticoagulation: Subcutaneous heparin  -antiplatelet therapy: N/A 3. Pain  Management: Robaxin and oxycodone as needed 4. Mood: Provide emotional support  -antipsychotic agents: N/A 5. Neuropsych: This patient is capable of making decisions on his own behalf. 6. Skin/Wound Care: Routine skin checks 7. Fluids/Electrolytes/Nutrition: Routine in and outs with follow-up chemistries 8.  Left foot drop/left peroneal nerve compression.  Follow-up outpatient nerve conduction study. Will benefit from an AFO- will order 9.  Leukocytosis question aspiration.  Continue Augmentin x2 more days and stop. 10.  History of alcohol tobacco use.  Provide counseling 11.  Abnormal LFTs/transaminitis due to rhabdo.  Follow-up liver function studies 12.  Hypertension.  Lopressor 12.5 mg twice daily.  Monitor with increased mobility 13.  AKI from combination of volume contraction/rhabdo.  Continue IV fluids x24 more hours.  Cr currently 1.19- resolved, continue to monitor.  Nephrology follow-up 14.  Morbid obesity.  BMI 40.57.  Dietary follow-up 15.  Constipation.  MiraLAX daily, Senokot twice daily.  Colace twice daily as needed 16.  OSA.  Continue CPAP 17.  History of sigmoid diverticulitis perforation 2012.  Follow-up outpatient 18. Rhabdomyolysis: CK is trending downward, continue to monitor,   Charlton Amor, PA-C 02/11/2021

## 2021-02-11 NOTE — Progress Notes (Signed)
Signed        PMR Admission Coordinator Pre-Admission Assessment   Patient: Shane Guzman is an 45 y.o., male MRN: [id number] DOB: 06-23-1976 Height: '5\' 4"'  (162.6 cm) Weight: 107.2 kg   Insurance Information HMO:     PPO: yes     PCP:      IPA:      80/20:      OTHER:  PRIMARY: [employer]     Policy#: [id number]      Subscriber: patient CM Name: Roselyn Reef for Leeroy Bock from [place]    Phone#: [phone]     Fax#: [phone] Pre-Cert#: [id number]   Approved for 5 days beginning 2/25 to 02/15/21 with updates due on 02/16/21      Employer:  Benefits:  Phone #: [phone]     Name: Christinia Gully. Date: 12/18/20-still active     Deduct: $1.500 ($0 met) Out of Pocket Max: $4,000 ($0 met)      Life Max: NA CIR: 85% coverage, 15% co-insurance      SNF: 85% coverage, 15% co-insurance; 100 per cal/yr Outpatient: 85% coverage, 15% co-insurance; prior auth required, 60 days/cal year     Co-Pay:  Home Health: 85% coverage, 15% co-insurance; 120 visits/cal yr combined      Co-Pay:  DME: 85 % coverage, 15% co-insurance, prior auth required     Co-Pay:  Providers: in-network  SECONDARY:       Policy#:      Phone#:    Development worker, community:       Phone#:    The Engineer, petroleum" for patients in Inpatient Rehabilitation Facilities with attached "Privacy Act [place] Records" was provided and verbally reviewed with: Patient   Emergency Contact Information         Contact Information     Name Relation Home Work Mobile    Marks,Shirley Grandmother [place], [place] other [phone]        Irving Burton Father     [phone]         Current Medical History  Patient Admitting Diagnosis: ARF History of Present Illness: 45 year old male with morbid obesity/OSA on CPAP with marginal compliance, tobacco and alcohol abuse, hypertension, chronic back pain, history of sigmoid diverticulitis with perforation in 2012 to the  ED on 2/18 after being unresponsive and difficult to arouse in the early morning at home was diaphoretic with foaming of the mouth but no witnessed seizure activity.Apparently he has not been using CPAP.  Was brought by EMS, had a pulse with agonal respiratory pattern and hypoxia status post Narcan with some improvement in mental status with agitation then received Ativan on arrival to the ED and 6 L nasal cannula oxygen, was hypotensive with lactic acidosis 8.2, renal failure, metabolic and respiratory acidosis, internal positivity for drug screen was negative, etoh 84 lab with rhabdomyolysis leukocytosis transaminitis renal failure hyperkalemia acidosis. CT abdomen with mild bilateral posterior basilar subsegmental atelectasis versus infiltrate but no hydronephrosis, normal liver and pancreas with sigmoid diverticulosis with some focal wall thickening in the proximal sigmoid and minimal surrounding inflammatory change, negative CT head/CT C-spine. Empirically placed on vancomycin and cefepime for possible septic shock, with aggressive IV fluid hydration. Patient was admitted to critical care-treated for acute metabolic encephalopathy/hypoxic respiratory failure/rhabdomyolysis with severe AKI hyperkalemia metabolic acidosis.  Seen by nephrology. Patient has been also complaining of left lower leg weakness/foot drop and edema pain diminished sensation-seen by Dr. Dirk Dress orthopedics  2/20 no evidence of compartment syndrome blood flow likely from peroneal nerve compression advised nerve conduction study. 2/21-He remembers hurting on the back ( can't' remember where else" remembers waking up in the hospital and since then c/o back pain and can't feel the left leg and cannot move it and it is hurting too.reports he has chronic back pain he sees which is unchanged. He is able to flex his left knee.  MRI lumbar spine was done limited with motion artifact but shows "Extensive edema is present in the right paraspinous  muscles without fluid collection." Pt. 's hypokalemia, respiratory failure, transminitis, and encephalopathy all resolving with treatment.    Patient's medical record from [facility] has been reviewed by the rehabilitation admission coordinator and physician.   Past Medical History      Past Medical History:  Diagnosis Date  . Diverticulitis of intestine with abscess    . Heart murmur    . Hypertension    . OSA on CPAP        Family History   family history includes Diverticulitis in his maternal grandmother.   Prior Rehab/Hospitalizations Has the patient had prior rehab or hospitalizations prior to admission? No   Has the patient had major surgery during 100 days prior to admission? No               Current Medications   Current Facility-Administered Medications:  .  0.9 %  sodium chloride infusion, , Intravenous, Continuous, Kc, Ramesh, MD, Last Rate: 100 mL/hr at 02/10/21 2303, New Bag at 02/10/21 2303 .  0.9 %  sodium chloride infusion, , Intravenous, PRN, Erick Colace, NP, Last Rate: 10 mL/hr at 02/05/21 1100, Infusion Verify at 02/05/21 1100 .  amoxicillin-clavulanate (AUGMENTIN) 875-125 MG per tablet 1 tablet, 1 tablet, Oral, Q12H, Erick Colace, NP, 1 tablet at 02/11/21 [phone] .  bisacodyl (DULCOLAX) suppository 10 mg, 10 mg, Rectal, Daily PRN, Anders Simmonds, MD .  Chlorhexidine Gluconate Cloth 2 % PADS 6 each, 6 each, Topical, Daily, Erick Colace, NP, 6 each at 02/09/21 0901 .  docusate sodium (COLACE) capsule 100 mg, 100 mg, Oral, BID PRN, Erick Colace, NP, 100 mg at 02/05/21 1819 .  famotidine (PEPCID) tablet 10 mg, 10 mg, Oral, Daily, Florencia Reasons, MD, 10 mg at 02/11/21 0806 .  heparin injection 5,000 Units, 5,000 Units, Subcutaneous, Q8H, Erick Colace, NP, 5,000 Units at 02/11/21 0511 .  influenza vac split quadrivalent PF (FLUARIX) injection 0.5 mL, 0.5 mL, Intramuscular, Tomorrow-1000, Kc, Ramesh, MD .  methocarbamol (ROBAXIN) tablet  750 mg, 750 mg, Oral, Q6H PRN, Kc, Ramesh, MD, 750 mg at 02/11/21 0521 .  metoprolol tartrate (LOPRESSOR) tablet 12.5 mg, 12.5 mg, Oral, BID, Erick Colace, NP, 12.5 mg at 02/11/21 0806 .  morphine 2 MG/ML injection 2 mg, 2 mg, Intravenous, Q4H PRN, Kc, Ramesh, MD, 2 mg at 02/11/21 0322 .  ondansetron (ZOFRAN) injection 4 mg, 4 mg, Intravenous, Q6H PRN, Erick Colace, NP, 4 mg at 02/05/21 1647 .  oxyCODONE (Oxy IR/ROXICODONE) immediate release tablet 5 mg, 5 mg, Oral, Q4H PRN, Kc, Ramesh, MD, 5 mg at 02/11/21 0806 .  polyethylene glycol (MIRALAX / GLYCOLAX) packet 17 g, 17 g, Oral, Daily, Florencia Reasons, MD, 17 g at 02/11/21 0806 .  senna-docusate (Senokot-S) tablet 1 tablet, 1 tablet, Oral, BID, Florencia Reasons, MD, 1 tablet at 02/11/21 [phone] .  simethicone (MYLICON) chewable tablet 80 mg, 80 mg, Oral, QID PRN, Florencia Reasons,  MD, 80 mg at 02/08/21 1146 .  traZODone (DESYREL) tablet 50 mg, 50 mg, Oral, QHS PRN, Kc, Ramesh, MD, 50 mg at 02/10/21 2243   Patients Current Diet:     Diet Order                      Diet - low sodium heart healthy              Diet Heart Room service appropriate? Yes with Assist; Fluid consistency: Thin  Diet effective now                      Precautions / Restrictions Precautions Precautions: Fall Precaution Comments: LLE edema/numbness/foot drop Restrictions Weight Bearing Restrictions: Yes RLE Weight Bearing: Weight bearing as tolerated LLE Weight Bearing: Weight bearing as tolerated LLE Partial Weight Bearing Percentage or Pounds: 30 Other Position/Activity Restrictions: "WBAT with functional AFO" per ortho MD note 2/20    Has the patient had 2 or more falls or a fall with injury in the past year? No   Prior Activity Level Community (5-7x/wk): Pt. active in the community and working PTA   Prior Functional Level Self Care: Did the patient need help bathing, dressing, using the toilet or eating? Independent   Indoor Mobility: Did the patient need assistance  with walking from room to room (with or without device)? Independent   Stairs: Did the patient need assistance with internal or external stairs (with or without device)? Independent   Functional Cognition: Did the patient need help planning regular tasks such as shopping or remembering to take medications? Independent   Home Assistive Devices / Equipment Home Equipment: None   Prior Device Use: Indicate devices/aids used by the patient prior to current illness, exacerbation or injury? None of the above   Current Functional Level Cognition   Overall Cognitive Status: Impaired/Different from baseline Orientation Level: Oriented X4 Safety/Judgement: Decreased awareness of safety,Decreased awareness of deficits General Comments: somewhat impulsive to get up, cues for safety and pacing, pulling up on RW with poor carryover of cues to push from bed/chair    Extremity Assessment (includes Sensation/Coordination)   Upper Extremity Assessment: Overall WFL for tasks assessed  Lower Extremity Assessment: LLE deficits/detail LLE Deficits / Details: extremely swollen, especially hip/thigh, pt with no sensation or proprioception sensation ie, can't tell if foot was up or down, however is able to complete quad set, move ankle in DF/PF, glut sets and initiate L LE LAQ at EOB but can't not feel anything LLE Sensation: decreased light touch,decreased proprioception     ADLs   Overall ADL's : Needs assistance/impaired Eating/Feeding: Independent,Bed level Grooming: Set up,Bed level Upper Body Bathing: Set up,Bed level Lower Body Bathing: Total assistance,Bed level Upper Body Dressing : Minimal assistance,Bed level Lower Body Dressing: Total assistance,Bed level     Mobility   Overal bed mobility: Needs Assistance Bed Mobility: Rolling,Sit to Sidelying Rolling: Supervision Sidelying to sit: Min guard,HOB elevated Supine to sit: Mod assist,HOB elevated Sit to sidelying: Min assist General bed  mobility comments: cues for log rolling for back/abdominal comfort, heavy use of bed features/rail, minA for BLE     Transfers   Overall transfer level: Needs assistance Equipment used: Rolling walker (2 wheeled) Transfers: Sit to/from Stand Sit to Stand: +2 safety/equipment,Min guard  Lateral/Scoot Transfers: Mod assist,+2 safety/equipment (2nd person to hold chair in place) General transfer comment: from chair/EOB, pt impulsive to stand and heavy BUE reliance on RW, cues for safety  Ambulation / Gait / Stairs / Wheelchair Mobility   Ambulation/Gait Ambulation/Gait assistance: Herbalist (Feet): 30 Feet (60f, seated break, 258f Assistive device: Rolling walker (2 wheeled) Gait Pattern/deviations: Step-to pattern,Decreased stance time - left,Decreased dorsiflexion - left,Decreased weight shift to left,Antalgic,Wide base of support General Gait Details: close chair follow, fair use of RW and able to place L forefoot on floor but still has antalgic gait due to moderate LLE pain; no LOB but fatigues quickly Gait velocity: slowed Gait velocity interpretation: <1.31 ft/sec, indicative of household ambulator     Posture / Balance Dynamic Sitting Balance Sitting balance - Comments: Supervision, no LOB U UE support Balance Overall balance assessment: Needs assistance Sitting-balance support: Feet supported,Bilateral upper extremity supported Sitting balance-Leahy Scale: Fair Sitting balance - Comments: Supervision, no LOB U UE support Standing balance-Leahy Scale: Poor Standing balance comment: requires heavy UE support on RW     Special needs/care consideration Continuous/IVPB: 0.9% sodium chloride infusion: 10036mr and Designated visitor [name] Potts, significant other, ShiRadames Mejoradorandmother    Previous Home Environment (from acute therapy documentation) Living Arrangements: Alone Available Help at Discharge: Family,Friend(s),Available 24 hours/day (per friend  [name]) Type of Home: House Home Layout: One level Home Access: Stairs to enter Entrance Stairs-Rails: Left Entrance Stairs-Number of Steps: 11 Bathroom Shower/Tub: TubChiropodisttandard Bathroom Accessibility: Yes How Accessible: Accessible via walker HomGreenupes   Discharge Living Setting Plans for Discharge Living Setting: Patient's home Type of Home at Discharge: Apartment Discharge Home Layout: One level Discharge Home Access: Stairs to enter Entrance Stairs-Rails: Left,Can reach both,Right Entrance Stairs-Number of Steps: 11 Discharge Bathroom Shower/Tub: Tub/shower unit Discharge Bathroom Toilet: Standard Discharge Bathroom Accessibility: Yes How Accessible: Accessible via walker   Social/Family/Support Systems Patient Roles: Partner Contact Information: [phone] Caregiver: AngHollice Gongirlfriend) Anticipated Caregiver's Contact Information: [phone]/Limitations of Caregiver: 24/7 Caregiver Availability: 24/7 Discharge Plan Discussed with Primary Caregiver: Yes Is Caregiver In Agreement with Plan?: Yes Does Caregiver/Family have Issues with Lodging/Transportation while Pt is in Rehab?: Yes   Goals Patient/Family Goal for Rehab: PT/ OT Mod I Expected length of stay: 8-10 days Pt/Family Agrees to Admission and willing to participate: Yes Program Orientation Provided & Reviewed with Pt/Caregiver Including Roles  & Responsibilities: Yes   Decrease burden of Care through IP rehab admission: Specialzed equipment needs, Decrease number of caregivers, Bowel and bladder program and Patient/family education   Possible need for SNF placement upon discharge: not anticipated   Patient Condition: [facility], and patient and family member. I met with patient at the bedside for inpatient rehabilitation assessment.  Patient will benefit from ongoing PT  and OT, can actively participate in 3 hours of therapy a day 5 days of the week, and can make measurable gains during the admission.  Patient will also benefit from the coordinated team approach during an Inpatient Acute Rehabilitation admission.  The patient will receive intensive therapy as well as Rehabilitation physician, nursing, social worker, and care management interventions.  Due to safety, skin/wound care, disease management, medication administration, pain management and patient education the patient requires 24 hour a day rehabilitation nursing.  The patient is currently min A with mobility and Min A- total A with basic ADLs.  Discharge setting and therapy post discharge at home with home health is anticipated.  Patient has agreed to participate in the Acute Inpatient Rehabilitation Program and will admit today.   Preadmission Screen  Completed By: Genella Mech with day of admission updates by  Bethel Born, 02/11/2021 10:11 AM ______________________________________________________________________   Discussed status with Dr. Ranell Patrick on 02/11/21  at 10:11 AM and received approval for admission today.   Admission Coordinator: Genella Mech, CCC-SLP with day of admission updates by  Bethel Born, CCC-SLP, time 10:11 AM Sudie Grumbling 02/11/21     Assessment/Plan: Diagnosis: Debillity 1. Does the need for close, 24 hr/day Medical supervision in concert with the patient's rehab needs make it unreasonable for this patient to be served in a less intensive setting? Yes 2. Co-Morbidities requiring supervision/potential complications:  1. Acute renal failure 2. Obesity (BMI 40.57) 3. Tobacco user 4. Diverticulitis 5. Murmur 3. Due to bladder management, bowel management, safety, skin/wound care, disease management, medication administration, pain management and patient education, does the patient require 24 hr/day rehab nursing? Yes 4. Does the patient require coordinated care of a  physician, rehab nurse, PT, OT, and SLP to address physical and functional deficits in the context of the above medical diagnosis(es)? Yes Addressing deficits in the following areas: balance, endurance, locomotion, strength, transferring, bowel/bladder control, bathing, dressing, feeding, grooming, toileting and psychosocial support 5. Can the patient actively participate in an intensive therapy program of at least 3 hrs of therapy 5 days a week? Yes 6. The potential for patient to make measurable gains while on inpatient rehab is excellent 7. Anticipated functional outcomes upon discharge from inpatient rehab: modified independent PT, modified independent OT, independent SLP 8. Estimated rehab length of stay to reach the above functional goals is: 7-8 days 9. Anticipated discharge destination: Home 10. Overall Rehab/Functional Prognosis: excellent     MD Signature: Leeroy Cha, MD

## 2021-02-11 NOTE — Progress Notes (Signed)
Inpatient Rehabilitation Medication Review by a Pharmacist  A complete drug regimen review was completed for this patient to identify any potential clinically significant medication issues.  Clinically significant medication issues were identified: Yes   Type of Medication Issue Identified Description of Issue Urgent (address now) Non-Urgent (address on AM team rounds) Plan Plan Accepted by Provider? (Yes / No / Pending AM Rounds)  Drug Interaction(s) (clinically significant)       Duplicate Therapy       Allergy       No Medication Administration End Date       Incorrect Dose       Additional Drug Therapy Needed       Other  Discharge summary/med list from [facility] indicated that the dose of metoprolol was to be increased to 25 mg PO BID (dose was not increased while inpt at [facility]). Per Rehab provider note, plan to monitor BP and adjust as needed at CIR.  Terbinafine tablets were listed on discharge med list from [facility] (this is a home med that pt did not receive at [facility]). Non urgent Pending review by CIR team on AM rounds Pending CIR team review    Name of provider notified for urgent issues identified:  N/A  For non-urgent medication issues to be resolved on team rounds tomorrow morning a [facility] Secure Chat Handoff was sent to:  N/A (pt transferred to CIR after 10 PM tonight)  Time spent performing this drug regimen review (minutes):  15  Vicki Mallet, PharmD, BCPS, [facility] Clinical Pharmacist 02/11/2021 10:28 PM

## 2021-02-12 ENCOUNTER — Other Ambulatory Visit: Payer: Self-pay

## 2021-02-12 LAB — CK: Total CK: 10299 U/L — ABNORMAL HIGH (ref 49–397)

## 2021-02-12 MED ORDER — LIDOCAINE 5 % EX PTCH
2.0000 | MEDICATED_PATCH | CUTANEOUS | Status: DC
  Administered 2021-02-12: 2 via TRANSDERMAL
  Filled 2021-02-12: qty 2

## 2021-02-12 NOTE — H&P (Signed)
Physical Medicine and Rehabilitation Admission H&P  CC: Debility  HPI: Shane Guzman is a 45 year old right-handed male with history of hypertension as well as OSA on CPAP, tobacco/alcohol use, sigmoid diverticulitis with perforation 2012 and marginal medical compliance..  Per chart review patient lives alone.  Independent prior to admission.  Works as a Estate agent.  1 level home multiple steps to entry.  He does have a close friend who can assist as needed.  Presented 02/04/2021 after being found down, he was diaphoretic and foaming at the mouth.  EMS was activated he had a pulse with agonal respiratory pattern and hypoxemia.  He did receive Narcan.  Patient was hypotensive with IV fluids initiated.  Patient did not require intubation.  Cranial and cervical CT scan.  Chest x-ray showed possible aspiration pneumonia.  Negative CT of abdomen pelvis showed mild bilateral posterior basilar subsegmental atelectasis and/or infiltrates.  Focal wall thickening seen involving proximal sigmoid colon with minimal surrounding inflammatory changes.  It was uncertain if this represented acute diverticulitis or sequela of previous such inflammation.  Admission chemistries potassium 6.3, creatinine 3.22, CK greater than 50,000, lipase 30, amylase 356, troponin I 63-1 96, ammonia level 42, alcohol level 84, urine culture no growth, urine drug screen negative, blood cultures no growth to date, BNP 40.2.  WBC 15,000, elevated LFTs.  Hemoglobin 17.  Echocardiogram with ejection fraction of 60 to 65% grade 1 diastolic dysfunction.  Patient's elevated troponin felt to be related to demand ischemia.  Nephrology services consulted for AKI as well as rhabdo/hyperkalemia.  Renal ultrasound negative for hydronephrosis.  Patient responded well to IV fluids latest creatinine 1.19 and latest CK 13,468.  LFTs have been elevated suspect secondary to rhabdo continue to improve.  Hospital course orthopedic services consulted  02/06/2021 for left foot drop suspect left peroneal nerve compression MR lumbar spine showed right side paraspinous myositis advised AFO patient to follow-up nerve conduction studies outpatient Dr. Horald Chestnut.  Maintained on subcutaneous heparin for DVT prophylaxis.  Patient is completing course of Augmentin for question aspiration pneumonia.  He is tolerating a regular diet.  Physical Medicine & Rehabilitation was consulted to assess candidacy for CIR given impaired mobility.  Review of Systems  Constitutional: Negative for chills and fever.  HENT: Negative for hearing loss.   Eyes: Negative for blurred vision and double vision.  Respiratory: Positive for shortness of breath. Negative for cough.   Cardiovascular: Negative for chest pain, palpitations and leg swelling.  Gastrointestinal: Positive for nausea.  Genitourinary: Negative for dysuria, flank pain and hematuria.  Musculoskeletal: Positive for joint pain and myalgias.  Skin: Negative for rash.  Neurological: Positive for weakness. Negative for seizures.  All other systems reviewed and are negative.  Past Medical History:  Diagnosis Date  . Diverticulitis of intestine with abscess   . Heart murmur   . Hypertension   . OSA on CPAP    Past Surgical History:  Procedure Laterality Date  . ACHILLES TENDON REPAIR  06/2010   left; S/P tendon rupture   Family History  Problem Relation Age of Onset  . Diverticulitis Maternal Grandmother    Social History:  reports that he has been smoking cigarettes. He has been smoking about 0.00 packs per day for the past 18.00 years. He has never used smokeless tobacco. He reports current alcohol use. He reports that he does not use drugs. Allergies:  Allergies  Allergen Reactions  . Lisinopril Swelling   Medications Prior to Admission  Medication Sig  Dispense Refill  . amoxicillin-clavulanate (AUGMENTIN) 875-125 MG tablet Take 1 tablet by mouth every 12 (twelve) hours for 2 days. 4 tablet 0  .  methocarbamol (ROBAXIN) 750 MG tablet Take 1 tablet (750 mg total) by mouth every 6 (six) hours as needed for muscle spasms.    . metoprolol tartrate (LOPRESSOR) 25 MG tablet Take 1 tablet (25 mg total) by mouth 2 (two) times daily.    Marland Kitchen oxyCODONE (OXY IR/ROXICODONE) 5 MG immediate release tablet Take 1 tablet (5 mg total) by mouth every 4 (four) hours as needed for severe pain. 30 tablet 0  . polyethylene glycol (MIRALAX / GLYCOLAX) 17 g packet Take 17 g by mouth daily. 14 each 0  . senna-docusate (SENOKOT-S) 8.6-50 MG tablet Take 1 tablet by mouth 2 (two) times daily.    . simethicone (MYLICON) 80 MG chewable tablet Chew 1 tablet (80 mg total) by mouth 4 (four) times daily as needed for flatulence. 30 tablet 0  . terbinafine (LAMISIL) 250 MG tablet Take 1 tablet (250 mg total) by mouth daily. 90 tablet 0  . traZODone (DESYREL) 50 MG tablet Take 1 tablet (50 mg total) by mouth at bedtime as needed for sleep.     Drug Regimen Review Drug regimen was reviewed and remains appropriate with no significant issues identified  Home: Home Living Family/patient expects to be discharged to:: Private residence Living Arrangements: Alone Available Help at Discharge: Family,Friend(s),Available 24 hours/day (per friend [name]) Type of Home: House Home Access: Stairs to enter [employer] of Steps: 11 Entrance Stairs-Rails: Left Home Layout: One level Bathroom Shower/Tub: Engineer, manufacturing systems: Standard Bathroom Accessibility: Yes Home Equipment: None      Functional History: Prior Function Level of Independence: Independent Comments: works as a Hospital doctor Status:  Mobility: Bed Mobility Overal bed mobility: Needs Assistance Bed Mobility: Rolling,Sit to Sidelying Rolling: Supervision Sidelying to sit: Min guard,HOB elevated Supine to sit: Mod assist,HOB elevated Sit to sidelying: Min assist General bed mobility comments: cues for log rolling for  back/abdominal comfort, heavy use of bed features/rail, minA for BLE Transfers Overall transfer level: Needs assistance Equipment used: Rolling walker (2 wheeled) Transfers: Sit to/from Stand Sit to Stand: +2 safety/equipment,Min guard  Lateral/Scoot Transfers: Mod assist,+2 safety/equipment (2nd person to hold chair in place) General transfer comment: from chair/EOB, pt impulsive to stand and heavy BUE reliance on RW, cues for safety Ambulation/Gait Ambulation/Gait assistance: Min assist Gait Distance (Feet): 30 Feet (87ft, seated break, 20ft) Assistive device: Rolling walker (2 wheeled) Gait Pattern/deviations: Step-to pattern,Decreased stance time - left,Decreased dorsiflexion - left,Decreased weight shift to left,Antalgic,Wide base of support General Gait Details: close chair follow, fair use of RW and able to place L forefoot on floor but still has antalgic gait due to moderate LLE pain; no LOB but fatigues quickly Gait velocity: slowed Gait velocity interpretation: <1.31 ft/sec, indicative of household ambulator  ADL: ADL Overall ADL's : Needs assistance/impaired Eating/Feeding: Independent,Bed level Grooming: Set up,Bed level Upper Body Bathing: Set up,Bed level Lower Body Bathing: Total assistance,Bed level Upper Body Dressing : Minimal assistance,Bed level Lower Body Dressing: Total assistance,Bed level  Cognition: Cognition Overall Cognitive Status: Impaired/Different from baseline Orientation Level: Oriented X4 Cognition Arousal/Alertness: Awake/alert Behavior During Therapy: WFL for tasks assessed/performed,Impulsive Overall Cognitive Status: Impaired/Different from baseline Area of Impairment: Memory,Safety/judgement,Problem solving Memory: Decreased short-term memory Safety/Judgement: Decreased awareness of safety,Decreased awareness of deficits Problem Solving: Requires verbal cues General Comments: somewhat impulsive to get up, cues for safety and pacing,  pulling up on RW with poor carryover of cues to push from bed/chair  Physical Exam: Blood pressure (!) 142/96, pulse 86, temperature 98.3 F (36.8 C), temperature source Oral, resp. rate 16, height 5\' 5"  (1.651 m), weight 114.1 kg, SpO2 97 %. Physical Exam Gen: no distress, normal appearing, friend is at bedside HEENT: oral mucosa pink and moist, NCAT Cardio: Reg rate Chest: normal effort, normal rate of breathing Abd: soft, non-distended Ext: LLE with 1+ edema Psych: pleasant, flat affect Skin: intact Neurological:  Patient is alert.  No acute distress.   Oriented x3 and follows commands.  Upper extremities 5/5 bilaterally LLE 3/5 throughout, limited by pain and edema. 0/5 DF and PF   Results for orders placed or performed during the hospital encounter of 02/11/21 (from the past 48 hour(s))  CK     Status: Abnormal   Collection Time: 02/12/21  4:56 AM  Result Value Ref Range   Total CK 10,299 (H) 49 - 397 U/L    Comment: RESULTS CONFIRMED BY MANUAL DILUTION Performed at [facility], [address] [place], [place]    No results found.     Medical Problem List and Plan: 1.   Debility secondary to AKI from combination of volume contraction/pigment nephropathy/acute metabolic encephalopathy from rhabdo.  Cranial CT scan negative  -patient may shower  -ELOS/Goals: modI in 7-8 days 2.  Antithrombotics: -DVT/anticoagulation: Subcutaneous heparin  -antiplatelet therapy: N/A 3. Pain Management: Robaxin and oxycodone as needed 4. Mood: Provide emotional support  -antipsychotic agents: N/A 5. Neuropsych: This patient is capable of making decisions on his own behalf. 6. Skin/Wound Care: Routine skin checks 7. Fluids/Electrolytes/Nutrition: Routine in and outs with follow-up chemistries 8.  Left foot drop/left peroneal nerve compression.  Follow-up outpatient nerve conduction study. Will benefit from an AFO- will order 9.  Leukocytosis question aspiration.   Continue Augmentin x2 more days and stop. 10.  History of alcohol tobacco use.  Provide counseling 11.  Abnormal LFTs/transaminitis due to rhabdo.  Follow-up liver function studies 12.  Hypertension.  Lopressor 12.5 mg twice daily.  Monitor with increased mobility 13.  AKI from combination of volume contraction/rhabdo.  Continue IV fluids x24 more hours.  Cr currently 1.19- resolved, continue to monitor.  Nephrology follow-up 14.  Morbid obesity.  BMI 40.57.  Dietary follow-up 15.  Constipation.  MiraLAX daily, Senokot twice daily.  Colace twice daily as needed 16.  OSA.  Continue CPAP 17.  History of sigmoid diverticulitis perforation 2012.  Follow-up outpatient 18. Rhabdomyolysis: CK is trending downward, continue to monitor,   2013, PA-C 02/11/2021  I have personally performed a face to face diagnostic evaluation, including, but not limited to relevant history and physical exam findings, of this patient and developed relevant assessment and plan.  Additionally, I have reviewed and concur with the physician assistant's documentation above.  02/13/2021, MD

## 2021-02-12 NOTE — Evaluation (Addendum)
Occupational Therapy Assessment and Plan  Patient Details  Name: Shane Guzman MRN: [id number] Date of Birth: 08/12/1976  OT Diagnosis: abnormal posture, acute pain and muscle weakness (generalized) Rehab Potential: Rehab Potential (ACUTE ONLY): Excellent ELOS: 10-12 days   Today's Date: 02/12/2021 OT Individual Time: 0905-  OT Individual Time Calculation (min): 59 min     Hospital Problem: Principal Problem:   Debility   Past Medical History:  Past Medical History:  Diagnosis Date  . Diverticulitis of intestine with abscess   . Heart murmur   . Hypertension   . OSA on CPAP    Past Surgical History:  Past Surgical History:  Procedure Laterality Date  . ACHILLES TENDON REPAIR  06/2010   left; S/P tendon rupture    Assessment & Plan Clinical Impression: Patient is a 45 y.o. year old male with recent admission to the hospital on 02/04/2021 after being found down, he was diaphoretic and foaming at the mouth.  EMS was activated he had a pulse with agonal respiratory pattern and hypoxemia.  He did receive Narcan.  Patient was hypotensive with IV fluids initiated.  Patient did not require intubation.  Cranial and cervical CT scan.  Chest x-ray showed possible aspiration pneumonia.  Negative CT of abdomen pelvis showed mild bilateral posterior basilar subsegmental atelectasis and/or infiltrates.  Focal wall thickening seen involving proximal sigmoid colon with minimal surrounding inflammatory changes.  It was uncertain if this represented acute diverticulitis or sequela of previous such inflammation.  Admission chemistries potassium 6.3, creatinine 3.22, CK greater than 50,000, lipase 30, amylase 356, troponin I 63-1 96, ammonia level 42, alcohol level 84, urine culture no growth, urine drug screen negative, blood cultures no growth to date, BNP 40.2.  WBC 15,000, elevated LFTs.  Hemoglobin 17.  Echocardiogram with ejection fraction of 60 to 99% grade 1 diastolic dysfunction.  Patient's  elevated troponin felt to be related to demand ischemia.  Nephrology services consulted for AKI as well as rhabdo/hyperkalemia.  Renal ultrasound negative for hydronephrosis.  Patient responded well to IV fluids latest creatinine 1.19 and latest CK 13,468.  LFTs have been elevated suspect secondary to rhabdo continue to improve.  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 Herma Mering.  .  Patient transferred to CIR on 02/11/2021 .    Patient currently requires mod with basic self-care skills secondary to muscle weakness and muscle paralysis, impaired timing and sequencing and decreased standing balance, decreased postural control and decreased balance strategies.  Prior to hospitalization, patient could complete ADLs and IADLs with independent .  Patient will benefit from skilled intervention to decrease level of assist with basic self-care skills, increase independence with basic self-care skills and increase level of independence with iADL prior to discharge home independently.  Anticipate patient will require intermittent supervision and follow up home health.  OT - End of Session Activity Tolerance: Improving Endurance Deficit: Yes OT Assessment Rehab Potential (ACUTE ONLY): Excellent OT Patient demonstrates impairments in the following area(s): Balance;Endurance;Motor;Pain OT Basic ADL's Functional Problem(s): Grooming;Bathing;Dressing;Toileting OT Advanced ADL's Functional Problem(s): Simple Meal Preparation;Laundry OT Transfers Functional Problem(s): Toilet;Tub/Shower OT Additional Impairment(s): None OT Plan OT Intensity: Minimum of 1-2 x/day, 45 to 90 minutes OT Frequency: 5 out of 7 days OT Duration/Estimated Length of Stay: 10-12 days OT Treatment/Interventions: Balance/vestibular  training;Discharge planning;Therapeutic Activities;Self Care/advanced ADL retraining;Disease mangement/prevention;Functional mobility training;Patient/family education;Therapeutic Exercise;DME/adaptive  equipment instruction;Neuromuscular re-education;Psychosocial support;UE/LE Strength taining/ROM OT Self Feeding Anticipated Outcome(s): independent OT Basic Self-Care Anticipated Outcome(s): modified independent OT Toileting Anticipated Outcome(s): modified independent OT Bathroom Transfers Anticipated Outcome(s): modified independent   OT Evaluation Precautions/Restrictions  Precautions Precautions: Fall;Other (comment) Precaution Comments: LLE edema/numbness/foot drop, back pain Restrictions Weight Bearing Restrictions: No  PT Missed Treatment Reason: Patient fatigue;Patient unwilling to participate Vital Signs Therapy Vitals Temp: 98.2 F (36.8 C) Temp Source: Oral Pulse Rate: 84 Resp: 18 BP: (!) 151/85 Patient Position (if appropriate): Lying Oxygen Therapy SpO2: 96 % O2 Device: Room Air Pain Pain Assessment Pain Scale: 0-10 Pain Score: 10-Worst pain ever Pain Type: Chronic pain Pain Location: Back Pain Orientation: Lower Pain Intervention(s): Medication (See eMAR) Home Living/Prior Functioning Home Living Family/patient expects to be discharged to:: Private residence Living Arrangements: Alone Available Help at Discharge: Friend(s),Available PRN/intermittently Type of Home: Apartment Home Access: Stairs to enter Technical brewer of Steps: 12-13 Entrance Stairs-Rails: Right,Left,Can reach both Home Layout: One level Bathroom Shower/Tub: Chiropodist: Standard Bathroom Accessibility: Yes  Lives With: Alone IADL History Homemaking Responsibilities: Yes Meal Prep Responsibility: Primary Current License: Yes Occupation: Full time employment Type of Occupation: Games developer Prior Function Level of Independence: Independent with  gait,Independent with transfers,Independent with homemaking with ambulation  Able to Take Stairs?: Yes Driving: Yes Vocation: Full time employment Vocation Requirements: works full time as Programmer, systems at [employer] Baseline Vision/History: Wears glasses Wears Glasses: At all times Patient Visual Report: No change from baseline Vision Assessment?: No apparent visual deficits Perception   intact Praxis Praxis: Intact Cognition Orientation Level: Person;Place;Situation Person: Oriented Place: Oriented Situation: Oriented Year: 2022 Month: February Day of Week: Correct Memory: Appears intact Immediate Memory Recall: Sock;Blue;Bed Memory Recall Sock: Not able to recall Memory Recall Blue: Without Cue Memory Recall Bed: Without Cue Sensation Sensation Light Touch: Appears Intact Hot/Cold: Appears Intact Proprioception: Appears Intact Stereognosis: Appears Intact Additional Comments: Sensation intact in BUEs Coordination Gross Motor Movements are Fluid and Coordinated: Yes Fine Motor Movements are Fluid and Coordinated: Yes Coordination and Movement Description: BUE coordination [place] Motor  Motor Motor - Skilled Clinical Observations: generalized deconditioning and L LE weakness  Trunk/Postural Assessment  Cervical Assessment Cervical Assessment: Within Functional Limits Thoracic Assessment Thoracic Assessment: Within Functional Limits Lumbar Assessment Lumbar Assessment: Exceptions to WFL (trunk flexion in standing)  Balance Balance Balance Assessed: Yes Static Sitting Balance Static Sitting - Balance Support: Feet supported Static Sitting - Level of Assistance: 5: Stand by assistance Dynamic Sitting Balance Dynamic Sitting - Balance Support: Feet supported Dynamic Sitting - Level of Assistance: 5: Stand by assistance Static Standing Balance Static Standing - Balance Support: During functional activity;Left upper extremity supported Static Standing - Level of  Assistance: 4: Min assist Dynamic Standing Balance Dynamic Standing - Balance Support: During functional activity Dynamic Standing - Level of Assistance: 3: Mod assist Extremity/Trunk Assessment   LUE Assessment LUE Assessment: Within Functional Limits  Care Tool Care Tool Self Care Eating   Eating Assist Level: Independent    Oral Care    Oral Care Assist Level: Set up assist    Bathing   Body parts bathed by patient: Right arm;Left arm;Chest;Abdomen;Front perineal area;Buttocks;Right upper leg;Left upper leg;Face Body parts bathed by helper: Right lower leg;Left lower leg   Assist Level: Minimal Assistance - Patient > 75%    Upper Body Dressing(including orthotics)   What is the patient wearing?: Pull over shirt   Assist Level: Supervision/Verbal cueing    Lower Body  Dressing (excluding footwear)   What is the patient wearing?: Pants Assist for lower body dressing: Moderate Assistance - Patient 50 - 74%    Putting on/Taking off footwear   What is the patient wearing?: Non-skid slipper socks;Ted hose Assist for footwear: Total Assistance - Patient < 25%       Care Tool Toileting Toileting activity         Care Tool Bed Mobility Roll left and right activity   Roll left and right assist level: Supervision/Verbal cueing    Sit to lying activity   Sit to lying assist level: Contact Guard/Touching assist    Lying to sitting edge of bed activity   Lying to sitting edge of bed assist level: Moderate Assistance - Patient 50 - 74%     Care Tool Transfers Sit to stand transfer   Sit to stand assist level: Moderate Assistance - Patient 50 - 74%    Chair/bed transfer   Chair/bed transfer assist level: Moderate Assistance - Patient 50 - 74%     Toilet transfer   Assist Level: Moderate Assistance - Patient 50 - 74%     Care Tool Cognition Expression of Ideas and Wants Expression of Ideas and Wants: Without difficulty (complex and basic) - expresses complex messages  without difficulty and with speech that is clear and easy to understand   Understanding Verbal and Non-Verbal Content Understanding Verbal and Non-Verbal Content: Understands (complex and basic) - clear comprehension without cues or repetitions   Memory/Recall Ability *first 3 days only Memory/Recall Ability *first 3 days only: Current season;That he or she is in a hospital/hospital unit;Staff names and faces    Refer to Care Plan for Long Term Goals  SHORT TERM GOAL WEEK 1 OT Short Term Goal 1 (Week 1): Pt will complete toilet transfers with supervision using the RW and 3:1. OT Short Term Goal 2 (Week 1): Pt will complete LB bathing with use of AE and supervision. OT Short Term Goal 3 (Week 1): Pt will perform LB dressing with min assist using AE PRN. OT Short Term Goal 4 (Week 1): Pt will complete tub'shower transfer with use of the tub bench and min assist.  Recommendations for other services: None    Skilled Therapeutic Intervention ADL ADL Eating: Independent Where Assessed-Eating: Chair Grooming: Setup Where Assessed-Grooming: Edge of bed Upper Body Bathing: Supervision/safety Where Assessed-Upper Body Bathing: Edge of bed Lower Body Bathing: Moderate assistance Where Assessed-Lower Body Bathing: Edge of bed Upper Body Dressing: Supervision/safety Where Assessed-Upper Body Dressing: Edge of bed Lower Body Dressing: Maximal assistance Where Assessed-Lower Body Dressing: Edge of bed Toileting: Moderate assistance Where Assessed-Toileting: Bedside Commode Toilet Transfer: Moderate assistance Toilet Transfer Method: Stand pivot Toilet Transfer Equipment: Radiographer, therapeutic: Not assessed Social research officer, government: Not assessed Mobility  Bed Mobility Bed Mobility: Supine to Sit;Sit to Supine Supine to Sit: Moderate Assistance - Patient 50-74% Sit to Supine: Contact Guard/Touching assist Transfers Sit to Stand: Moderate Assistance - Patient 50-74% Stand to  Sit: Moderate Assistance - Patient 50-74%   Session 1: Pt completed supine to sit EOB with mod assist secondary to increased left hip and right side back pain.  Noted increased swelling in the left upper leg compared to the right.  Once in sitting the pain improved slightly with pt reporting the need to use the urinal and then sit on the toilet.  Mod assist for standing balance to use the urinal and then mod assist for transfer to the [facility] with mod assist  for BM.  He was able to complete clothing management and hygiene with mod assist in standing.  Worked on bathing and dressing EOB with supervision for UB and mod assist for LB sit to stand.  Decreased ability to reach either foot for washing or for donning gripper socks.  Therapist assisted with TEDs and gripper socks with transition back to supine at supervision for pain relief and rest.  Call button and phone in reach.    Session 2: [phone])  Pt in bed sleeping to start session with a friend present.  He was able to complete supine to sit with mod assist from therapist.  Stand pivot transfer to the wheelchair was then completed at min assist with use of the RW for support.  Therapist provided total assist for removal of gripper socks and for donning his shoes and tying them.  He was then transported out of the room via wheelchair.  He voiced the need to urinate, so had him ambulate from outside of the tub room to the toilet with min assist using the RW for support.  Therapist tied an ace bandage to the LLE to help decrease foot drop.  Min assist for toileting in standing with use of the grab bar on the right side for support.  He washed his hands at the sink with min assist in standing as well and ambulated across the hall to the therapy gym.  After resting, had pt work on standing and completing toe taps on a 4-5" step with the RLE while using the LLE for support as well as the walker.  Min guard for completion of task with the walker.  When asked to let  go of the walker with the LUE to allow for more activity in the left leg, he needed mod assist and demonstrated increased trunk flexion.  Rest breaks were given after standing for 1-2 mins secondary to increased pain in his left upper leg as well as his lower back.  Finished session with functional mobility to the ADL apartment with min assist using the walker.  He completed sit to stand with mod assist from the lower couch with ambulation back to the room.  Increased dyspnea noted with activity but sats at 98% and HR less than 100.  He was left in the recliner with the call button and phone in reach and safety belt in place.     Discharge Criteria: Patient will be discharged from OT if patient refuses treatment 3 consecutive times without medical reason, if treatment goals not met, if there is a change in medical status, if patient makes no progress towards goals or if patient is discharged from hospital.  The above assessment, treatment plan, treatment alternatives and goals were discussed and mutually agreed upon: by patient  Sheron Tallman OTR/L 02/14/2021, 9:43 AM

## 2021-02-12 NOTE — Evaluation (Signed)
Physical Therapy Assessment and Plan  Patient Details  Name: Shane Guzman MRN: [id number] Date of Birth: 09-08-1976  PT Diagnosis: Abnormality of gait, Difficulty walking, Edema, Impaired sensation, Muscle weakness and Pain in L LE and back Rehab Potential: Good ELOS: 1.5-2 weeks   Today's Date: 02/12/2021 PT Individual Time: 1015-1130 PT Individual Time Calculation (min): 75 min    Hospital Problem: Principal Problem:   Debility   Past Medical History:  Past Medical History:  Diagnosis Date  . Diverticulitis of intestine with abscess   . Heart murmur   . Hypertension   . OSA on CPAP    Past Surgical History:  Past Surgical History:  Procedure Laterality Date  . ACHILLES TENDON REPAIR  06/2010   left; S/P tendon rupture    Assessment & Plan Clinical Impression: Patient is a 45 y.o.  right-handed male with history of hypertension as well as OSA on CPAP, tobacco/alcohol use, sigmoid diverticulitis with perforation 2012 and marginal medical compliance..  Per chart review patient lives alone.  Independent prior to admission.  Works as a Freight forwarder.  1 level home multiple steps to entry.  He does have a close friend who can assist as needed.  Presented 02/04/2021 after being found down, he was diaphoretic and foaming at the mouth.  EMS was activated he had a pulse with agonal respiratory pattern and hypoxemia.  He did receive Narcan. Patient was hypotensive with IV fluids initiated.  Patient did not require intubation.  Cranial and cervical CT scan.  Chest x-ray showed possible aspiration pneumonia.  Negative CT of abdomen pelvis showed mild bilateral posterior basilar subsegmental atelectasis and/or infiltrates.  Focal wall thickening seen involving proximal sigmoid colon with minimal surrounding inflammatory changes.  It was uncertain if this represented acute diverticulitis or sequela of previous such inflammation.  Admission chemistries potassium 6.3, creatinine 3.22, CK  greater than 50,000, lipase 30, amylase 356, troponin I 63-1 96, ammonia level 42, alcohol level 84, urine culture no growth, urine drug screen negative, blood cultures no growth to date, BNP 40.2.  WBC 15,000, elevated LFTs.  Hemoglobin 17.  Echocardiogram with ejection fraction of 60 to 76% grade 1 diastolic dysfunction.  Patient's elevated troponin felt to be related to demand ischemia.  Nephrology services consulted for AKI as well as rhabdo/hyperkalemia.  Renal ultrasound negative for hydronephrosis.  Patient responded well to IV fluids latest creatinine 1.19 and latest CK 13,468.  LFTs have been elevated suspect secondary to rhabdo continue to improve.  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 Herma Mering.  Maintained on subcutaneous heparin for DVT prophylaxis.  Patient is completing course of Augmentin for question aspiration pneumonia.  He is tolerating a regular diet.  Physical Medicine & Rehabilitation was consulted to assess candidacy for CIR given impaired mobility.  Patient transferred to CIR on 02/11/2021 .   Patient currently requires min/mod assist with mobility secondary to muscle weakness, muscle joint tightness and muscle paralysis, decreased cardiorespiratoy endurance, impaired timing and sequencing and unbalanced muscle activation and decreased standing balance, decreased postural control and decreased balance strategies.  Prior to hospitalization, patient was independent  with mobility and lived Alone in a Apartment .  Home access is 12-13Stairs to enter.  Patient will benefit from skilled PT intervention to maximize safe functional mobility, minimize fall risk and decrease caregiver burden for planned discharge home  with intermittent assist.  Anticipate patient will benefit from follow up OP at  discharge.  PT - End of Session Activity Tolerance: Tolerates 30+ min activity with multiple rests Endurance Deficit: Yes Endurance Deficit Description: requires seated rest breaks after stairs and gait with labored breathing PT Assessment Rehab Potential (ACUTE/IP ONLY): Good PT Barriers to Discharge: [place] home environment;Decreased caregiver support;Home environment access/layout;Lack of/limited family support PT Patient demonstrates impairments in the following area(s): Balance;Perception;Safety;Edema;Sensory;Endurance;Skin Integrity;Motor;Nutrition;Pain;Behavior PT Transfers Functional Problem(s): Bed Mobility;Bed to Chair;Car;Furniture;Floor PT Locomotion Functional Problem(s): Ambulation;Stairs PT Plan PT Intensity: Minimum of 1-2 x/day ,45 to 90 minutes PT Frequency: 5 out of 7 days PT Duration Estimated Length of Stay: 1.5-2 weeks PT Treatment/Interventions: Ambulation/gait training;Community reintegration;DME/adaptive equipment instruction;Neuromuscular re-education;Psychosocial support;Stair training;UE/LE Strength taining/ROM;Wheelchair propulsion/positioning;Balance/vestibular training;Discharge planning;Functional electrical stimulation;Pain management;Skin care/wound management;Therapeutic Activities;UE/LE Coordination activities;Cognitive remediation/compensation;Disease management/prevention;Functional mobility training;Patient/family education;Splinting/orthotics;Therapeutic Exercise;Visual/perceptual remediation/compensation PT Transfers Anticipated Outcome(s): mod-I PT Locomotion Anticipated Outcome(s): mod-I PT Recommendation Follow Up Recommendations: Outpatient PT Patient destination: Home Equipment Recommended: To be determined   PT Evaluation Precautions/Restrictions Precautions Precautions: Fall;Other (comment) Precaution Comments: LLE edema/numbness/foot drop Restrictions Weight Bearing Restrictions: Yes LLE Weight Bearing: Weight bearing as  tolerated Other Position/Activity Restrictions: "WBAT with functional AFO" per ortho MD note 2/20 Pain Pain Assessment Pain Scale: 0-10 Pain Score: 10-Worst pain ever (prior to moving pain had decreased to 5/10) Pain Type: Acute pain Pain Location: Hip Pain Orientation: Left;Right Pain Descriptors / Indicators: Other (Comment) (states "just hurting") Pain Onset: With Activity Pain Intervention(s): Medication (See eMAR);Rest;Relaxation;Repositioned Home Living/Prior Functioning Home Living Available Help at Discharge: Friend(s);Available PRN/intermittently (one friend that works both part time and full time) Type of Home: Apartment Home Access: Stairs to enter [employer] of Steps: 12-13 Entrance Stairs-Rails: Right;Left;Can reach both Home Layout: One level  Lives With: Alone Prior Function Level of Independence: Independent with gait;Independent with transfers;Independent with homemaking with ambulation  Able to Take Stairs?: Yes Driving: Yes Vocation: Full time employment Vocation Requirements: works full time as Programmer, systems at [employer] Perception: Within Functional Limits Praxis Praxis: Intact  Cognition Overall Cognitive Status: Within Functional Limits for tasks assessed Arousal/Alertness: Lethargic (increased alertness during session) Orientation Level: Oriented X4 Awareness: Appears intact Safety/Judgment: Appears intact Sensation Sensation Light Touch: Impaired Detail Peripheral sensation comments: reports impaired sensation in L foot compared to R with a "tingling" in L foot Light Touch Impaired Details: Impaired LLE Hot/Cold: Not tested Proprioception: Impaired Detail Proprioception Impaired Details: Impaired LLE Stereognosis: Not tested Coordination Gross Motor Movements are Fluid and Coordinated: No Coordination and Movement Description: impaired due to L LE weakness, edema, and pain Motor  Motor Motor: Other  (comment) Motor - Skilled Clinical Observations: generalized deconditioning and L LE weakness   Trunk/Postural Assessment  Cervical Assessment Cervical Assessment: Within Functional Limits Thoracic Assessment Thoracic Assessment: Within Functional Limits Lumbar Assessment Lumbar Assessment: Exceptions to [facility] (in sitting keeps hips shifted off of L side due to pain causing lumbar lateral flexion and pelvic obliquity) Postural Control Postural Control: Deficits on evaluation Postural Limitations: decreased due to limited L weight shift because of pain and weakness  Balance Balance Balance Assessed: Yes Static Sitting Balance Static Sitting - Balance Support: Feet supported Static Sitting - Level of Assistance: 5: Stand by assistance Dynamic Sitting Balance Dynamic Sitting - Balance Support: Feet supported Dynamic Sitting - Level of Assistance: 5: Stand by assistance Static Standing Balance Static Standing - Balance Support: During functional activity;Left upper extremity supported Static Standing - Level of Assistance: 4: Min assist;3:  Mod assist Dynamic Standing Balance Dynamic Standing - Balance Support: During functional activity Dynamic Standing - Level of Assistance: 3: Mod assist Extremity Assessment      RLE Assessment RLE Assessment: Within Functional Limits Active Range of Motion (AROM) Comments: WFL General Strength Comments: 5/5 assessed in sitting LLE Assessment LLE Assessment: Exceptions to [facility] Passive Range of Motion (PROM) Comments: WFL LLE Strength Left Hip Flexion: 2+/5 Left Knee Flexion: 2/5 Left Knee Extension: 3+/5 Left Ankle Dorsiflexion: 0/5 Left Ankle Plantar Flexion: 2/5  Care Tool Care Tool Bed Mobility Roll left and right activity   Roll left and right assist level: Supervision/Verbal cueing    Sit to lying activity   Sit to lying assist level: Contact Guard/Touching assist    Lying to sitting edge of bed activity   Lying to sitting edge  of bed assist level: Moderate Assistance - Patient 50 - 74%     Care Tool Transfers Sit to stand transfer   Sit to stand assist level: Moderate Assistance - Patient 50 - 74%    Chair/bed transfer   Chair/bed transfer assist level: Moderate Assistance - Patient 50 - 74%     Physiological scientist transfer assist level: Minimal Assistance - Patient > 75%      Care Tool Locomotion Ambulation   Assist level: Moderate Assistance - Patient 50 - 74% Assistive device: No Device Max distance: 27f  Walk 10 feet activity   Assist level: Moderate Assistance - Patient - 50 - 74% Assistive device: No Device   Walk 50 feet with 2 turns activity Walk 50 feet with 2 turns activity did not occur: Safety/medical concerns      Walk 150 feet activity Walk 150 feet activity did not occur: Safety/medical concerns      Walk 10 feet on uneven surfaces activity Walk 10 feet on uneven surfaces activity did not occur: Safety/medical concerns      Stairs   Assist level: Minimal Assistance - Patient > 75% Stairs assistive device: 2 hand rails Max number of stairs: 8  Walk up/down 1 step activity   Walk up/down 1 step (curb) assist level: Minimal Assistance - Patient > 75% Walk up/down 1 step or curb assistive device: 2 hand rails    Walk up/down 4 steps activity Walk up/down 4 steps assist level: Minimal Assistance - Patient > 75% Walk up/down 4 steps assistive device: 2 hand rails  Walk up/down 12 steps activity Walk up/down 12 steps activity did not occur: Safety/medical concerns      Pick up small objects from floor Pick up small object from the floor (from standing position) activity did not occur: Safety/medical concerns      Wheelchair Will patient use wheelchair at discharge?: No          Wheel 50 feet with 2 turns activity      Wheel 150 feet activity        Refer to Care Plan for Long Term Goals  SHORT TERM GOAL WEEK 1 PT Short Term Goal 1 (Week 1): Pt  wil perform supine<>sit with CGA PT Short Term Goal 2 (Week 1): Pt will perform sit<>stand using LRAD with CGA PT Short Term Goal 3 (Week 1): Pt will perform bed<>chair transfers using LRAD with CGA PT Short Term Goal 4 (Week 1): Pt will ambulate at least 1037fusing LRAD with CGA PT Short Term Goal 5 (Week 1): Pt will ascend/descend 12 steps using  HRs with CGA  Recommendations for other services: None   Skilled Therapeutic Intervention Pt received asleep, supine in bed with lights off, but upon awakening agreeable to therapy session. Evaluation completed (see details above) with patient education regarding purpose of PT evaluation, PT POC and goals, therapy schedule, weekly team meetings, and other CIR information including safety plan and fall risk safety. Pt noted to have 0/5 MMT in L LE ankle DF - educated pt on importance of wearing PRAFO at night and plan for AFO consultation during CIR stay - educated pt on need for larger sized shoe with detached tongue to allow AFO to fit in shoe with pt's L LE edema. Therapist provided pt with wheelchair, cushion, and L LE ELR to promote increased upright, OOB activity tolerance. Pt performed the below mobility tasks with level of assist as specified - therapist providing cuing for technique/sequencing throughout. During gait training pt demonstrates primarily step-to pattern leading with L LE and excessive L ankle PF and knee extension upon initial contact due to lack of active ankle DF strength, decreased L LE stance time with pt compensating via UE support on either therapist or RW to off load that LE (pt states doing this primarily due to feeling weak as opposed to increased pain). Ascended/descended stairs using B HRs with pt initially stepping up leading with L LE during step-to pattern but upon question cuing able to problem solve to lead with R LE on ascent and L LE on descent. At end of session pt left sitting in recliner with needs in reach and seat belt  alarm on.  Mobility  Bed Mobility Bed Mobility: Supine to Sit;Sit to Supine Supine to Sit: Moderate Assistance - Patient 50-74% Sit to Supine: Contact Guard/Touching assist Transfers Transfers: Sit to Stand;Stand to Sit;Stand Pivot Transfers Sit to Stand: Moderate Assistance - Patient 50-74% Stand to Sit: Moderate Assistance - Patient 50-74% Stand Pivot Transfers: Moderate Assistance - Patient 50 - 74% Stand Pivot Transfer Details: Verbal cues for technique;Verbal cues for sequencing;Tactile cues for sequencing;Verbal cues for precautions/safety;Tactile cues for placement;Tactile cues for weight shifting;Tactile cues for initiation;Tactile cues for posture Transfer (Assistive device): None Locomotion  Gait Ambulation: Yes Gait Assistance: Moderate Assistance - Patient 50-74% Gait Distance (Feet): 33 Feet Assistive device: 1 person hand held assist;Other (Comment) (L UE support over therapist's shoulder; performed 2nd walk 32f using RW with min assist continuing with below gait deviations) Gait Assistance Details: Verbal cues for gait pattern;Verbal cues for technique;Verbal cues for precautions/safety;Verbal cues for sequencing;Tactile cues for sequencing;Tactile cues for weight shifting;Tactile cues for posture Gait Gait: Yes Gait Pattern: Impaired Gait Pattern: Step-to pattern;Decreased step length - right;Decreased stride length;Decreased stance time - left;Decreased hip/knee flexion - left;Wide base of support Stairs / Additional Locomotion Stairs: Yes Stairs Assistance: Minimal Assistance - Patient > 75% Stair Management Technique: Two rails Number of Stairs: 8 Height of Stairs: 6 Wheelchair Mobility Wheelchair Mobility: No   Discharge Criteria: Patient will be discharged from PT if patient refuses treatment 3 consecutive times without medical reason, if treatment goals not met, if there is a change in medical status, if patient makes no progress towards goals or if patient  is discharged from hospital.  The above assessment, treatment plan, treatment alternatives and goals were discussed and mutually agreed upon: by patient  CTawana Scale, PT, DPT, CSRS  02/12/2021, 7:55 AM

## 2021-02-12 NOTE — Plan of Care (Signed)
  Problem: Education: Goal: Knowledge of General Education information will improve Description: Including pain rating scale, medication(s)/side effects and non-pharmacologic comfort measures Outcome: Progressing   Problem: Elimination: Goal: Will not experience complications related to bowel motility Outcome: Progressing   Problem: Pain Managment: Goal: General experience of comfort will improve Description: Will have pain equal to or less than 3 on a scale of 10. Outcome: Progressing   Problem: Safety: Goal: Ability to remain free from injury will improve Description: Patient will remain free of falls. Outcome: Progressing   

## 2021-02-12 NOTE — Progress Notes (Signed)
Patient arrived at about 2200 by bed via previous unit nurse and nurse tech.

## 2021-02-12 NOTE — Plan of Care (Signed)
  Problem: Education: Goal: Knowledge of General Education information will improve Description: Including pain rating scale, medication(s)/side effects and non-pharmacologic comfort measures Outcome: Progressing   Problem: Elimination: Goal: Will not experience complications related to bowel motility Outcome: Progressing   Problem: Pain Managment: Goal: General experience of comfort will improve Description: Will have pain equal to or less than 3 on a scale of 10. Outcome: Progressing   Problem: Safety: Goal: Ability to remain free from injury will improve Description: Patient will remain free of falls. Outcome: Progressing

## 2021-02-12 NOTE — Progress Notes (Signed)
Orthopedic Tech Progress Note Patient Details:  Shane Guzman [birth year] [id number] Ordered brace Patient ID: [name], male   DOB: [birth year], 45 y.o.   MRN: [id number]   Michelle Piper 02/12/2021, 8:54 AM

## 2021-02-12 NOTE — Progress Notes (Signed)
Patient refused CPAP tonight 

## 2021-02-13 DIAGNOSIS — R5381 Other malaise: Secondary | ICD-10-CM | POA: Diagnosis not present

## 2021-02-13 LAB — CK: Total CK: 6082 U/L — ABNORMAL HIGH (ref 49–397)

## 2021-02-13 MED ORDER — GABAPENTIN 300 MG PO CAPS
300.0000 mg | ORAL_CAPSULE | Freq: Three times a day (TID) | ORAL | Status: DC
  Administered 2021-02-13 – 2021-02-25 (×36): 300 mg via ORAL
  Filled 2021-02-13 (×36): qty 1

## 2021-02-13 MED ORDER — OXYCODONE HCL 5 MG PO TABS
5.0000 mg | ORAL_TABLET | ORAL | Status: DC | PRN
  Administered 2021-02-13 – 2021-02-17 (×19): 10 mg via ORAL
  Administered 2021-02-18: 5 mg via ORAL
  Administered 2021-02-18: 10 mg via ORAL
  Administered 2021-02-18: 5 mg via ORAL
  Administered 2021-02-18 – 2021-02-20 (×6): 10 mg via ORAL
  Administered 2021-02-21: 5 mg via ORAL
  Administered 2021-02-21 (×2): 10 mg via ORAL
  Administered 2021-02-21: 5 mg via ORAL
  Administered 2021-02-21 – 2021-02-25 (×12): 10 mg via ORAL
  Administered 2021-02-25 (×2): 5 mg via ORAL
  Administered 2021-02-25 – 2021-02-28 (×11): 10 mg via ORAL
  Filled 2021-02-13 (×33): qty 2
  Filled 2021-02-13: qty 1
  Filled 2021-02-13 (×21): qty 2
  Filled 2021-02-13: qty 1
  Filled 2021-02-13 (×3): qty 2

## 2021-02-13 NOTE — Progress Notes (Signed)
Patient refused CPAP tonight 

## 2021-02-13 NOTE — Progress Notes (Signed)
PROGRESS NOTE   Subjective/Complaints:  Pt reports having LLE swelling, on side of L foot drop- doesn't understand why and wants it "fixed".  Also having burning and stabbing pain in LLE from nerve pain.  Lidoderm wasn't helpful for back pain- wants it stopped.  Can't get comfortable no matter what.   ROS:  Pt denies SOB, abd pain, CP, N/V/C/D, and vision changes  Objective:   No results found. Recent Labs    02/11/21 0057  WBC 13.7*  HGB 12.8*  HCT 37.7*  PLT 204   Recent Labs    02/11/21 0057  NA 136  K 3.7  CL 99  CO2 27  GLUCOSE 104*  BUN 14  CREATININE 1.19  CALCIUM 8.4*    Intake/Output Summary (Last 24 hours) at 02/13/2021 1033 Last data filed at 02/13/2021 0730 Gross per 24 hour  Intake 1268 ml  Output 1600 ml  Net -332 ml        Physical Exam: Vital Signs Blood pressure (!) 145/89, pulse 88, temperature 98.1 F (36.7 C), temperature source Oral, resp. rate 20, height 5\' 5"  (1.651 m), weight 114.1 kg, SpO2 96 %.   Physical Exam Gen: awake, tossing and turning in bed- supine overall, NAD HEENT: oral mucosa pink and moist, NCAT Cardio: RRR- no JVD Chest: CTA B/L- no W/R/R- good air movement Abd: Soft, NT, ND, (+)BS  Ext: LLE 1+ edema to lower calf Psych: appropriate, irritable about pain Skin: intact Neurological: Ox3  Upper extremities 5/5 bilaterally LLE 3/5 throughout, limited by pain and edema. 0/5 DF and PF    Assessment/Plan: 1. Functional deficits which require 3+ hours per day of interdisciplinary therapy in a comprehensive inpatient rehab setting.  Physiatrist is providing close team supervision and 24 hour management of active medical problems listed below.  Physiatrist and rehab team continue to assess barriers to discharge/monitor patient progress toward functional and medical goals  Care Tool:  Bathing    Body parts bathed by patient: Right arm,Left  arm,Chest,Abdomen,Front perineal area,Buttocks,Right upper leg,Left upper leg,Face   Body parts bathed by helper: Right lower leg,Left lower leg     Bathing assist Assist Level: Minimal Assistance - Patient > 75%     Upper Body Dressing/Undressing Upper body dressing   What is the patient wearing?: Pull over shirt    Upper body assist Assist Level: Supervision/Verbal cueing    Lower Body Dressing/Undressing Lower body dressing      What is the patient wearing?: Hospital gown only     Lower body assist Assist for lower body dressing: Supervision/Verbal cueing     Toileting Toileting    Toileting assist Assist for toileting: Moderate Assistance - Patient 50 - 74%     Transfers Chair/bed transfer  Transfers assist     Chair/bed transfer assist level: Moderate Assistance - Patient 50 - 74%     Locomotion Ambulation   Ambulation assist      Assist level: Moderate Assistance - Patient 50 - 74% Assistive device: No Device Max distance: 73ft   Walk 10 feet activity   Assist     Assist level: Moderate Assistance - Patient - 50 - 74% Assistive device: No  Device   Walk 50 feet activity   Assist Walk 50 feet with 2 turns activity did not occur: Safety/medical concerns         Walk 150 feet activity   Assist Walk 150 feet activity did not occur: Safety/medical concerns         Walk 10 feet on uneven surface  activity   Assist Walk 10 feet on uneven surfaces activity did not occur: Safety/medical concerns         Wheelchair     Assist Will patient use wheelchair at discharge?: No             Wheelchair 50 feet with 2 turns activity    Assist            Wheelchair 150 feet activity     Assist          Blood pressure (!) 145/89, pulse 88, temperature 98.1 F (36.7 C), temperature source Oral, resp. rate 20, height 5\' 5"  (1.651 m), weight 114.1 kg, SpO2 96 %.  Medical Problem List and Plan: 1.   Debility  secondary to AKI from combination of volume contraction/pigment nephropathy/acute metabolic encephalopathy from rhabdo.  Cranial CT scan negative             -patient may shower             -ELOS/Goals: modI in 7-8 days 2.  Antithrombotics: -DVT/anticoagulation: Subcutaneous heparin             -antiplatelet therapy: N/A 3. Pain Management: Robaxin and oxycodone as needed  2/27- added Gabapentin 300 mg TID for nerve pain and increased oxy to 5-10 mg q4 hours prn; lidoderm wasn't helpful- stopped it for back pain.  4. Mood: Provide emotional support             -antipsychotic agents: N/A 5. Neuropsych: This patient is capable of making decisions on his own behalf. 6. Skin/Wound Care: Routine skin checks 7. Fluids/Electrolytes/Nutrition: Routine in and outs with follow-up chemistries 8.  Left foot drop/left peroneal nerve compression.  Follow-up outpatient nerve conduction study. Will benefit from an AFO- will order 9.  Leukocytosis question aspiration.  Continue Augmentin x2 more days and stop.  2/27- labs in AM s/p Augmentin 10.  History of alcohol tobacco use.  Provide counseling 11.  Abnormal LFTs/transaminitis due to rhabdo.  Follow-up liver function studies  2/27- Last LFTs in 400s- will recheck Monday am to see downtrend, hopefully 12.  Hypertension.  Lopressor 12.5 mg twice daily.  Monitor with increased mobility  2/27- BP slightly elevated in 140s systolic- pulse 80s- if stays up, will increase Lopressor 13.  AKI from combination of volume contraction/rhabdo.  Continue IV fluids x24 more hours.  Cr currently 1.19- resolved, continue to monitor.  Nephrology follow-up  2/27- CK is down to 6000 from 32K on 2/23- max was >50k- - Cr doing well 14.  Morbid obesity.  BMI 40.57.  Dietary follow-up 15.  Constipation.  MiraLAX daily, Senokot twice daily.  Colace twice daily as needed 16.  OSA.  Continue CPAP 17.  History of sigmoid diverticulitis perforation 2012.  Follow-up outpatient 18.  Rhabdomyolysis: CK is trending downward, continue to monitor  2/27- per #13    LOS: 2 days A FACE TO FACE EVALUATION WAS PERFORMED  Masako Overall 02/13/2021, 10:33 AM

## 2021-02-14 DIAGNOSIS — R5381 Other malaise: Secondary | ICD-10-CM | POA: Diagnosis not present

## 2021-02-14 LAB — CBC WITH DIFFERENTIAL/PLATELET
Abs Immature Granulocytes: 0.09 10*3/uL — ABNORMAL HIGH (ref 0.00–0.07)
Basophils Absolute: 0 10*3/uL (ref 0.0–0.1)
Basophils Relative: 0 %
Eosinophils Absolute: 0.3 10*3/uL (ref 0.0–0.5)
Eosinophils Relative: 2 %
HCT: 35.3 % — ABNORMAL LOW (ref 39.0–52.0)
Hemoglobin: 11.9 g/dL — ABNORMAL LOW (ref 13.0–17.0)
Immature Granulocytes: 1 %
Lymphocytes Relative: 14 %
Lymphs Abs: 1.9 10*3/uL (ref 0.7–4.0)
MCH: 33.8 pg (ref 26.0–34.0)
MCHC: 33.7 g/dL (ref 30.0–36.0)
MCV: 100.3 fL — ABNORMAL HIGH (ref 80.0–100.0)
Monocytes Absolute: 0.9 10*3/uL (ref 0.1–1.0)
Monocytes Relative: 7 %
Neutro Abs: 10.7 10*3/uL — ABNORMAL HIGH (ref 1.7–7.7)
Neutrophils Relative %: 76 %
Platelets: 290 10*3/uL (ref 150–400)
RBC: 3.52 MIL/uL — ABNORMAL LOW (ref 4.22–5.81)
RDW: 11.7 % (ref 11.5–15.5)
WBC: 13.9 10*3/uL — ABNORMAL HIGH (ref 4.0–10.5)
nRBC: 0 % (ref 0.0–0.2)

## 2021-02-14 LAB — COMPREHENSIVE METABOLIC PANEL
ALT: 115 U/L — ABNORMAL HIGH (ref 0–44)
AST: 119 U/L — ABNORMAL HIGH (ref 15–41)
Albumin: 2.5 g/dL — ABNORMAL LOW (ref 3.5–5.0)
Alkaline Phosphatase: 49 U/L (ref 38–126)
Anion gap: 10 (ref 5–15)
BUN: 14 mg/dL (ref 6–20)
CO2: 24 mmol/L (ref 22–32)
Calcium: 8.6 mg/dL — ABNORMAL LOW (ref 8.9–10.3)
Chloride: 102 mmol/L (ref 98–111)
Creatinine, Ser: 1.28 mg/dL — ABNORMAL HIGH (ref 0.61–1.24)
GFR, Estimated: >60 mL/min (ref >60)
Glucose, Bld: 134 mg/dL — ABNORMAL HIGH (ref 70–99)
Potassium: 3.8 mmol/L (ref 3.5–5.1)
Sodium: 136 mmol/L (ref 135–145)
Total Bilirubin: 0.8 mg/dL (ref 0.3–1.2)
Total Protein: 5.6 g/dL — ABNORMAL LOW (ref 6.5–8.1)

## 2021-02-14 LAB — CK: Total CK: 3793 U/L — ABNORMAL HIGH (ref 49–397)

## 2021-02-14 NOTE — Progress Notes (Signed)
Inpatient Rehabilitation  Patient information reviewed and entered into eRehab system by Ellery Meroney M. Justina Bertini, M.A., CCC/SLP, PPS Coordinator.  Information including medical coding, functional ability and quality indicators will be reviewed and updated through discharge.    

## 2021-02-14 NOTE — Progress Notes (Signed)
PROGRESS NOTE   Subjective/Complaints: No complaints this morning Quite groggy, but arousable  ROS:  Pt denies SOB, abd pain, CP, N/V/C/D, and vision changes  Objective:   No results found. Recent Labs    02/14/21 0519  WBC 13.9*  HGB 11.9*  HCT 35.3*  PLT 290   Recent Labs    02/14/21 0519  NA 136  K 3.8  CL 102  CO2 24  GLUCOSE 134*  BUN 14  CREATININE 1.28*  CALCIUM 8.6*    Intake/Output Summary (Last 24 hours) at 02/14/2021 1316 Last data filed at 02/14/2021 0600 Gross per 24 hour  Intake 1827 ml  Output 1775 ml  Net 52 ml        Physical Exam: Vital Signs Blood pressure 140/84, pulse 87, temperature 98.7 F (37.1 C), temperature source Oral, resp. rate 20, height 5\' 5"  (1.651 m), weight 117 kg, SpO2 94 %.   Physical Exam Gen: no distress, normal appearing HEENT: oral mucosa pink and moist, NCAT Cardio: Reg rate Chest: normal effort, normal rate of breathing Abd: soft, non-distended Psych: pleasant, normal affect Skin: intact Neuro: Ext: LLE 1+ edema to lower calf Psych: appropriate, irritable about pain Skin: intact Neurological: Ox3  Upper extremities 5/5 bilaterally LLE 3/5 throughout, limited by pain and edema. 0/5 DF and PF    Assessment/Plan: 1. Functional deficits which require 3+ hours per day of interdisciplinary therapy in a comprehensive inpatient rehab setting.  Physiatrist is providing close team supervision and 24 hour management of active medical problems listed below.  Physiatrist and rehab team continue to assess barriers to discharge/monitor patient progress toward functional and medical goals  Care Tool:  Bathing    Body parts bathed by patient: Right arm,Left arm,Chest,Abdomen,Front perineal area,Buttocks,Right upper leg,Left upper leg,Face   Body parts bathed by helper: Right lower leg,Left lower leg     Bathing assist Assist Level: Minimal Assistance -  Patient > 75%     Upper Body Dressing/Undressing Upper body dressing   What is the patient wearing?: Pull over shirt    Upper body assist Assist Level: Set up assist    Lower Body Dressing/Undressing Lower body dressing      What is the patient wearing?: Underwear/pull up,Pants     Lower body assist Assist for lower body dressing: Moderate Assistance - Patient 50 - 74%     Toileting Toileting    Toileting assist Assist for toileting: Moderate Assistance - Patient 50 - 74%     Transfers Chair/bed transfer  Transfers assist     Chair/bed transfer assist level: Moderate Assistance - Patient 50 - 74%     Locomotion Ambulation   Ambulation assist      Assist level: Moderate Assistance - Patient 50 - 74% Assistive device: No Device Max distance: 25ft   Walk 10 feet activity   Assist     Assist level: Moderate Assistance - Patient - 50 - 74% Assistive device: No Device   Walk 50 feet activity   Assist Walk 50 feet with 2 turns activity did not occur: Safety/medical concerns         Walk 150 feet activity   Assist Walk 150 feet  activity did not occur: Safety/medical concerns         Walk 10 feet on uneven surface  activity   Assist Walk 10 feet on uneven surfaces activity did not occur: Safety/medical concerns         Wheelchair     Assist Will patient use wheelchair at discharge?: No             Wheelchair 50 feet with 2 turns activity    Assist            Wheelchair 150 feet activity     Assist          Blood pressure 140/84, pulse 87, temperature 98.7 F (37.1 C), temperature source Oral, resp. rate 20, height 5\' 5"  (1.651 m), weight 117 kg, SpO2 94 %.  Medical Problem List and Plan: 1.   Debility secondary to AKI from combination of volume contraction/pigment nephropathy/acute metabolic encephalopathy from rhabdo.  Cranial CT scan negative             -patient may shower             -ELOS/Goals:  modI in 7-8 days  -Continue CIR 2.  Antithrombotics: -DVT/anticoagulation: Continue Subcutaneous heparin             -antiplatelet therapy: N/A 3. Pain Management: Robaxin and oxycodone as needed  2/27- added Gabapentin 300 mg TID for nerve pain and increased oxy to 5-10 mg q4 hours prn; lidoderm wasn't helpful- stopped it for back pain.  4. Mood: Provide emotional support             -antipsychotic agents: N/A 5. Neuropsych: This patient is capable of making decisions on his own behalf. 6. Skin/Wound Care: Routine skin checks 7. Fluids/Electrolytes/Nutrition: Routine in and outs with follow-up chemistries 8.  Left foot drop/left peroneal nerve compression.  Follow-up outpatient nerve conduction study. Will benefit from an AFO- ordered 9.  Leukocytosis question aspiration.  Continue Augmentin x2 more days and stop.  2/27- labs in AM s/p Augmentin  2/28: WBC reviewed- 13.9- stable but trending upward, repeat tomorrow to trend 10.  History of alcohol tobacco use.  Provide counseling 11.  Abnormal LFTs/transaminitis due to rhabdo.  Follow-up liver function studies  2/27- Last LFTs in 400s- will recheck Monday am to see downtrend, hopefully  2/28: LFTS reviewed: trending downward, repeat in 1 week.  12.  Hypertension.  Lopressor 12.5 mg twice daily.  Monitor with increased mobility  2/27- BP slightly elevated in 140s systolic- pulse 80s- if stays up, will increase Lopressor 13.  AKI from combination of volume contraction/rhabdo.  Continue IV fluids x24 more hours.  Cr currently 1.19- resolved, continue to monitor.  Nephrology follow-up  2/27- CK is down to 6000 from 32K on 2/23- max was >50k- - Cr doing well 14.  Morbid obesity.  BMI 40.57.  Dietary follow-up 15.  Constipation.  MiraLAX daily, Senokot twice daily.  Colace twice daily as needed 16.  OSA.  Continue CPAP 17.  History of sigmoid diverticulitis perforation 2012.  Follow-up outpatient 18. Rhabdomyolysis: CK is trending downward,  continue to monitor  2/27- per #13    LOS: 3 days A FACE TO FACE EVALUATION WAS PERFORMED  Shane Guzman P Shane Guzman 02/14/2021, 1:16 PM

## 2021-02-14 NOTE — Progress Notes (Signed)
Inpatient Rehabilitation Care Coordinator Assessment and Plan Patient Details  Name: Shane Guzman MRN: [id number] Date of Birth: [birth year]  Today's Date: 02/14/2021  Hospital Problems: Principal Problem:   Debility  Past Medical History:  Past Medical History:  Diagnosis Date  . Diverticulitis of intestine with abscess   . Heart murmur   . Hypertension   . OSA on CPAP    Past Surgical History:  Past Surgical History:  Procedure Laterality Date  . ACHILLES TENDON REPAIR  06/2010   left; S/P tendon rupture   Social History:  reports that he has been smoking cigarettes. He has been smoking about 0.00 packs per day for the past 18.00 years. He has never used smokeless tobacco. He reports current alcohol use. He reports that he does not use drugs.  Family / Support Systems Marital Status: Single Patient Roles: Partner,Other (Comment) (employee and son/grandson) Spouse/Significant Other: Proofreader Other Supports: Jerome-Dad [phone]-cell  Shirley-grandmother [phone]-home Anticipated Caregiver: [name] Ability/Limitations of Caregiver: Almost 24 hr will be alone some Caregiver Availability: Other (Comment) (Almost 24 hr will be alone some) Family Dynamics: Close with [name] and his family, he has always been independent and wants to get back to this level. He has friends who are supportive and involved.  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 He feels the MD's can fix him, but may need to realize it make take time and will have pain now while he heals and progresses. Recent Psychosocial Issues: other health issues-non-compliant with medications Psychiatric History: NO history-pt would benefit from seeing neruo-psych while here for his substance abuse issues and for coping. He needs to realize he will need to change his habits or will come back into the hospital. Substance Abuse History: ETOH and Tobacco aware of the health risks, he is unsure if will quit. Aware of the resources available to him for this.  Patient / Family Perceptions, Expectations & Goals Pt/Family understanding of illness & functional limitations: Pt is able to explain his health issues and what he has been treated for while here. He wants the MD to take away his pain and doesn't understand why they can't. He hopes to go home soon. Premorbid pt/family roles/activities: Employee, boyfriend, son, grandson, freind, etc Anticipated changes in roles/activities/participation: resume Pt/family expectations/goals: Pt states: " I want my pain to be better so I can move around and go home."  [employer]: None Premorbid Home Care/DME Agencies: None Transportation available at discharge: Pt and girlfreind Resource referrals recommended: Neuropsychology  Discharge Planning Living Arrangements: Alone Support Systems: Spouse/significant other,Parent,Other relatives,Friends/neighbors Type of Residence: Private residence Insurance Resources: Media planner (specify) Herbalist) Financial Resources: Employment Financial Screen Referred: No Living Expenses: Rent Money Management: Patient Does the patient have any problems obtaining your  medications?: No (Not always complaint) Home Management: Patient Patient/Family Preliminary Plans: Return home with [name] being there some but will be alone some  also. He wants to go home soon and wants his pain managed. He needs a note for work, will get one for him. Care Coordinator Barriers to Discharge: Medication compliance,Decreased caregiver support Care Coordinator Anticipated Follow Up Needs: HH/OP  Clinical Impression Pleasant but demanding gentleman who wants his pain managed and to go home. Pt needs to realize his lifestyle habits are contributing to his health issues. Will work on discharge needs and get him a note for his employer. Neuro-psych to see while here for substance abuse and coping.  Lucy Chris 02/14/2021, 10:30 AM

## 2021-02-14 NOTE — Progress Notes (Signed)
Visit made to patients room to assist with CPAP.  Patient states he don't want to wear it tonight.  Advised patient if he changes his mind to have RN call me.

## 2021-02-14 NOTE — Progress Notes (Signed)
Inpatient Rehabilitation Center Individual Statement of Services  Patient Name:  Shane Guzman  Date:  02/14/2021  Welcome to the Inpatient Rehabilitation Center.  Our goal is to provide you with an individualized program based on your diagnosis and situation, designed to meet your specific needs.  With this comprehensive rehabilitation program, you will be expected to participate in at least 3 hours of rehabilitation therapies Monday-Friday, with modified therapy programming on the weekends.  Your rehabilitation program will include the following services:  Physical Therapy (PT), Occupational Therapy (OT), 24 hour per day rehabilitation nursing, Neuropsychology, Care Coordinator, Rehabilitation Medicine, Nutrition Services and Pharmacy Services  Weekly team conferences will be held on Tuesday to discuss your progress.  Your Inpatient Rehabilitation Care Coordinator will talk with you frequently to get your input and to update you on team discussions.  Team conferences with you and your family in attendance may also be held.  Expected length of stay: 12-14 days  Overall anticipated outcome: independent with device  Depending on your progress and recovery, your program may change. Your Inpatient Rehabilitation Care Coordinator will coordinate services and will keep you informed of any changes. Your Inpatient Rehabilitation Care Coordinator's name and contact numbers are listed  below.  The following services may also be recommended but are not provided by the Inpatient Rehabilitation Center:   Driving Evaluations  Home Health Rehabiltiation Services  Outpatient Rehabilitation Services  Vocational Rehabilitation   Arrangements will be made to provide these services after discharge if needed.  Arrangements include referral to agencies that provide these services.  Your insurance has been verified to be:  BCBS Your primary doctor is:  Engineer, mining  Pertinent information will be shared  with your doctor and your insurance company.  Inpatient Rehabilitation Care Coordinator:  Dossie Der, [place] [phone] or Luna [place]  Information discussed with and copy given to patient by: Lucy Chris, 02/14/2021, 10:16 AM

## 2021-02-14 NOTE — Progress Notes (Signed)
Physical Therapy Session Note  Patient Details  Name: Shane Guzman MRN: [id number] Date of Birth: 1976-06-17  Today's Date: 02/14/2021 PT Missed Time: 30 Minutes Missed Time Reason: Patient fatigue;Patient unwilling to participate  Short Term Goals: Week 1:  PT Short Term Goal 1 (Week 1): Pt wil perform supine<>sit with CGA PT Short Term Goal 2 (Week 1): Pt will perform sit<>stand using LRAD with CGA PT Short Term Goal 3 (Week 1): Pt will perform bed<>chair transfers using LRAD with CGA PT Short Term Goal 4 (Week 1): Pt will ambulate at least 139ft using LRAD with CGA PT Short Term Goal 5 (Week 1): Pt will ascend/descend 12 steps using HRs with CGA  Skilled Therapeutic Interventions/Progress Updates:    Patient received supine in bed, asleep. He was able to wake up to multimodal cuing briefly, but quickly fell back asleep, snoring. Patient did not wake up again despite hand over hand assist with wet wash cloth to aid in arousal. Patient remaining in bed, bed alarm on, call light within reach. RN aware.   Therapy Documentation Precautions:  Precautions Precautions: Fall,Other (comment) Precaution Comments: LLE edema/numbness/foot drop, back pain Restrictions Weight Bearing Restrictions: No RLE Weight Bearing: Weight bearing as tolerated LLE Weight Bearing: Weight bearing as tolerated Other Position/Activity Restrictions: "WBAT with functional AFO" per ortho MD note 2/20    Therapy/Group: Individual Therapy  Westley Foots 02/14/2021, 7:41 AM

## 2021-02-14 NOTE — IPOC Note (Signed)
Overall Plan of Care [facility]) Patient Details Name: Shane Guzman MRN: [id number] DOB: Jun 01, 1976  Admitting Diagnosis: Debility  Hospital Problems: Principal Problem:   Debility     Functional Problem List: Nursing Bowel,Motor,Pain  PT Balance,Perception,Safety,Edema,Sensory,Endurance,Skin Integrity,Motor,Nutrition,Pain,Behavior  OT Balance,Endurance,Motor,Pain  SLP    TR         Basic ADL's: OT Grooming,Bathing,Dressing,Toileting     Advanced  ADL's: OT Simple Meal Preparation,Laundry     Transfers: PT Bed Mobility,Bed to Chair,Car,Furniture,Floor  OT Toilet,Tub/Shower     Locomotion: PT Ambulation,Stairs     Additional Impairments: OT None  SLP        TR      Anticipated Outcomes Item Anticipated Outcome  Self Feeding independent  Swallowing      Basic self-care  modified independent  Toileting  modified independent   Bathroom Transfers modified independent  Bowel/Bladder  Continue to be continent without constipation.  Transfers  mod-I  Locomotion  mod-I  Communication     Cognition     Pain  Equal to or <3 out of 10 on pain scale.  Safety/Judgment  No falls.   Therapy Plan: PT Intensity: Minimum of 1-2 x/day ,45 to 90 minutes PT Frequency: 5 out of 7 days PT Duration Estimated Length of Stay: 1.5-2 weeks OT Intensity: Minimum of 1-2 x/day, 45 to 90 minutes OT Frequency: 5 out of 7 days OT Duration/Estimated Length of Stay: 10-12 days     Due to the current state of emergency, patients may not be receiving their 3-hours of Medicare-mandated therapy.   Team Interventions: Nursing Interventions Bowel Management,Pain Management  PT interventions Ambulation/gait training,Community reintegration,DME/adaptive equipment instruction,Neuromuscular re-education,Psychosocial support,Stair training,UE/LE Strength taining/ROM,Wheelchair propulsion/positioning,Balance/vestibular training,Discharge planning,Functional electrical stimulation,Pain  management,Skin care/wound management,Therapeutic Activities,UE/LE Coordination activities,Cognitive remediation/compensation,Disease management/prevention,Functional mobility training,Patient/family education,Splinting/orthotics,Therapeutic Exercise,Visual/perceptual remediation/compensation  OT Interventions Balance/vestibular training,Discharge planning,Therapeutic Activities,Self Care/advanced ADL retraining,Disease mangement/prevention,Functional mobility training,Patient/family education,Therapeutic Exercise,DME/adaptive equipment instruction,Neuromuscular re-education,Psychosocial support,UE/LE Strength taining/ROM  SLP Interventions    TR Interventions    SW/CM Interventions Discharge Planning,Psychosocial Support,Patient/Family Education   Barriers to Discharge MD  Medical stability  Nursing      PT Inaccessible home environment,Decreased caregiver support,Home environment access/layout,Lack of/limited family support    OT      SLP      SW Medication compliance,Decreased caregiver support     Team Discharge Planning: Destination: PT-Home ,OT-   , SLP-  Projected Follow-up: PT-Outpatient PT, OT-   , SLP-  Projected Equipment Needs: PT-To be determined, OT-  , SLP-  Equipment Details: PT- , OT-  Patient/family involved in discharge planning: PT- Patient,  OT- , SLP-   MD ELOS: modI 7-8 days Medical Rehab Prognosis:  Excellent Assessment:  Shane Guzman is a 45 year old man who is admitted to CIR with debility. His pain is being managed with Robaxin, Gabapentin, and Oxycodone. An AFO has been ordered for his foot drop. WBC is being monitored given his leukocytosis. LFTs and CK are being monitored given rhabdomyolysis. BP is being monitored given HTN   See Team Conference Notes for weekly updates to the plan of care

## 2021-02-14 NOTE — Progress Notes (Signed)
Occupational Therapy Session Note  Patient Details  Name: Shane Guzman MRN: [id number] Date of Birth: August 30, 1976  Today's Date: 02/14/2021 OT Individual Time: 7412-8786 OT Individual Time Calculation (min): 57 min   Short Term Goals: Week 1:  OT Short Term Goal 1 (Week 1): Pt will complete toilet transfers with supervision using the RW and 3:1. OT Short Term Goal 2 (Week 1): Pt will complete LB bathing with use of AE and supervision. OT Short Term Goal 3 (Week 1): Pt will perform LB dressing with min assist using AE PRN. OT Short Term Goal 4 (Week 1): Pt will complete tub'shower transfer with use of the tub bench and min assist.  Skilled Therapeutic Interventions/Progress Updates:    Pt greeted in bed with c/o 10/10 pain in Lt hip and back. Per pt, he was unable to sleep last night. He did not want to ask RN for any PRN pain medicine, premedicated for pain at around 6am. He initially requested to participate in bedlevel ADL activity. OT set him up with oral care with bed in chair position. When OT set up a wash basin, pt reported feeling well enough to transition to EOB. He did so with setup assist, HOB elevated and using the bedrail. Pt required setup for UB bathing/dressing tasks. Mod-Max A for LB self care due to pts inability to reach feet or obtain figure 4 position. OT tried to facilitate figure 4 however pt unable to tolerate due to pain/hip tightness. CGA for sit<stands and for dynamic standing balance using RW while completing pericare and elevating underwear/pants over hips. He sidestepped towards [facility] using device and then transitioned to semi reclined position. OT then educated pt about holistic pain mgt strategies, provided him with lavender aromatherapy with pt consent and guided him through diaphragmatic breathing exercises 5 reps 2 sets, education provided on using guided imagery to increase effects of relaxation to release tension from muscles. Pt reported resting pain at end of tx  7/10. He wanted to rest to manage his pain vs ask for pain medicine. Pt remained in bed at close of session, all needs within reach and bed alarm set. Tx focus placed on ADL retraining, sit<stands, dynamic balance, and pt education.  Therapy Documentation Precautions:  Precautions Precautions: Fall,Other (comment) Precaution Comments: LLE edema/numbness/foot drop, back pain Restrictions Weight Bearing Restrictions: No RLE Weight Bearing: Weight bearing as tolerated LLE Weight Bearing: Weight bearing as tolerated Other Position/Activity Restrictions: "WBAT with functional AFO" per ortho MD note 2/20 ADL: ADL Eating: Independent Where Assessed-Eating: Chair Grooming: Setup Where Assessed-Grooming: Edge of bed Upper Body Bathing: Supervision/safety Where Assessed-Upper Body Bathing: Edge of bed Lower Body Bathing: Moderate assistance Where Assessed-Lower Body Bathing: Edge of bed Upper Body Dressing: Supervision/safety Where Assessed-Upper Body Dressing: Edge of bed Lower Body Dressing: Maximal assistance Where Assessed-Lower Body Dressing: Edge of bed Toileting: Moderate assistance Where Assessed-Toileting: Bedside Commode Toilet Transfer: Moderate assistance Toilet Transfer Method: Stand pivot Toilet Transfer Equipment: Animator Transfer: Not assessed Film/video editor: Not assessed      Therapy/Group: Individual Therapy  Cannon Quinton A Adara Kittle 02/14/2021, 12:35 PM

## 2021-02-14 NOTE — Progress Notes (Signed)
Physical Therapy Session Note  Patient Details  Name: Shane Guzman MRN: [id number] Date of Birth: 1976-10-04  Today's Date: 02/14/2021 PT Individual Time: 1100-1200 PT Individual Time Calculation (min): 60 min   Short Term Goals: Week 1:  PT Short Term Goal 1 (Week 1): Pt wil perform supine<>sit with CGA PT Short Term Goal 2 (Week 1): Pt will perform sit<>stand using LRAD with CGA PT Short Term Goal 3 (Week 1): Pt will perform bed<>chair transfers using LRAD with CGA PT Short Term Goal 4 (Week 1): Pt will ambulate at least 118ft using LRAD with CGA PT Short Term Goal 5 (Week 1): Pt will ascend/descend 12 steps using HRs with CGA  Skilled Therapeutic Interventions/Progress Updates:    pt received in bed and agreeable to therapy, visitor present. Pt directed in supine>sit CGA with HOB max elevated with pt denying to attempt from flat bed. Pt then reported he needed to use the bathroom, BSC setup, Rolling walker used min A with pt slightly impulsive to transfer to [facility] with pants already at knees and moved very quickly. VC and education for safety awareness and technique. Pt had BM and total A for pericare with min A to stand at walker. Pt then directed in donning pants over hips mod A for standing balance for this. Pt taken to gym in [facility] total A for time and energy. Pt then directed in gait training 150' x2 with Rolling walker at CGA-min A and CGA Sit to stand to initiate. VC for increased step length on RLE and decreased on LLE, trunk extension and walker proximity. Pt then required seated rest break 2/2 pain and fatigue, 8/10 pain in back per pt. Pt then directed in dynamic standing balance for underhanded bean bag toss with target 6' away, first rep x12 with one UE support CGA. Second and third rep x12 no UE support at min A for stability. Pt then directed iN WC mobility back to room 300' min A and requested to sit in recliner min A for transfer with Rolling walker and left with All needs in reach  and in good condition. Call light in hand.  And visitor present.   Therapy Documentation Precautions:  Precautions Precautions: Fall,Other (comment) Precaution Comments: LLE edema/numbness/foot drop, back pain Restrictions Weight Bearing Restrictions: No RLE Weight Bearing: Weight bearing as tolerated LLE Weight Bearing: Weight bearing as tolerated Other Position/Activity Restrictions: "WBAT with functional AFO" per ortho MD note 2/20 General: PT Amount of Missed Time (min): 30 Minutes PT Missed Treatment Reason: Patient fatigue;Patient unwilling to participate Vital Signs:   Pain: Pain Assessment Pain Scale: 0-10 Pain Score: 5  Faces Pain Scale: Hurts a little bit Pain Type: Chronic pain Mobility:   Locomotion :    Trunk/Postural Assessment :    Balance:   Exercises:   Other Treatments:      Therapy/Group: Individual Therapy  Barbaraann Faster 02/14/2021, 12:18 PM

## 2021-02-14 NOTE — Progress Notes (Signed)
Physical Therapy Session Note  Patient Details  Name: Shane Guzman MRN: [id number] Date of Birth: 09-05-1976  Today's Date: 02/14/2021 PT Individual Time: 8938-1017 PT Individual Time Calculation (min): 43 min   Short Term Goals: Week 1:  PT Short Term Goal 1 (Week 1): Pt wil perform supine<>sit with CGA PT Short Term Goal 2 (Week 1): Pt will perform sit<>stand using LRAD with CGA PT Short Term Goal 3 (Week 1): Pt will perform bed<>chair transfers using LRAD with CGA PT Short Term Goal 4 (Week 1): Pt will ambulate at least 143ft using LRAD with CGA PT Short Term Goal 5 (Week 1): Pt will ascend/descend 12 steps using HRs with CGA  Skilled Therapeutic Interventions/Progress Updates:    pt received in bed and agreeable to therapy. Pt directed in supine>sit CGA with HOB slightly elevated. Pt then directed in Stand pivot transfer to [facility] with Rolling walker at CGA. Pt directed in [facility] mobility for 300' +100' CGA for straight paths and extra time and VC min A for turns and more narrow areas. Pt then directed in Stand pivot transfer to NuStep seat CGA. Pt then directed in NuStep for L3 for 5 mins and additional 3 mins post rest break for improved BLE strength and improve reciprocal BLE mobility. Pt then reported he needed to use restroom and did not want to go back to his room, closest restroom occupied and pt taken to next nearest restroom, pt requested to stand to urinate, mod A for stability with clothing management and then min A for stability for upright standing with pt having one UE support at grab bar. Pt then directed in returning to sit in [facility] and directed in WC back to room CGA 150'. Pt directed in Stand pivot transfer to recliner and requested to remain here at end of session, min A for transfer. Pt left All needs in reach and in good condition. Call light in hand.  Alarm set.   Therapy Documentation Precautions:  Precautions Precautions: Fall,Other (comment) Precaution Comments: LLE  edema/numbness/foot drop, back pain Restrictions Weight Bearing Restrictions: No RLE Weight Bearing: Weight bearing as tolerated LLE Weight Bearing: Weight bearing as tolerated Other Position/Activity Restrictions: "WBAT with functional AFO" per ortho MD note 2/20 General:   Vital Signs: Therapy Vitals Temp: 98.6 F (37 C) Pulse Rate: 81 Resp: 18 BP: 127/78 Patient Position (if appropriate): Lying Oxygen Therapy SpO2: 96 % O2 Device: Room Air Pain: Pain Assessment Pain Scale: 0-10 Pain Score: 7  Faces Pain Scale: Hurts little more Pain Type: Chronic pain Pain Location: Back Pain Orientation: Lower Pain Radiating Towards: Leg Pain Descriptors / Indicators: Spasm Pain Frequency: Intermittent Pain Onset: With Activity Patients Stated Pain Goal: 2 Pain Intervention(s): Medication (See eMAR) Multiple Pain Sites: No Mobility:   Locomotion :    Trunk/Postural Assessment :    Balance:   Exercises:   Other Treatments:      Therapy/Group: Individual Therapy  Barbaraann Faster 02/14/2021, 3:30 PM

## 2021-02-15 LAB — CBC WITH DIFFERENTIAL/PLATELET
Abs Immature Granulocytes: 0.07 10*3/uL (ref 0.00–0.07)
Basophils Absolute: 0 10*3/uL (ref 0.0–0.1)
Basophils Relative: 0 %
Eosinophils Absolute: 0.4 10*3/uL (ref 0.0–0.5)
Eosinophils Relative: 3 %
HCT: 34.7 % — ABNORMAL LOW (ref 39.0–52.0)
Hemoglobin: 12.1 g/dL — ABNORMAL LOW (ref 13.0–17.0)
Immature Granulocytes: 1 %
Lymphocytes Relative: 15 %
Lymphs Abs: 2.1 10*3/uL (ref 0.7–4.0)
MCH: 34.8 pg — ABNORMAL HIGH (ref 26.0–34.0)
MCHC: 34.9 g/dL (ref 30.0–36.0)
MCV: 99.7 fL (ref 80.0–100.0)
Monocytes Absolute: 0.9 10*3/uL (ref 0.1–1.0)
Monocytes Relative: 6 %
Neutro Abs: 10.7 10*3/uL — ABNORMAL HIGH (ref 1.7–7.7)
Neutrophils Relative %: 75 %
Platelets: 320 10*3/uL (ref 150–400)
RBC: 3.48 MIL/uL — ABNORMAL LOW (ref 4.22–5.81)
RDW: 11.9 % (ref 11.5–15.5)
WBC: 14.1 10*3/uL — ABNORMAL HIGH (ref 4.0–10.5)
nRBC: 0 % (ref 0.0–0.2)

## 2021-02-15 MED ORDER — FUROSEMIDE 40 MG PO TABS
40.0000 mg | ORAL_TABLET | Freq: Once | ORAL | Status: AC
  Administered 2021-02-15: 40 mg via ORAL
  Filled 2021-02-15: qty 1

## 2021-02-15 NOTE — Progress Notes (Signed)
Occupational Therapy Session Note  Patient Details  Name: Shane Guzman MRN: [id number] Date of Birth: 01/18/1976  Today's Date: 02/15/2021 OT Individual Time: 2119-4174 OT Individual Time Calculation (min): 27 min    Short Term Goals: Week 1:  OT Short Term Goal 1 (Week 1): Pt will complete toilet transfers with supervision using the RW and 3:1. OT Short Term Goal 2 (Week 1): Pt will complete LB bathing with use of AE and supervision. OT Short Term Goal 3 (Week 1): Pt will perform LB dressing with min assist using AE PRN. OT Short Term Goal 4 (Week 1): Pt will complete tub'shower transfer with use of the tub bench and min assist.   Skilled Therapeutic Interventions/Progress Updates:    Pt greeted at time of session supine in bed resting with friend present, remained throughout session. Pt did not awaken with verbal stimuli, needing firm tactile cues to wake up. Supine > sit Min/Mod A and Stand pivot bed > wheelchair Min/CGA with RW, wheelchair brought to bathroom threshold and took a few steps to turn to commode to stand over toilet with RW to urinate CGA for clothing management. Stepped back to wheelchair CGA with RW. Set up at sink to wash face and oral hygiene. Stand pivot wheelchair > recliner CGA/Min A with RW, LEs elevated and ice pack applied to L hip d/t pain and swelling. No # given for pain. Alarm on call bell in reach.   Therapy Documentation Precautions:  Precautions Precautions: Fall,Other (comment) Precaution Comments: LLE edema/numbness/foot drop, back pain Restrictions Weight Bearing Restrictions: No RLE Weight Bearing: Weight bearing as tolerated LLE Weight Bearing: Weight bearing as tolerated Other Position/Activity Restrictions: "WBAT with functional AFO" per ortho MD note 2/20     Therapy/Group: Individual Therapy  [name] 02/15/2021, 11:00 AM

## 2021-02-15 NOTE — Progress Notes (Addendum)
Physical Therapy Session Note  Patient Details  Name: Shane Guzman MRN: [id number] Date of Birth: 07/03/1976  Today's Date: 02/15/2021 PT Individual Time: 1301-1400 and 1500-1525 PT Individual Time Calculation (min): 59 min and 25 min  Short Term Goals: Week 1:  PT Short Term Goal 1 (Week 1): Pt wil perform supine<>sit with CGA PT Short Term Goal 2 (Week 1): Pt will perform sit<>stand using LRAD with CGA PT Short Term Goal 3 (Week 1): Pt will perform bed<>chair transfers using LRAD with CGA PT Short Term Goal 4 (Week 1): Pt will ambulate at least 161f using LRAD with CGA PT Short Term Goal 5 (Week 1): Pt will ascend/descend 12 steps using HRs with CGA  Skilled Therapeutic Interventions/Progress Updates:    pt received in recliner and agreeable to therapy but reporting he needed to have a BM. Pt directed in gait training with Rolling walker 10' in room with transfer to toilet at min A grossly throughout for stability. Pt reported BSC over toilet too high and he would benefit from shorter seat, pt returned to standing CGA and BSC removed from toilet. Pt then sat at lower toilet seat height without BSC. Pt had BM and urinated, per pt his swelling makes it difficult to urinate while sitting and he had urinated on the floor. Nursing present to assist with pt toilet transfer for safety. Pt min A to stand for low toilet to Rolling walker and dependent for pericare. Pt mod A for pulling pants over hips. Pt then directed in gait to bedisde 8' min A and sat for changing socks and donning B shoes. Pt then directed in [facility] to gym for time and energy. [employer] representative present for bracing consultation. Pt directed in gait training with Rolling walker for 150' min A with VC for trunk extension, relaxing BUE, decreased step length on LLE and increased step length on RLE. Pt reported 8/10 pain in low back and L thigh/groin area. [facility] [employer] rep  suggested a foot-up brace based on his gait. PT retrieved this and representative donned onto pt's shoe. Pt then directed in gait with new brace in place improved to CGA with noted improvement in LLE control, steppage with heel/toe pattern and improved stride length. Pt then educated on bracing, reasons for this use and representative suggested shoes for pt to put this in based on his comfort. Pt then directed in ascending and descending 2x12 stairs with B handrails min A with step to pattern VC for sequencing. Pt reported fatigue post this and required rest break. Pt then agreeable to gait back to room, 250' CGA with brace. Pt requested to rest in bed, sit>supine CGA. Pt left in bed, All needs in reach and in good condition. Call light in hand.  And alarm set. Nursing assisted pt during session with pain medication for pt comfort.    Session 2: pt received in bed and agreeable to therapy. Pt directed in supine>sit with HOB elevated CGA; sat EOB total A for shoe donning with L foot-up brace. Pt then directed in gait training with Rolling walker 200' CGA with VC for decreased step length on LLE and increased step length on RLE, post seated rest break ambulated 150' CGA with similar cues. Pt reported fatigue and requested to complete standing activity instead of gait. Pt then directed in 2x5 Sit [facility] for increased weight shift to LLE for equal  stance. Pt then directed in gait 10' without AD to bedside min A with mild LOB to R side and VC for increased weight shift to LLE for stepping. Pt then directed in sit>supine in bed, CGA. Pt left in bed, All needs in reach and in good condition. Call light in hand.  And all needs met, alarm set.   Therapy Documentation Precautions:  Precautions Precautions: Fall,Other (comment) Precaution Comments: LLE edema/numbness/foot drop, back pain Restrictions Weight Bearing Restrictions: No RLE Weight Bearing: Weight bearing as tolerated LLE  Weight Bearing: Weight bearing as tolerated Other Position/Activity Restrictions: "WBAT with functional AFO" per ortho MD note 2/20 General:   Vital Signs:   Pain:   Mobility:   Locomotion :    Trunk/Postural Assessment :    Balance:   Exercises:   Other Treatments:      Therapy/Group: Individual Therapy  Junie Panning 02/15/2021, 2:54 PM

## 2021-02-15 NOTE — Progress Notes (Signed)
Occupational Therapy Session Note  Patient Details  Name: Shane Guzman MRN: [id number] Date of Birth: Aug 30, 1976  Today's Date: 02/15/2021 OT Individual Time: 4268-3419 OT Individual Time Calculation (min): 59 min    Short Term Goals: Week 1:  OT Short Term Goal 1 (Week 1): Pt will complete toilet transfers with supervision using the RW and 3:1. OT Short Term Goal 2 (Week 1): Pt will complete LB bathing with use of AE and supervision. OT Short Term Goal 3 (Week 1): Pt will perform LB dressing with min assist using AE PRN. OT Short Term Goal 4 (Week 1): Pt will complete tub'shower transfer with use of the tub bench and min assist.  Skilled Therapeutic Interventions/Progress Updates:    Pt sitting up in recliner with cold pack applied to left thigh, reports 8/10 pain in low back and LLE, but reports he just received pain medicine and agreeable to taking a shower during OT session.  Pt ambulated to bathroom using RW and completed tub bench transfer in walk in shower with CGA.  Pt doffed shirt with mod I, and doffed underwear, pants with CGA using grab bars for sit<>stand and standing balance.  Educated pt on use of reacher to doff sock and pt return demonstrated with supervision.  Total assist to doff TED hose.  Pt bathed UB and LB with min assist for bilateral feet due to no long handled sponges available at this time, however OT educated pt verbally on use of AE to increase independence during bathing.  Pt ambulated back to room with RW and completed stand to sit at recliner all with CGA.  Pt donned shirt with setup.  Educated pt on use of reacher to donn underwear and pants and pt return demonstrated with supervision.  Total assist to donn TED hose.  Pt donned socks with setup after education and demo provided.  Cold pack applied to low back per pt request to address pain, call bell in reach, BLE elevated, seat alarm on at end of session.  Good carryover with AE overall after education and training  provided with increased independence during self care noted this session.  Therapy Documentation Precautions:  Precautions Precautions: Fall,Other (comment) Precaution Comments: LLE edema/numbness/foot drop, back pain Restrictions Weight Bearing Restrictions: No RLE Weight Bearing: Weight bearing as tolerated LLE Weight Bearing: Weight bearing as tolerated Other Position/Activity Restrictions: "WBAT with functional AFO" per ortho MD note 2/20   Therapy/Group: Individual Therapy  Amie Critchley 02/15/2021, 12:49 PM

## 2021-02-15 NOTE — Progress Notes (Signed)
Patient ID: Shane Guzman, male   DOB: 07/24/1976, 44 y.o.   MRN: [id number]  Met with pt and his girlfriend who is here to discuss team conference goals mod/i level and target discharge date of 3/14. He felt this was long, but aware will need to go up 12 steps into home. Discussed can move up discharge date if he progresses more quickly and can do the 12 steps. Girlfriend reports she will be there most of the time to assist him. Will need a letter for work with a discharge date on it, will re-do letter. Continue to work on discharge needs.  

## 2021-02-15 NOTE — Progress Notes (Signed)
Orthopedic Tech Progress Note Patient Details:  Shane Guzman 27-Mar-1976 [id number] Called in order to [employer] again for an AFO Patient ID: Shane Guzman, male   DOB: 1976-07-20, 45 y.o.   MRN: [id number]   [name] 02/15/2021, 2:45 PM

## 2021-02-15 NOTE — Progress Notes (Signed)
PROGRESS NOTE   Subjective/Complaints:   Pt reports he's concerned about fluid that's in hips/buttocks and legs- worried where it's coming from and what to do about it.  Also having back pain, but meds "help some".  Denies Sx's of UTI or resp issues.    ROS:  Pt denies SOB, abd pain, CP, N/V/C/D, and vision changes   Objective:   No results found. Recent Labs    02/14/21 0519 02/15/21 0722  WBC 13.9* 14.1*  HGB 11.9* 12.1*  HCT 35.3* 34.7*  PLT 290 320   Recent Labs    02/14/21 0519  NA 136  K 3.8  CL 102  CO2 24  GLUCOSE 134*  BUN 14  CREATININE 1.28*  CALCIUM 8.6*    Intake/Output Summary (Last 24 hours) at 02/15/2021 0925 Last data filed at 02/15/2021 0900 Gross per 24 hour  Intake 614 ml  Output 1980 ml  Net -1366 ml        Physical Exam: Vital Signs Blood pressure 130/89, pulse 89, temperature 98.2 F (36.8 C), temperature source Oral, resp. rate 18, height 5\' 5"  (1.651 m), weight 112.7 kg, SpO2 97 %.   Physical Exam  General: awake, alert, appropriate, NAD- laying supine in bed HENT: conjugate gaze; oropharynx moist CV: regular rate and rhythm; no JVD Pulmonary: CTA B/L; no W/R/R- good air movement- no fluid in lungs GI: soft, NT, ND, (+)BS Psychiatric: appropriate- flat affect Neurological: Ox3  Neuro: Ext: has a lot of pitting- 2-3+ in Legs and up to hips B/L- L>R  Skin: intact Neurological: Ox3  Upper extremities 5/5 bilaterally LLE 3/5 throughout, limited by pain and edema. 0/5 DF and PF    Assessment/Plan: 1. Functional deficits which require 3+ hours per day of interdisciplinary therapy in a comprehensive inpatient rehab setting.  Physiatrist is providing close team supervision and 24 hour management of active medical problems listed below.  Physiatrist and rehab team continue to assess barriers to discharge/monitor patient progress toward functional and medical  goals  Care Tool:  Bathing    Body parts bathed by patient: Right arm,Left arm,Chest,Abdomen,Front perineal area,Buttocks,Right upper leg,Left upper leg,Face   Body parts bathed by helper: Right lower leg,Left lower leg     Bathing assist Assist Level: Minimal Assistance - Patient > 75%     Upper Body Dressing/Undressing Upper body dressing   What is the patient wearing?: Pull over shirt    Upper body assist Assist Level: Set up assist    Lower Body Dressing/Undressing Lower body dressing      What is the patient wearing?: Underwear/pull up,Pants     Lower body assist Assist for lower body dressing: Moderate Assistance - Patient 50 - 74%     Toileting Toileting    Toileting assist Assist for toileting: Moderate Assistance - Patient 50 - 74%     Transfers Chair/bed transfer  Transfers assist     Chair/bed transfer assist level: Moderate Assistance - Patient 50 - 74%     Locomotion Ambulation   Ambulation assist      Assist level: Moderate Assistance - Patient 50 - 74% Assistive device: No Device Max distance: 79ft   Walk 10 feet  activity   Assist     Assist level: Moderate Assistance - Patient - 50 - 74% Assistive device: No Device   Walk 50 feet activity   Assist Walk 50 feet with 2 turns activity did not occur: Safety/medical concerns         Walk 150 feet activity   Assist Walk 150 feet activity did not occur: Safety/medical concerns         Walk 10 feet on uneven surface  activity   Assist Walk 10 feet on uneven surfaces activity did not occur: Safety/medical concerns         Wheelchair     Assist Will patient use wheelchair at discharge?: No             Wheelchair 50 feet with 2 turns activity    Assist            Wheelchair 150 feet activity     Assist          Blood pressure 130/89, pulse 89, temperature 98.2 F (36.8 C), temperature source Oral, resp. rate 18, height 5\' 5"  (1.651  m), weight 112.7 kg, SpO2 97 %.  Medical Problem List and Plan: 1.   Debility secondary to AKI from combination of volume contraction/pigment nephropathy/acute metabolic encephalopathy from rhabdo.  Cranial CT scan negative             -patient may shower             -ELOS/Goals: modI in 7-8 days  -Continue CIR 2.  Antithrombotics: -DVT/anticoagulation: Continue Subcutaneous heparin             -antiplatelet therapy: N/A 3. Pain Management: Robaxin and oxycodone as needed  2/27- added Gabapentin 300 mg TID for nerve pain and increased oxy to 5-10 mg q4 hours prn; lidoderm wasn't helpful- stopped it for back pain.   3/1- taking oxy for back pain prn - tolerating gabapentin 4. Mood: Provide emotional support             -antipsychotic agents: N/A 5. Neuropsych: This patient is capable of making decisions on his own behalf. 6. Skin/Wound Care: Routine skin checks 7. Fluids/Electrolytes/Nutrition: Routine in and outs with follow-up chemistries 8.  Left foot drop/left peroneal nerve compression.  Follow-up outpatient nerve conduction study. Will benefit from an AFO- ordered 9.  Leukocytosis question aspiration.  Continue Augmentin x2 more days and stop.  2/27- labs in AM s/p Augmentin  2/28: WBC reviewed- 13.9- stable but trending upward, repeat tomorrow to trend 10.  History of alcohol tobacco use.  Provide counseling 11.  Abnormal LFTs/transaminitis due to rhabdo.  Follow-up liver function studies  2/27- Last LFTs in 400s- will recheck Monday am to see downtrend, hopefully  2/28: LFTS reviewed: trending downward, repeat in 1 week.   3/1- down to 100s- much improved 12.  Hypertension.  Lopressor 12.5 mg twice daily.  Monitor with increased mobility  2/27- BP slightly elevated in 140s systolic- pulse 80s- if stays up, will increase Lopressor  3/1- BP 130/89- doing great- con't regimen 13.  AKI from combination of volume contraction/rhabdo.  Continue IV fluids x24 more hours.  Cr currently  1.19- resolved, continue to monitor.  Nephrology follow-up  2/27- CK is down to 6000 from 32K on 2/23- max was >50k- - Cr doing well 14.  Morbid obesity.  BMI 40.57.  Dietary follow-up 15.  Constipation.  MiraLAX daily, Senokot twice daily.  Colace twice daily as needed 16.  OSA.  Continue CPAP  17.  History of sigmoid diverticulitis perforation 2012.  Follow-up outpatient 18. Rhabdomyolysis: CK is trending downward, continue to monitor  2/27- per #13  3/1- called renal due to swelling/pitting edema- they agreed that we need to stop IVFs- were stopped- will also give 1 dose of Lasix 40 mg PO.     LOS: 4 days A FACE TO FACE EVALUATION WAS PERFORMED  Rilla Buckman 02/15/2021, 9:25 AM

## 2021-02-15 NOTE — Patient Care Conference (Signed)
Inpatient RehabilitationTeam Conference and Plan of Care Update Date: 02/15/2021   Time: 11:37 AM   Patient Name: Shane Guzman      Medical Record Number: [id number]  Date of Birth: 1975-12-29 Sex: Male         Room/Bed: 4M05C/4M05C-01 Payor Info: Payor: BLUE CROSS BLUE SHIELD / Plan: BCBS COMM PPO / Product Type: *No Product type* /    Admit Date/Time:  02/11/2021 10:14 PM  Primary Diagnosis:  Debility  Hospital Problems: Principal Problem:   Debility    Expected Discharge Date: Expected Discharge Date: 02/28/21  Team Members Present: Physician leading conference: Dr. Genice Rouge Care Coodinator Present: Kennyth Arnold, RN, BSN, CRRN;Becky Dupree, LCSW Nurse Present: Other (comment) Freddrick March, RN) PT Present: Otelia Sergeant, PT OT Present: Earleen Newport, OT PPS Coordinator present : Fae Pippin, SLP     Current Status/Progress Goal Weekly Team Focus  Bowel/Bladder   Continent of bowel and bladder. Large loose BM on 2/28. Abdomen contines to be distended.  Will remain continent and regular.  Assess bowel and bladder needs every shift and PRN.   Swallow/Nutrition/ Hydration             ADL's   Min A bathing EOB sit<stand with RW, Setup UB dressing, Max A LB dressing, Mod A stand pivot toilet transfers  Mod I overall  ADL retraining, functional transfers, standing balance, holistic pain mgt   Mobility   min A bed mob; CGA-min A transfers; gait min A 150' with RW; decreased foot clearance on LLE  mod I STS; bed mob I; supervision car transfers; mod I 150' ; 50' home gait supervision; 12 steps supervision  stair training, gait training, strengthening, pain management, bed mob, transfers   Communication             Safety/Cognition/ Behavioral Observations            Pain   Pain from 2-9 out of 10 in lower back.Current meds mostly effective with occasional breakthrough.  Pain equal to or < 3.  Assess every shift and PRN   Skin   No skin issues.  Will remain free  of skin breakdown.  Assess skin every shift.     Discharge Planning:  Home with family and girlfreind chekcing in on him at times will be alone. Pain issues and wants managed before discharged from CIR   Team Discussion: Found down, now has foot drop. Came in with severe Rabdo, kidney function is looking okay. Increased WBC's, metabolic encephalopathy. Had loose stool last night. Medication given for pain. Going home with girlfriend. Patient on target to meet rehab goals: Min assist to contact guard assist with transfers, pain to the left hip/leg. Min assist with ADL's. Max assist for toileting hygiene. Urinated a lot, loose bowel movement with therapy. Ambulated 182ft min assist. AFO fitting today. Can be impulsive. 12 steps to get into home.  *See Care Plan and progress notes for long and short-term goals.   Revisions to Treatment Plan:  Stopped IVF's today, will give Lasix 40mg  x1 PO today.  Teaching Needs: Family education, medication management, pain management, skin/wound care, endurance management, transfer training, gait training, stair training.  Current Barriers to Discharge: Inaccessible home environment, Decreased caregiver support, Home enviroment access/layout, Incontinence, Wound care, Weight, Medication compliance and Behavior  Possible Resolutions to Barriers: Continue current medications, provide emotional support.     Medical Summary Current Status: WBC ~ 14k- has been stable; was on IVFs 100cc/hr- off IVFs; no other  issues; having pain - even with oxy prn; loose stools this AM- LBM;  Barriers to Discharge: Other (comments);Decreased family/caregiver support;Behavior;Home enviroment access/layout;Medical stability;Weight;Medication compliance  Barriers to Discharge Comments: home with GF, but alone a lot. consult to get AFO fitting for LLE- due to L foot drop- slightly impulsive. 12 stairs to enter home. Possible Resolutions to [employer]: stopped IVFs today-  fluid overloaded; retaining fluid- will also give Lasix 40 mg x1 PO today- see if needs more- monitor labs - d/c 02/28/21- get therapy over weekend   Continued Need for Acute Rehabilitation Level of Care: The patient requires daily medical management by a physician with specialized training in physical medicine and rehabilitation for the following reasons: Direction of a multidisciplinary physical rehabilitation program to maximize functional independence : Yes Medical management of patient stability for increased activity during participation in an intensive rehabilitation regime.: Yes Analysis of laboratory values and/or radiology reports with any subsequent need for medication adjustment and/or medical intervention. : Yes   I attest that I was present, lead the team conference, and concur with the assessment and plan of the team.   Tennis Must 02/15/2021, 5:46 PM

## 2021-02-16 MED ORDER — FUROSEMIDE 40 MG PO TABS
40.0000 mg | ORAL_TABLET | Freq: Every day | ORAL | Status: AC
  Administered 2021-02-16 – 2021-02-17 (×2): 40 mg via ORAL
  Filled 2021-02-16 (×2): qty 1

## 2021-02-16 NOTE — Progress Notes (Signed)
PROGRESS NOTE   Subjective/Complaints:    Pt reports still having some swelling in LEs as well as thighs/hips- not in scrotum.   Asking for water pill again.    ROS:  Pt denies SOB, abd pain, CP, N/V/C/D, and vision changes   Objective:   No results found. Recent Labs    02/14/21 0519 02/15/21 0722  WBC 13.9* 14.1*  HGB 11.9* 12.1*  HCT 35.3* 34.7*  PLT 290 320   Recent Labs    02/14/21 0519  NA 136  K 3.8  CL 102  CO2 24  GLUCOSE 134*  BUN 14  CREATININE 1.28*  CALCIUM 8.6*    Intake/Output Summary (Last 24 hours) at 02/16/2021 5631 Last data filed at 02/16/2021 0431 Gross per 24 hour  Intake 494 ml  Output 2720 ml  Net -2226 ml        Physical Exam: Vital Signs Blood pressure 121/83, pulse 85, temperature 98.3 F (36.8 C), temperature source Oral, resp. rate 20, height 5\' 5"  (1.651 m), weight 99 kg, SpO2 95 %.   Physical Exam    General: awake, alert, appropriate, NAD; laying supine in bed HENT: conjugate gaze; oropharynx moist CV: regular rate; no JVD Pulmonary: CTA B/L; no W/R/R- good air movement GI: soft, NT, ND, (+)BS Psychiatric: appropriate, but a little standoffish/cordial Neurological: Ox3 Ext: has a lot less pitting edema in thighs/hips/ and calves  Skin: intact Neurological: Ox3  Upper extremities 5/5 bilaterally LLE 3/5 throughout, limited by pain and edema. 0/5 DF and PF    Assessment/Plan: 1. Functional deficits which require 3+ hours per day of interdisciplinary therapy in a comprehensive inpatient rehab setting.  Physiatrist is providing close team supervision and 24 hour management of active medical problems listed below.  Physiatrist and rehab team continue to assess barriers to discharge/monitor patient progress toward functional and medical goals  Care Tool:  Bathing    Body parts bathed by patient: Right arm,Left arm,Chest,Abdomen,Front perineal  area,Buttocks,Right upper leg,Left upper leg,Face   Body parts bathed by helper: Left lower leg,Right lower leg     Bathing assist Assist Level: Minimal Assistance - Patient > 75%     Upper Body Dressing/Undressing Upper body dressing   What is the patient wearing?: Pull over shirt    Upper body assist Assist Level: Set up assist    Lower Body Dressing/Undressing Lower body dressing      What is the patient wearing?: Underwear/pull up,Pants     Lower body assist Assist for lower body dressing: Supervision/Verbal cueing     Toileting Toileting    Toileting assist Assist for toileting: Contact Guard/Touching assist (urine only standing over toilet)     Transfers Chair/bed transfer  Transfers assist     Chair/bed transfer assist level: Minimal Assistance - Patient > 75%     Locomotion Ambulation   Ambulation assist      Assist level: Moderate Assistance - Patient 50 - 74% Assistive device: No Device Max distance: 34ft   Walk 10 feet activity   Assist     Assist level: Moderate Assistance - Patient - 50 - 74% Assistive device: No Device   Walk 50 feet activity  Assist Walk 50 feet with 2 turns activity did not occur: Safety/medical concerns         Walk 150 feet activity   Assist Walk 150 feet activity did not occur: Safety/medical concerns         Walk 10 feet on uneven surface  activity   Assist Walk 10 feet on uneven surfaces activity did not occur: Safety/medical concerns         Wheelchair     Assist Will patient use wheelchair at discharge?: No             Wheelchair 50 feet with 2 turns activity    Assist            Wheelchair 150 feet activity     Assist          Blood pressure 121/83, pulse 85, temperature 98.3 F (36.8 C), temperature source Oral, resp. rate 20, height 5\' 5"  (1.651 m), weight 99 kg, SpO2 95 %.  Medical Problem List and Plan: 1.   Debility secondary to AKI from  combination of volume contraction/pigment nephropathy/acute metabolic encephalopathy from rhabdo.  Cranial CT scan negative             -patient may shower             -ELOS/Goals: modI in 7-8 days  -Continue CIR 2.  Antithrombotics: -DVT/anticoagulation: Continue Subcutaneous heparin             -antiplatelet therapy: N/A 3. Pain Management: Robaxin and oxycodone as needed  2/27- added Gabapentin 300 mg TID for nerve pain and increased oxy to 5-10 mg q4 hours prn; lidoderm wasn't helpful- stopped it for back pain.   3/1- taking oxy for back pain prn - tolerating gabapentin  3/2 - pain tolerable- con't regimen 4. Mood: Provide emotional support             -antipsychotic agents: N/A 5. Neuropsych: This patient is capable of making decisions on his own behalf. 6. Skin/Wound Care: Routine skin checks 7. Fluids/Electrolytes/Nutrition: Routine in and outs with follow-up chemistries 8.  Left foot drop/left peroneal nerve compression.  Follow-up outpatient nerve conduction study. Will benefit from an AFO- ordered  3/2- AFO delivered 9.  Leukocytosis question aspiration.  Continue Augmentin x2 more days and stop.  2/27- labs in AM s/p Augmentin  2/28: WBC reviewed- 13.9- stable but trending upward, repeat tomorrow to trend 10.  History of alcohol tobacco use.  Provide counseling 11.  Abnormal LFTs/transaminitis due to rhabdo.  Follow-up liver function studies  2/27- Last LFTs in 400s- will recheck Monday am to see downtrend, hopefully  2/28: LFTS reviewed: trending downward, repeat in 1 week.   3/1- down to 100s- much improved 12.  Hypertension.  Lopressor 12.5 mg twice daily.  Monitor with increased mobility  2/27- BP slightly elevated in 140s systolic- pulse 80s- if stays up, will increase Lopressor  3/1- BP 130/89- doing great- con't regimen  3/2- BP well controlled- con't regimen 13.  AKI from combination of volume contraction/rhabdo.  Continue IV fluids x24 more hours.  Cr currently 1.19-  resolved, continue to monitor.  Nephrology follow-up  2/27- CK is down to 6000 from 32K on 2/23- max was >50k- - Cr doing well  3/2- Recheck CK tomorrow- and monitor Cr/BUN.  14.  Morbid obesity.  BMI 40.57.  Dietary follow-up 15.  Constipation.  MiraLAX daily, Senokot twice daily.  Colace twice daily as needed 16.  OSA.  Continue CPAP 17.  History  of sigmoid diverticulitis perforation 2012.  Follow-up outpatient 18. Rhabdomyolysis: CK is trending downward, continue to monitor  2/27- per #13  3/1- called renal due to swelling/pitting edema- they agreed that we need to stop IVFs- were stopped- will also give 1 dose of Lasix 40 mg PO.   3/2- give another Lasix 40 mg daily x 2 days- and recheck CK tomorrow.     LOS: 5 days A FACE TO FACE EVALUATION WAS PERFORMED  Megan Lovorn 02/16/2021, 8:12 AM

## 2021-02-16 NOTE — Progress Notes (Signed)
Occupational Therapy Session Note  Patient Details  Name: Shane Guzman MRN: [id number] Date of Birth: 08-26-1976  Today's Date: 02/16/2021 OT Individual Time: 1345-1445 OT Individual Time Calculation (min): 60 min    Short Term Goals: Week 1:  OT Short Term Goal 1 (Week 1): Pt will complete toilet transfers with supervision using the RW and 3:1. OT Short Term Goal 2 (Week 1): Pt will complete LB bathing with use of AE and supervision. OT Short Term Goal 3 (Week 1): Pt will perform LB dressing with min assist using AE PRN. OT Short Term Goal 4 (Week 1): Pt will complete tub'shower transfer with use of the tub bench and min assist.  Skilled Therapeutic Interventions/Progress Updates:    1;1. Pt stand pivot transfer throughout session with MIN A and RW with VC for safe RW management. Pt educated on TTB options and safe home adaptations for shower. Pt completes TTB transfer with CGA overall and agreeable to understanding needing equipment d/t pain/standing tolerance/endurance. Initially pt resistant to recommendation however with gentle education understanding. Pt taken to outside courtyard to compelte UB therex d/t leg pain impacting standing participation (ice provided/repositioned at end of session). Pt completes 2x10-15 dowel rod therex for BUE shoulder strengthening required for BADLs/functional transfers as follows with demo cuing and 4 # dowel rod  Shoulder flex/ext, shoulder press, horizonal ab/adduct Circles in B directions Elbow flex/ext, chest press Wrist flex/ext Pt reporting outside trip really helping mood.  Exited session with pt seated in bed, exit alarm on and call light in reach   Therapy Documentation Precautions:  Precautions Precautions: Fall,Other (comment) Precaution Comments: LLE edema/numbness/foot drop, back pain Restrictions Weight Bearing Restrictions: No RLE Weight Bearing: Weight bearing as tolerated LLE Weight Bearing: Weight bearing as tolerated Other  Position/Activity Restrictions: "WBAT with functional AFO" per ortho MD note 2/20 General:   Vital Signs: Therapy Vitals Temp: 98.3 F (36.8 C) Temp Source: Oral Pulse Rate: 85 Resp: 20 BP: 121/83 Patient Position (if appropriate): Lying Oxygen Therapy SpO2: 95 % O2 Device: Room Air Pain: Pain Assessment Pain Scale: 0-10 Pain Score: 2  Pain Location: Back ADL: ADL Eating: Independent Where Assessed-Eating: Chair Grooming: Setup Where Assessed-Grooming: Edge of bed Upper Body Bathing: Supervision/safety Where Assessed-Upper Body Bathing: Edge of bed Lower Body Bathing: Moderate assistance Where Assessed-Lower Body Bathing: Edge of bed Upper Body Dressing: Supervision/safety Where Assessed-Upper Body Dressing: Edge of bed Lower Body Dressing: Maximal assistance Where Assessed-Lower Body Dressing: Edge of bed Toileting: Moderate assistance Where Assessed-Toileting: Bedside Commode Toilet Transfer: Moderate assistance Toilet Transfer Method: Stand pivot Acupuncturist: Gaffer: Not assessed Film/video editor: Not assessed Vision   Perception    Praxis   Exercises:   Other Treatments:     Therapy/Group: Individual Therapy  Shon Hale 02/16/2021, 6:48 AM

## 2021-02-16 NOTE — Progress Notes (Signed)
Physical Therapy Session Note  Patient Details  Name: Shane Guzman MRN: [id number] Date of Birth: 11-01-1976  Today's Date: 02/16/2021 PT Individual Time: 0835-0920 PT Individual Time Calculation (min): 45 min   Short Term Goals: Week 1:  PT Short Term Goal 1 (Week 1): Pt wil perform supine<>sit with CGA PT Short Term Goal 2 (Week 1): Pt will perform sit<>stand using LRAD with CGA PT Short Term Goal 3 (Week 1): Pt will perform bed<>chair transfers using LRAD with CGA PT Short Term Goal 4 (Week 1): Pt will ambulate at least 114f using LRAD with CGA PT Short Term Goal 5 (Week 1): Pt will ascend/descend 12 steps using HRs with CGA  Skilled Therapeutic Interventions/Progress Updates:   Pt received supine in bed and agreeable to PT. Supine>sit transfer with supervision assist and increased time. PT applied ted hose and socks for patient. Ambulatory transfer to and from toilet with supervision assist and RW. Pt able to void bladder standing at toilet.   Lower body dressing with mod assist to manage clothes over the LLE. And to don shoes and L foot up brace. Facial and oral hygiene at sink with set up assist from PT.    Gait training with RW x 1516fwith supervision assist and L foot-up brace, min cues for symmetrical step length with noted improvement for last 5068f  WC mobility training x [id number] supervision assist and min cues for doorway management into day room.   Kinetron BLE activity tolerance training 3 x 1 min with cues for symmetry R and L and improved posture intermittently. Patient returned to room and performed stand pivot to recliner with RW and supervision assist. Pt left sitting in recliner with call bell in reach and all needs met.           Therapy Documentation Precautions:  Precautions Precautions: Fall,Other (comment) Precaution Comments: LLE edema/numbness/foot drop, back pain Restrictions Weight Bearing Restrictions: No RLE Weight Bearing: Weight bearing  as tolerated LLE Weight Bearing: Weight bearing as tolerated Other Position/Activity Restrictions: "WBAT with functional AFO" per ortho MD note 2/20 Vital Signs: Therapy Vitals Pulse Rate: 78 BP: 118/68 Pain: Pain Assessment Pain Scale: 0-10 Pain Score: 2  Pain Location: Back  Therapy/Group: Individual Therapy  AustLorie Phenix/2022, 9:25 AM

## 2021-02-16 NOTE — Progress Notes (Signed)
Physical Therapy Session Note  Patient Details  Name: Shane Guzman MRN: [id number] Date of Birth: [birth year]  Today's Date: 02/16/2021 PT Individual Time: 1100-1150  PT Individual Time Calculation (min): 50 min   Short Term Goals: Week 1:  PT Short Term Goal 1 (Week 1): Pt wil perform supine<>sit with CGA PT Short Term Goal 2 (Week 1): Pt will perform sit<>stand using LRAD with CGA PT Short Term Goal 3 (Week 1): Pt will perform bed<>chair transfers using LRAD with CGA PT Short Term Goal 4 (Week 1): Pt will ambulate at least 177ft using LRAD with CGA PT Short Term Goal 5 (Week 1): Pt will ascend/descend 12 steps using HRs with CGA  Skilled Therapeutic Interventions/Progress Updates:    pt received in recliner and agreeable to therapy. Pt directed in Sit to stand from recliner to Rolling walker at CGA, VC for hand placement. Pt directed in gait training with Rolling walker 150' CGA VC for gait pattern. Pt then directed in gait with Rolling walker 100'x2 CGA similar cues. Pt required rest breaks x3 between reps. Pt directed in ascending/descending x12 stairs with B handrails, CGA with VC for sequencing. Pt directed in NMRE at [employer] 3 mins with small LOS targets activity min A for stability and pt reported he had increased pain with this and did not attempt further. Pt directed in gait 20' no AD, min A with VC for decreased step length on RLE and increased on LLE and noted trunk lean to R, unable to correct. Pt then directed in [facility] mobility back to room 200' CGA with VC for technique. Pt then directed in Stand pivot transfer to recliner, left there, alarm set, All needs in reach and in good condition. Call light in hand.  And visitor present. Pt reported 9/10 pain at end of session and nursing made aware.   Therapy Documentation Precautions:  Precautions Precautions: Fall,Other (comment) Precaution Comments: LLE edema/numbness/foot drop, back pain Restrictions Weight Bearing Restrictions:  No RLE Weight Bearing: Weight bearing as tolerated LLE Weight Bearing: Weight bearing as tolerated Other Position/Activity Restrictions: "WBAT with functional AFO" per ortho MD note 2/20 General: PT Amount of Missed Time (min): 10 Minutes PT Missed Treatment Reason: Pain Vital Signs: Therapy Vitals Temp: 99.1 F (37.3 C) Temp Source: Oral Pulse Rate: 82 Resp: 16 BP: 111/67 Patient Position (if appropriate): Sitting Oxygen Therapy SpO2: 94 % O2 Device: Room Air Pain:   Mobility:   Locomotion :    Trunk/Postural Assessment :    Balance:   Exercises:   Other Treatments:      Therapy/Group: Individual Therapy  Barbaraann Faster 02/16/2021, 3:31 PM

## 2021-02-17 LAB — URINALYSIS, ROUTINE W REFLEX MICROSCOPIC
Bilirubin Urine: NEGATIVE
Glucose, UA: NEGATIVE mg/dL
Hgb urine dipstick: NEGATIVE
Ketones, ur: NEGATIVE mg/dL
Leukocytes,Ua: NEGATIVE
Nitrite: NEGATIVE
Protein, ur: NEGATIVE mg/dL
Specific Gravity, Urine: 1.012 (ref 1.005–1.030)
pH: 5 (ref 5.0–8.0)

## 2021-02-17 LAB — CBC WITH DIFFERENTIAL/PLATELET
Abs Immature Granulocytes: 0.06 10*3/uL (ref 0.00–0.07)
Basophils Absolute: 0.1 10*3/uL (ref 0.0–0.1)
Basophils Relative: 0 %
Eosinophils Absolute: 0.3 10*3/uL (ref 0.0–0.5)
Eosinophils Relative: 2 %
HCT: 37.8 % — ABNORMAL LOW (ref 39.0–52.0)
Hemoglobin: 12.6 g/dL — ABNORMAL LOW (ref 13.0–17.0)
Immature Granulocytes: 0 %
Lymphocytes Relative: 15 %
Lymphs Abs: 2 10*3/uL (ref 0.7–4.0)
MCH: 33.2 pg (ref 26.0–34.0)
MCHC: 33.3 g/dL (ref 30.0–36.0)
MCV: 99.7 fL (ref 80.0–100.0)
Monocytes Absolute: 0.9 10*3/uL (ref 0.1–1.0)
Monocytes Relative: 7 %
Neutro Abs: 10.1 10*3/uL — ABNORMAL HIGH (ref 1.7–7.7)
Neutrophils Relative %: 76 %
Platelets: 331 10*3/uL (ref 150–400)
RBC: 3.79 MIL/uL — ABNORMAL LOW (ref 4.22–5.81)
RDW: 11.8 % (ref 11.5–15.5)
WBC: 13.4 10*3/uL — ABNORMAL HIGH (ref 4.0–10.5)
nRBC: 0 % (ref 0.0–0.2)

## 2021-02-17 LAB — BASIC METABOLIC PANEL
Anion gap: 11 (ref 5–15)
BUN: 13 mg/dL (ref 6–20)
CO2: 25 mmol/L (ref 22–32)
Calcium: 9.3 mg/dL (ref 8.9–10.3)
Chloride: 100 mmol/L (ref 98–111)
Creatinine, Ser: 1.2 mg/dL (ref 0.61–1.24)
GFR, Estimated: >60 mL/min (ref >60)
Glucose, Bld: 125 mg/dL — ABNORMAL HIGH (ref 70–99)
Potassium: 4.2 mmol/L (ref 3.5–5.1)
Sodium: 136 mmol/L (ref 135–145)

## 2021-02-17 LAB — CK: Total CK: 2146 U/L — ABNORMAL HIGH (ref 49–397)

## 2021-02-17 LAB — MAGNESIUM: Magnesium: 1.7 mg/dL (ref 1.7–2.4)

## 2021-02-17 NOTE — Progress Notes (Signed)
Physical Therapy Session Note  Patient Details  Name: Shane Guzman MRN: [id number] Date of Birth: [birth year]  Today's Date: 02/17/2021 PT Individual Time: 0910-1005 PT Individual Time Calculation (min): 55 min   Short Term Goals: Week 1:  PT Short Term Goal 1 (Week 1): Pt wil perform supine<>sit with CGA PT Short Term Goal 2 (Week 1): Pt will perform sit<>stand using LRAD with CGA PT Short Term Goal 3 (Week 1): Pt will perform bed<>chair transfers using LRAD with CGA PT Short Term Goal 4 (Week 1): Pt will ambulate at least 132f using LRAD with CGA PT Short Term Goal 5 (Week 1): Pt will ascend/descend 12 steps using HRs with CGA  Skilled Therapeutic Interventions/Progress Updates:   Pt received sitting in recliner and agreeable to PT. RN present completed medicaiton administration. PT assisted pt to don shoes and foot-up brace on the LLE with total A for time management.   [facility] and supervision assist from PT for safety. Throughout treatment pt performed sit<>stand with RW x 5 and x 2 without AD and supervision assist from PT with min cues for UE placement intermittently.   Gait training with RW over unlevel cement sidewalk at entrance to hospital 2 x 1511fwith supervision assist. Cues for symmetry for step length and decreased use of BUE on RW as tolerated.   WC mobility x 17044fver cement side walk with supervision assist from PT with min cues for control of WC on downhill grade.   Nustep reciprocal movement activity tolerance training x 5 min with BUE/BLE. Cues for symmetry of ROM throughout, level 5 resistance.   Patient returned to room and performed stand pivot to recliner with RW and supervision assist. Pt left sitting in recliner with call bell in reach and all needs met.           Therapy Documentation Precautions:  Precautions Precautions: Fall,Other (comment) Precaution Comments: LLE edema/numbness/foot drop, back  pain Restrictions Weight Bearing Restrictions: No RLE Weight Bearing: Weight bearing as tolerated LLE Weight Bearing: Weight bearing as tolerated Other Position/Activity Restrictions: "WBAT with functional AFO" per ortho MD note 2/20 Pain:    8/10 Low back. Pressure. Activity increased RN aware    Therapy/Group: Individual Therapy  AusLorie Phenix3/2022, 10:07 AM

## 2021-02-17 NOTE — Plan of Care (Signed)
  Problem: Consults Goal: RH GENERAL PATIENT EDUCATION Description: See Patient Education module for education specifics. Outcome: Progressing   Problem: RH BOWEL ELIMINATION Goal: RH STG MANAGE BOWEL WITH ASSISTANCE Description: STG Manage Bowel with Mod I Assistance. Outcome: Progressing   Problem: RH BLADDER ELIMINATION Goal: RH STG MANAGE BLADDER WITH ASSISTANCE Description: STG Manage Bladder With Mod I Assistance Outcome: Progressing   Problem: RH SKIN INTEGRITY Goal: RH STG MAINTAIN SKIN INTEGRITY WITH ASSISTANCE Description: STG Maintain Skin Integrity With Mod I Assistance. Outcome: Progressing Goal: RH STG ABLE TO PERFORM INCISION/WOUND CARE W/ASSISTANCE Description: STG Able To Perform Incision/Wound Care With Mod I Assistance. Outcome: Progressing   Problem: RH SAFETY Goal: RH STG ADHERE TO SAFETY PRECAUTIONS W/ASSISTANCE/DEVICE Description: STG Adhere to Safety Precautions With Mod I Assistance/Device. Outcome: Progressing   Problem: RH PAIN MANAGEMENT Goal: RH STG PAIN MANAGED AT OR BELOW PT'S PAIN GOAL Description: <3 on a 0-10 pain scale. Outcome: Progressing   Problem: RH KNOWLEDGE DEFICIT GENERAL Goal: RH STG INCREASE KNOWLEDGE OF SELF CARE AFTER HOSPITALIZATION Description: Patient will demonstrate knowledge of medication management, dietary management, safety awareness with educational materials and handouts provided by staff. Outcome: Progressing

## 2021-02-17 NOTE — Progress Notes (Signed)
Physical Therapy Session Note  Patient Details  Name: Shane Guzman MRN: [id number] Date of Birth: January 16, 1976  Today's Date: 02/17/2021 PT Individual Time: 1005-1059 PT Individual Time Calculation (min): 54 min   Short Term Goals: Week 1:  PT Short Term Goal 1 (Week 1): Pt wil perform supine<>sit with CGA PT Short Term Goal 2 (Week 1): Pt will perform sit<>stand using LRAD with CGA PT Short Term Goal 3 (Week 1): Pt will perform bed<>chair transfers using LRAD with CGA PT Short Term Goal 4 (Week 1): Pt will ambulate at least 140ft using LRAD with CGA PT Short Term Goal 5 (Week 1): Pt will ascend/descend 12 steps using HRs with CGA  Skilled Therapeutic Interventions/Progress Updates:    pt received in recliner and agreeable to therapy. Pt reported having same swelling and pain as he usually has 8/10, most pain in back per pt. Pt then directed in Sit to stand to Rolling walker at supervision with poor technique 2/2 pain. Pt then directed in HHA Stand pivot transfer to WC. Pt requested to do therapy outside if able, nursing agreeable and gave pt pain meds prior. Pt then taken off unit outside total A for time and energy. Pt requested to take a break from walking 2/2 pain and back "stiffness". Pt directed in seated trunk stretches of 3x45s: trunk extension with overhead reach and holds, anterior trunk flexion isometric holds, posterior/anterior pelvic tilts, lateral trunk lean/holds R and L, and slight trunk rotation R and L to pt's comfort. Pt reported improved pain to 6/10 with this. Pt then directed in seated BUE strengthening exercises 2x15 5# arm bar of bicep curls, chest press, overhead press and 2x5 8s front raises and lowering with 5# arm bar. Pt then returned to room in [facility] total A for time and energy. Pt declined ambulation 2/2 pain and fatigue. Pt then directed in Sit to stand and 5' gait to bedside with HHA and supervision for sit>supine. Pt left in bed, All needs in reach and in good  condition. Call light in hand.  And alarm set.   Therapy Documentation Precautions:  Precautions Precautions: Fall,Other (comment) Precaution Comments: LLE edema/numbness/foot drop, back pain Restrictions Weight Bearing Restrictions: No RLE Weight Bearing: Weight bearing as tolerated LLE Weight Bearing: Weight bearing as tolerated Other Position/Activity Restrictions: "WBAT with functional AFO" per ortho MD note 2/20 General:   Vital Signs:   Pain: Pain Assessment Pain Scale: 0-10 Pain Score: 10-Worst pain ever Pain Type: Chronic pain Pain Location: Back Pain Intervention(s): Medication (See eMAR) Mobility:   Locomotion :    Trunk/Postural Assessment :    Balance:   Exercises:   Other Treatments:      Therapy/Group: Individual Therapy  Barbaraann Faster 02/17/2021, 12:09 PM

## 2021-02-17 NOTE — Progress Notes (Signed)
PROGRESS NOTE   Subjective/Complaints:    Pt reports swelling somewhat better, but still has some in backside/posterior hip.  Back pain OK with pain meds.  LBM yesterday.     ROS:  Pt denies SOB, abd pain, CP, N/V/C/D, and vision changes  Objective:   No results found. Recent Labs    02/15/21 0722 02/17/21 0544  WBC 14.1* 13.4*  HGB 12.1* 12.6*  HCT 34.7* 37.8*  PLT 320 331   Recent Labs    02/17/21 0544  NA 136  K 4.2  CL 100  CO2 25  GLUCOSE 125*  BUN 13  CREATININE 1.20  CALCIUM 9.3    Intake/Output Summary (Last 24 hours) at 02/17/2021 0850 Last data filed at 02/17/2021 0700 Gross per 24 hour  Intake 880 ml  Output 2800 ml  Net -1920 ml        Physical Exam: Vital Signs Blood pressure 122/85, pulse 85, temperature 98.5 F (36.9 C), resp. rate 17, height 5\' 5"  (1.651 m), weight 110 kg, SpO2 91 %.   Physical Exam    . General: awake, alert, but sleepy, laying in bed; NAD HENT: conjugate gaze; oropharynx moist CV: regular rate; no JVD Pulmonary: CTA B/L; no W/R/R- good air movement- no fluid overload GI: soft, NT, ND, (+)BS Psychiatric: appropriate, cordial, but short Neurological: Ox3 Ext: has a lot less pitting edema in thighs/hips/ and calves - almost resolved in everywhere except posterior hips/thighs and buttocks Skin: intact Upper extremities 5/5 bilaterally LLE 3/5 throughout, limited by pain and edema. 0/5 DF and PF    Assessment/Plan: 1. Functional deficits which require 3+ hours per day of interdisciplinary therapy in a comprehensive inpatient rehab setting.  Physiatrist is providing close team supervision and 24 hour management of active medical problems listed below.  Physiatrist and rehab team continue to assess barriers to discharge/monitor patient progress toward functional and medical goals  Care Tool:  Bathing    Body parts bathed by patient: Right arm,Left  arm,Chest,Abdomen,Front perineal area,Buttocks,Right upper leg,Left upper leg,Face   Body parts bathed by helper: Left lower leg,Right lower leg     Bathing assist Assist Level: Minimal Assistance - Patient > 75%     Upper Body Dressing/Undressing Upper body dressing   What is the patient wearing?: Pull over shirt    Upper body assist Assist Level: Set up assist    Lower Body Dressing/Undressing Lower body dressing      What is the patient wearing?: Underwear/pull up,Pants     Lower body assist Assist for lower body dressing: Minimal Assistance - Patient > 75%     Toileting Toileting    Toileting assist Assist for toileting: Contact Guard/Touching assist (urine only standing over toilet)     Transfers Chair/bed transfer  Transfers assist     Chair/bed transfer assist level: Contact Guard/Touching assist     Locomotion Ambulation   Ambulation assist      Assist level: Moderate Assistance - Patient 50 - 74% Assistive device: No Device Max distance: 41ft   Walk 10 feet activity   Assist     Assist level: Moderate Assistance - Patient - 50 - 74% Assistive device: No Device  Walk 50 feet activity   Assist Walk 50 feet with 2 turns activity did not occur: Safety/medical concerns         Walk 150 feet activity   Assist Walk 150 feet activity did not occur: Safety/medical concerns         Walk 10 feet on uneven surface  activity   Assist Walk 10 feet on uneven surfaces activity did not occur: Safety/medical concerns         Wheelchair     Assist Will patient use wheelchair at discharge?: No             Wheelchair 50 feet with 2 turns activity    Assist            Wheelchair 150 feet activity     Assist          Blood pressure 122/85, pulse 85, temperature 98.5 F (36.9 C), resp. rate 17, height 5\' 5"  (1.651 m), weight 110 kg, SpO2 91 %.  Medical Problem List and Plan: 1.   Debility secondary to AKI  from combination of volume contraction/pigment nephropathy/acute metabolic encephalopathy from rhabdo.  Cranial CT scan negative             -patient may shower             -ELOS/Goals: modI in 7-8 days  -Continue CIR 2.  Antithrombotics: -DVT/anticoagulation: Continue Subcutaneous heparin             -antiplatelet therapy: N/A 3. Pain Management: Robaxin and oxycodone as needed  2/27- added Gabapentin 300 mg TID for nerve pain and increased oxy to 5-10 mg q4 hours prn; lidoderm wasn't helpful- stopped it for back pain.   3/3- pain controlled- con't regimen with Oxy and Gabapentin 4. Mood: Provide emotional support             -antipsychotic agents: N/A 5. Neuropsych: This patient is capable of making decisions on his own behalf. 6. Skin/Wound Care: Routine skin checks 7. Fluids/Electrolytes/Nutrition: Routine in and outs with follow-up chemistries 8.  Left foot drop/left peroneal nerve compression.  Follow-up outpatient nerve conduction study. Will benefit from an AFO- ordered  3/2- AFO delivered 9.  Leukocytosis question aspiration.  Continue Augmentin x2 more days and stop.  2/27- labs in AM s/p Augmentin  2/28: WBC reviewed- 13.9- stable but trending upward, repeat tomorrow to trend  3/3- Off ABX- WBC still slightly elevated, but no signs of infection and pt denies.  10.  History of alcohol tobacco use.  Provide counseling 11.  Abnormal LFTs/transaminitis due to rhabdo.  Follow-up liver function studies  2/27- Last LFTs in 400s- will recheck Monday am to see downtrend, hopefully  2/28: LFTS reviewed: trending downward, repeat in 1 week.   3/1- down to 100s- much improved 12.  Hypertension.  Lopressor 12.5 mg twice daily.  Monitor with increased mobility  2/27- BP slightly elevated in 140s systolic- pulse 80s- if stays up, will increase Lopressor  3/1- BP 130/89- doing great- con't regimen  3/2- BP well controlled- con't regimen 13.  AKI from combination of volume contraction/rhabdo.   Continue IV fluids x24 more hours.  Cr currently 1.19- resolved, continue to monitor.  Nephrology follow-up  2/27- CK is down to 6000 from 32K on 2/23- max was >50k- - Cr doing well  3/2- Recheck CK tomorrow- and monitor Cr/BUN.   3/3- Cr down to 1.20 and CK is down to 2146, [facility] better- even off IVFs 14.  Morbid obesity.  BMI 40.57.  Dietary follow-up 15.  Constipation.  MiraLAX daily, Senokot twice daily.  Colace twice daily as needed 16.  OSA.  Continue CPAP 17.  History of sigmoid diverticulitis perforation 2012.  Follow-up outpatient 18. Rhabdomyolysis: CK is trending downward, continue to monitor  2/27- per #13  3/1- called renal due to swelling/pitting edema- they agreed that we need to stop IVFs- were stopped- will also give 1 dose of Lasix 40 mg PO.   3/2- give another Lasix 40 mg daily x 2 days- and recheck CK tomorrow.    3/3- 1 more dose of Lasix today- for fluid in tissues.   LOS: 6 days A FACE TO FACE EVALUATION WAS PERFORMED  Megan Lovorn 02/17/2021, 8:50 AM

## 2021-02-17 NOTE — Progress Notes (Signed)
Patient ID: VAHE PIENTA, male   DOB: [birth year], 45 y.o.   MRN: [id number] Gave pt the MD letter for work with the discharge date on it. Pt to give to girlfriend to get to his boss. He reports doing well and making progress in therapies.

## 2021-02-17 NOTE — Progress Notes (Signed)
Occupational Therapy Session Note  Patient Details  Name: Shane Guzman MRN: [id number] Date of Birth: Sep 08, 1976  Today's Date: 02/17/2021 OT Individual Time: 0931-1216 OT Individual Time Calculation (min): 40 min    Short Term Goals: Week 1:  OT Short Term Goal 1 (Week 1): Pt will complete toilet transfers with supervision using the RW and 3:1. OT Short Term Goal 2 (Week 1): Pt will complete LB bathing with use of AE and supervision. OT Short Term Goal 3 (Week 1): Pt will perform LB dressing with min assist using AE PRN. OT Short Term Goal 4 (Week 1): Pt will complete tub'shower transfer with use of the tub bench and min assist.  Skilled Therapeutic Interventions/Progress Updates:    Pt greeted at time of session semi reclined in bed sleeping but easily woken, agreeable to OT session but sleepy. C/o pain in L hip throughout and ice provided at end of session, less reports of pain as mobility increased. Supine > sit CGA and sit> stand Min A, urinated in urinal in standing CGA/Min for standing balance as NT needed urine sample. Stand pivot > wheelchair CGA with RW, set up at sink and LLE elevated throughout for comfort. UB bathing/dressing Set up and LB bathe with Min A, able to stand with unilaeral support on sink for periarea and buttocks. LB dress underwear and shorts Min A as reacher not in room today. Total A for TEDS and socks as he had pain in L hip, attempted figure four but unable today. Discussed AE options for home and verbalized understanding saying he had used reacher and sock aide in previous sessions. Walked short distance to recliner CGA with RW and set up with BLEs elevated, ice on L hip, all needs met and call bell in reach with alarm on.   Therapy Documentation Precautions:  Precautions Precautions: Fall,Other (comment) Precaution Comments: LLE edema/numbness/foot drop, back pain Restrictions Weight Bearing Restrictions: No RLE Weight Bearing: Weight bearing as  tolerated LLE Weight Bearing: Weight bearing as tolerated Other Position/Activity Restrictions: "WBAT with functional AFO" per ortho MD note 2/20     Therapy/Group: Individual Therapy  Viona Gilmore 02/17/2021, 8:51 AM

## 2021-02-18 LAB — URINE CULTURE: Culture: <10000 — AB

## 2021-02-18 MED ORDER — CYCLOBENZAPRINE HCL 10 MG PO TABS
10.0000 mg | ORAL_TABLET | Freq: Three times a day (TID) | ORAL | Status: DC
  Administered 2021-02-18 – 2021-02-28 (×31): 10 mg via ORAL
  Filled 2021-02-18 (×31): qty 1

## 2021-02-18 NOTE — Progress Notes (Signed)
Occupational Therapy Session Note  Patient Details  Name: Shane Guzman MRN: [id number] Date of Birth: [birth year]  Today's Date: 02/18/2021 OT Individual Time: 1100-1158 OT Individual Time Calculation (min): 58 min   Short Term Goals: Week 1:  OT Short Term Goal 1 (Week 1): Pt will complete toilet transfers with supervision using the RW and 3:1. OT Short Term Goal 2 (Week 1): Pt will complete LB bathing with use of AE and supervision. OT Short Term Goal 3 (Week 1): Pt will perform LB dressing with min assist using AE PRN. OT Short Term Goal 4 (Week 1): Pt will complete tub'shower transfer with use of the tub bench and min assist.    Skilled Therapeutic Interventions/Progress Updates:    Pt greeted in the recliner and premedicated for pain. Toileting needs met, pt already dressed and appearing lethargic. We discussed his d/c plans and he stated that he has very limited assist from friend [name]. Session focus was placed on pt independently doffing/donning footwear, as he will need to be wearing the Lt AFO for household mobility/ADL participation. Education provided on using reacher + shoe funnel. Pt able to doff the Lt shoe, including his AFO component using the reacher with vcs. He needed Min A to adjust AFO after he donned the shoe himself using shoe funnel, able to loosely secure straps with the recliner footrest up and while using the reacher. We discussed that his AFO had to be more securely strapped in order for him to be safe during his daily routine. Advised for [name] to help him with LB dressing for the Lt LE in the morning prior to all mobility for safety. Due to pts flat affect, question carryover of education. Education also provided on using adaptive plastic bag method for donning his Rt compression stocking. With the footrest of the recliner elevated again, and with the Lt foot on the floor, pt able to bend forward enough to don his compression stocking with vcs and also his Rt sneaker.  Practiced ambulatory transfer to the TTB in tub shower room after using RW with close supervision-CGA and vcs for transfer technique. Educated pt that [name] would have to be present to assist him with doffing Lt LB clothing, wash Lt LE in shower, and then don Lt footwear post shower. When [name] arrived near end of session, OT reiterated this and she verbalized understanding. Pt transferred back to the recliner at end of session using the RW. Left him with all needs within reach and safety belt fastened.     Therapy Documentation Precautions:  Precautions Precautions: Fall,Other (comment) Precaution Comments: LLE edema/numbness/foot drop, back pain Restrictions Weight Bearing Restrictions: No RLE Weight Bearing: Weight bearing as tolerated LLE Weight Bearing: Weight bearing as tolerated Other Position/Activity Restrictions: "WBAT with functional AFO" per ortho MD note 2/20 ADL: ADL Eating: Independent Where Assessed-Eating: Chair Grooming: Setup Where Assessed-Grooming: Edge of bed Upper Body Bathing: Supervision/safety Where Assessed-Upper Body Bathing: Edge of bed Lower Body Bathing: Moderate assistance Where Assessed-Lower Body Bathing: Edge of bed Upper Body Dressing: Supervision/safety Where Assessed-Upper Body Dressing: Edge of bed Lower Body Dressing: Maximal assistance Where Assessed-Lower Body Dressing: Edge of bed Toileting: Moderate assistance Where Assessed-Toileting: Bedside Commode Toilet Transfer: Moderate assistance Toilet Transfer Method: Stand pivot Toilet Transfer Equipment: Engineer, technical sales Transfer: Not assessed Social research officer, government: Not assessed      Therapy/Group: Individual Therapy  Elric Tirado A Cerissa Zeiger 02/18/2021, 12:41 PM

## 2021-02-18 NOTE — Progress Notes (Signed)
Physical Therapy Session Note  Patient Details  Name: Shane Guzman MRN: [id number] Date of Birth: [birth year]  Today's Date: 02/18/2021 PT Individual Time: 0855-1000 PT Individual Time Calculation (min): 65 min   Short Term Goals: Week 1:  PT Short Term Goal 1 (Week 1): Pt wil perform supine<>sit with CGA PT Short Term Goal 2 (Week 1): Pt will perform sit<>stand using LRAD with CGA PT Short Term Goal 3 (Week 1): Pt will perform bed<>chair transfers using LRAD with CGA PT Short Term Goal 4 (Week 1): Pt will ambulate at least 151ft using LRAD with CGA PT Short Term Goal 5 (Week 1): Pt will ascend/descend 12 steps using HRs with CGA     Skilled Therapeutic Interventions/Progress Updates:    pt received in bed and requesting 10 mins to rest prior to getting up. Pt educated on scheduled PT session times and pt verbalized he understood but that he needed 10 more mins of rest before he could get up. PT left room and came back 10 mins later, pt agreeable to get OOB. Pt then directed in supine>sit with HOB elevated reporting pain levels too hight to attempt from flat bed. Pt reported 10/10 L hip, LLE and back pain nursing made aware and brought medication to pt. Pt directed in Sit to stand to Rolling walker at CGA antalgic pattern noted. Pt then directed in gait to [facility] 8' with Rolling walker supervision. Pt transferred to [facility] and taken to ortho gym total A for time and energy. Pt directed in car transfer from simulated car height for DC, supervision to enter car and CGA to exit car, VC for safety with improved technique to sit on side of seat then stand from seat. Pt then directed in ascending and descending ramp with Rolling walker supervision. Pt then directed in gait training to gym 150' CGA with antalgic gait pattern noted limited 2/2 swelling and pain in LLE and pelvis. Pt required seated rest break 2/2 pain. Pt then agreeable to stair training x12 steps with B handrails at CGA-supervision. Pt then  directed in gait without AD for 45' +45' CGA-light min A for stability though very limited with pain and swelling. Pt then directed in gait with Rolling walker to room 200' CGA with VC for gait pattern. Pt directed in Stand pivot transfer to recliner and left in recliner with alarm belt set. All needs in reach and in good condition. Call light in hand.    Therapy Documentation Precautions:  Precautions Precautions: Fall,Other (comment) Precaution Comments: LLE edema/numbness/foot drop, back pain Restrictions Weight Bearing Restrictions: No RLE Weight Bearing: Weight bearing as tolerated LLE Weight Bearing: Weight bearing as tolerated Other Position/Activity Restrictions: "WBAT with functional AFO" per ortho MD note 2/20 General:   Vital Signs:   Pain: Pain Assessment Pain Scale: 0-10 Pain Score: 6  Pain Location: Hip Pain Orientation: Left Pain Intervention(s): Medication (See eMAR) Mobility:   Locomotion :    Trunk/Postural Assessment :    Balance:   Exercises:   Other Treatments:      Therapy/Group: Individual Therapy  Barbaraann Faster 02/18/2021, 12:14 PM

## 2021-02-18 NOTE — Progress Notes (Signed)
Patient refused CPAP tonight 

## 2021-02-18 NOTE — Progress Notes (Signed)
Physical Therapy Session Note  Patient Details  Name: Shane Guzman MRN: [id number] Date of Birth: [birth year]  Today's Date: 02/18/2021 PT Individual Time: 1345-1410 PT Individual Time Calculation (min): 25 min   Short Term Goals: Week 1:  PT Short Term Goal 1 (Week 1): Pt wil perform supine<>sit with CGA PT Short Term Goal 2 (Week 1): Pt will perform sit<>stand using LRAD with CGA PT Short Term Goal 3 (Week 1): Pt will perform bed<>chair transfers using LRAD with CGA PT Short Term Goal 4 (Week 1): Pt will ambulate at least 135ft using LRAD with CGA PT Short Term Goal 5 (Week 1): Pt will ascend/descend 12 steps using HRs with CGA  Skilled Therapeutic Interventions/Progress Updates:     Pt received seated in recliner and agrees to therapy. Reports 10/10 pain in R leg. PT provides frequent rest breaks during session to manage pain. Stand step transfer to [facility] with CGA and no AD. WC transport to gym for time management. Pt performs NMR for standing balance and activity tolerance, tossing 1kg ball at trampoline and catching rebound. Pt performs 1x10 chest passes, overhead passes, passes from R to L across body, and passes from L to R across body. PT cues for posture and increased symmetry of weight bearing due to pt attempting to offload L leg. WC transport back to room. Stand step transfer back to bed with CGA. Sit to supine with supervision and cues for positioning. Pt left with alarm intact and all needs within reach.  Therapy Documentation Precautions:  Precautions Precautions: Fall,Other (comment) Precaution Comments: LLE edema/numbness/foot drop, back pain Restrictions Weight Bearing Restrictions: No RLE Weight Bearing: Weight bearing as tolerated LLE Weight Bearing: Weight bearing as tolerated Other Position/Activity Restrictions: "WBAT with functional AFO" per ortho MD note 2/20  Therapy/Group: Individual Therapy  Beau Fanny 02/18/2021, 4:04 PM

## 2021-02-18 NOTE — Progress Notes (Signed)
PROGRESS NOTE   Subjective/Complaints:   Pain in posterior thigh-  Muscle spasm complaints in posterior thigh and R low back.  Tight squeezing pain.   Said pain meds don't work.  Said Robaxin a little helpful, but not a lot.      ROS:  Pt denies SOB, abd pain, CP, N/V/C/D, and vision changes   Objective:   No results found. Recent Labs    02/17/21 0544  WBC 13.4*  HGB 12.6*  HCT 37.8*  PLT 331   Recent Labs    02/17/21 0544  NA 136  K 4.2  CL 100  CO2 25  GLUCOSE 125*  BUN 13  CREATININE 1.20  CALCIUM 9.3    Intake/Output Summary (Last 24 hours) at 02/18/2021 1025 Last data filed at 02/18/2021 0700 Gross per 24 hour  Intake 720 ml  Output 2000 ml  Net -1280 ml        Physical Exam: Vital Signs Blood pressure 122/79, pulse 84, temperature 98.4 F (36.9 C), resp. rate 17, height 5\' 5"  (1.651 m), weight 109.6 kg, SpO2 92 %.   Physical Exam    .  General: awake, alert, appropriate, NAD- distant, laying supine in bed HENT: conjugate gaze; oropharynx moist CV: regular rate and rhythm; no JVD Pulmonary: CTA B/L; no W/R/R- good air movement GI: soft, NT, ND, (+)BS Psychiatric: flat, distant Neurological: Ox3 Ext: pitting edema is resolved Skin: intact Upper extremities 5/5 bilaterally LLE 3/5 throughout, limited by pain and edema. 0/5 DF and PF    Assessment/Plan: 1. Functional deficits which require 3+ hours per day of interdisciplinary therapy in a comprehensive inpatient rehab setting.  Physiatrist is providing close team supervision and 24 hour management of active medical problems listed below.  Physiatrist and rehab team continue to assess barriers to discharge/monitor patient progress toward functional and medical goals  Care Tool:  Bathing    Body parts bathed by patient: Right arm,Left arm,Chest,Abdomen,Front perineal area,Buttocks,Right upper leg,Left upper leg,Face    Body parts bathed by helper: Left lower leg,Right lower leg     Bathing assist Assist Level: Minimal Assistance - Patient > 75%     Upper Body Dressing/Undressing Upper body dressing   What is the patient wearing?: Pull over shirt    Upper body assist Assist Level: Set up assist    Lower Body Dressing/Undressing Lower body dressing      What is the patient wearing?: Underwear/pull up,Pants     Lower body assist Assist for lower body dressing: Minimal Assistance - Patient > 75%     Toileting Toileting    Toileting assist Assist for toileting: Contact Guard/Touching assist (urine only standing over toilet)     Transfers Chair/bed transfer  Transfers assist     Chair/bed transfer assist level: Contact Guard/Touching assist     Locomotion Ambulation   Ambulation assist      Assist level: Moderate Assistance - Patient 50 - 74% Assistive device: No Device Max distance: 88ft   Walk 10 feet activity   Assist     Assist level: Moderate Assistance - Patient - 50 - 74% Assistive device: No Device   Walk 50 feet activity  Assist Walk 50 feet with 2 turns activity did not occur: Safety/medical concerns         Walk 150 feet activity   Assist Walk 150 feet activity did not occur: Safety/medical concerns         Walk 10 feet on uneven surface  activity   Assist Walk 10 feet on uneven surfaces activity did not occur: Safety/medical concerns         Wheelchair     Assist Will patient use wheelchair at discharge?: No             Wheelchair 50 feet with 2 turns activity    Assist            Wheelchair 150 feet activity     Assist          Blood pressure 122/79, pulse 84, temperature 98.4 F (36.9 C), resp. rate 17, height 5\' 5"  (1.651 m), weight 109.6 kg, SpO2 92 %.  Medical Problem List and Plan: 1.   Debility secondary to AKI from combination of volume contraction/pigment nephropathy/acute metabolic  encephalopathy from rhabdo.  Cranial CT scan negative             -patient may shower             -ELOS/Goals: modI in 7-8 days  -Continue CIR 2.  Antithrombotics: -DVT/anticoagulation: Continue Subcutaneous heparin             -antiplatelet therapy: N/A 3. Pain Management: Robaxin and oxycodone as needed  2/27- added Gabapentin 300 mg TID for nerve pain and increased oxy to 5-10 mg q4 hours prn; lidoderm wasn't helpful- stopped it for back pain.   3/3- pain controlled- con't regimen with Oxy and Gabapentin  3/4- pt reports pain NOT controlled- wants meds increased- explained if having muscle spasms, neds to treat that, not just increase oxy- changed to flexeril 10 mg TID for muscle tightness 4. Mood: Provide emotional support             -antipsychotic agents: N/A 5. Neuropsych: This patient is capable of making decisions on his own behalf. 6. Skin/Wound Care: Routine skin checks 7. Fluids/Electrolytes/Nutrition: Routine in and outs with follow-up chemistries 8.  Left foot drop/left peroneal nerve compression.  Follow-up outpatient nerve conduction study. Will benefit from an AFO- ordered  3/2- AFO delivered 9.  Leukocytosis question aspiration.  Continue Augmentin x2 more days and stop.  2/27- labs in AM s/p Augmentin  2/28: WBC reviewed- 13.9- stable but trending upward, repeat tomorrow to trend  3/3- Off ABX- WBC still slightly elevated, but no signs of infection and pt denies.  10.  History of alcohol tobacco use.  Provide counseling 11.  Abnormal LFTs/transaminitis due to rhabdo.  Follow-up liver function studies  2/27- Last LFTs in 400s- will recheck Monday am to see downtrend, hopefully  2/28: LFTS reviewed: trending downward, repeat in 1 week.   3/1- down to 100s- much improved 12.  Hypertension.  Lopressor 12.5 mg twice daily.  Monitor with increased mobility  2/27- BP slightly elevated in 140s systolic- pulse 80s- if stays up, will increase Lopressor  3/1- BP 130/89- doing  great- con't regimen  3/4- BP well controlled- con't regimen 13.  AKI from combination of volume contraction/rhabdo.  Continue IV fluids x24 more hours.  Cr currently 1.19- resolved, continue to monitor.  Nephrology follow-up  2/27- CK is down to 6000 from 32K on 2/23- max was >50k- - Cr doing well  3/2- Recheck CK tomorrow-  and monitor Cr/BUN.   3/3- Cr down to 1.20 and CK is down to 2146, [facility] better- even off IVFs  3/4- will recheck Monday 14.  Morbid obesity.  BMI 40.57.  Dietary follow-up 15.  Constipation.  MiraLAX daily, Senokot twice daily.  Colace twice daily as needed 16.  OSA.  Continue CPAP 17.  History of sigmoid diverticulitis perforation 2012.  Follow-up outpatient 18. Rhabdomyolysis: CK is trending downward, continue to monitor  2/27- per #13  3/1- called renal due to swelling/pitting edema- they agreed that we need to stop IVFs- were stopped- will also give 1 dose of Lasix 40 mg PO.   3/2- give another Lasix 40 mg daily x 2 days- and recheck CK tomorrow.    3/3- 1 more dose of Lasix today- for fluid in tissues.   3/4- No more Lasix- edema resolved   LOS: 7 days A FACE TO FACE EVALUATION WAS PERFORMED  Joana Nolton 02/18/2021, 8:12 AM

## 2021-02-18 NOTE — Progress Notes (Signed)
Patient was medicated this shift x2 during this shift, rating his pain 10/10 on pain scale to left thigh radiating to lower legs and back. Abdomen is firm, denies pain/tenderness to this area, last BM on 02/17/21, His f/u assessment on his pain level rating was 8/10 each time. He indicated the medications was" not reaching his pain" level but for a short period of time. His demeanor was flat and distant in communicating his needs. I suggested a heating/ cooling blanket, he turned away with no reply. Continue to monitor,call bell within reach,and bed alarm on.

## 2021-02-19 DIAGNOSIS — G5702 Lesion of sciatic nerve, left lower limb: Secondary | ICD-10-CM

## 2021-02-19 NOTE — Progress Notes (Signed)
PROGRESS NOTE   Subjective/Complaints: Pt has some LLE discomfort , discussed rhado plus probable nerve compression, importance of positioning in bed  ROS:  Pt denies SOB, abd pain, CP, N/V/C/D, and vision changes   Objective:   No results found. Recent Labs    02/17/21 0544  WBC 13.4*  HGB 12.6*  HCT 37.8*  PLT 331   Recent Labs    02/17/21 0544  NA 136  K 4.2  CL 100  CO2 25  GLUCOSE 125*  BUN 13  CREATININE 1.20  CALCIUM 9.3    Intake/Output Summary (Last 24 hours) at 02/19/2021 0748 Last data filed at 02/19/2021 0200 Gross per 24 hour  Intake 480 ml  Output 1150 ml  Net -670 ml        Physical Exam: Vital Signs Blood pressure 132/83, pulse 89, temperature (!) 97.4 F (36.3 C), temperature source Oral, resp. rate 17, height 5\' 5"  (1.651 m), weight 108.6 kg, SpO2 94 %.   Physical Exam  General: No acute distress Mood and affect are appropriate Heart: Regular rate and rhythm no rubs murmurs or extra sounds Lungs: Clear to auscultation, breathing unlabored, no rales or wheezes Abdomen: Positive bowel sounds, soft nontender to palpation, nondistended Extremities: No clubbing, cyanosis, or edema Skin: No evidence of breakdown, no evidence of rash  Skin: intact Upper extremities 5/5 bilaterally RLE 4/5 distal and prox LLE 3/5 throughout, limited by pain and edema. 0/5 DF and PF , has tingling in Left foot with LT plantar and dorsal surfaces   Assessment/Plan: 1. Functional deficits which require 3+ hours per day of interdisciplinary therapy in a comprehensive inpatient rehab setting.  Physiatrist is providing close team supervision and 24 hour management of active medical problems listed below.  Physiatrist and rehab team continue to assess barriers to discharge/monitor patient progress toward functional and medical goals  Care Tool:  Bathing    Body parts bathed by patient: Right arm,Left  arm,Chest,Abdomen,Front perineal area,Buttocks,Right upper leg,Left upper leg,Face   Body parts bathed by helper: Left lower leg,Right lower leg     Bathing assist Assist Level: Minimal Assistance - Patient > 75%     Upper Body Dressing/Undressing Upper body dressing   What is the patient wearing?: Pull over shirt    Upper body assist Assist Level: Set up assist    Lower Body Dressing/Undressing Lower body dressing      What is the patient wearing?: Underwear/pull up,Pants     Lower body assist Assist for lower body dressing: Minimal Assistance - Patient > 75%     Toileting Toileting    Toileting assist Assist for toileting: Contact Guard/Touching assist (urine only standing over toilet)     Transfers Chair/bed transfer  Transfers assist     Chair/bed transfer assist level: Contact Guard/Touching assist     Locomotion Ambulation   Ambulation assist      Assist level: Moderate Assistance - Patient 50 - 74% Assistive device: No Device Max distance: 76ft   Walk 10 feet activity   Assist     Assist level: Moderate Assistance - Patient - 50 - 74% Assistive device: No Device   Walk 50 feet activity  Assist Walk 50 feet with 2 turns activity did not occur: Safety/medical concerns         Walk 150 feet activity   Assist Walk 150 feet activity did not occur: Safety/medical concerns         Walk 10 feet on uneven surface  activity   Assist Walk 10 feet on uneven surfaces activity did not occur: Safety/medical concerns         Wheelchair     Assist Will patient use wheelchair at discharge?: No             Wheelchair 50 feet with 2 turns activity    Assist            Wheelchair 150 feet activity     Assist          Blood pressure 132/83, pulse 89, temperature (!) 97.4 F (36.3 C), temperature source Oral, resp. rate 17, height 5\' 5"  (1.651 m), weight 108.6 kg, SpO2 94 %.  Medical Problem List and  Plan: 1.   Debility secondary to AKI from combination of volume contraction/pigment nephropathy/acute metabolic encephalopathy from rhabdo.  Cranial CT scan negative             -patient may shower             -ELOS/Goals: modI in 7-8 days  -Continue CIR PT, OT 2.  Antithrombotics: -DVT/anticoagulation: Continue Subcutaneous heparin             -antiplatelet therapy: N/A 3. Pain Management: Robaxin and oxycodone as needed  2/27- added Gabapentin 300 mg TID for nerve pain and increased oxy to 5-10 mg q4 hours prn; lidoderm wasn't helpful- stopped it for back pain.   3/3- pain controlled- con't regimen with Oxy and Gabapentin  3/4- pt reports pain NOT controlled- wants meds increased- explained if having muscle spasms, neds to treat that, not just increase oxy- changed to flexeril 10 mg TID for muscle tightness 4. Mood: Provide emotional support             -antipsychotic agents: N/A 5. Neuropsych: This patient is capable of making decisions on his own behalf. 6. Skin/Wound Care: Routine skin checks 7. Fluids/Electrolytes/Nutrition: Routine in and outs with follow-up chemistries 8.  Left foot drop/left peroneal nerve compression.  Follow-up outpatient nerve conduction study. Will benefit from an AFO- ordered  3/2- AFO delivered 9.  Leukocytosis question aspiration.  Continue Augmentin x2 more days and stop.  2/27- labs in AM s/p Augmentin  2/28: WBC reviewed- 13.9- stable but trending upward, repeat tomorrow to trend  3/3- Off ABX- WBC still slightly elevated, but no signs of infection and pt denies.  10.  History of alcohol tobacco use.  Provide counseling- + elevated ethanol level on admit 11.  Abnormal LFTs/transaminitis due to rhabdo.  Follow-up liver function studies  2/27- Last LFTs in 400s- will recheck Monday am to see downtrend, hopefully  2/28: LFTS reviewed: trending downward, repeat in 1 week.   3/1- down to 100s- much improved 12.  Hypertension.  Lopressor 12.5 mg twice daily.   Monitor with increased mobility  2/27- BP slightly elevated in 140s systolic- pulse 80s- if stays up, will increase Lopressor  3/1- BP 130/89- doing great- con't regimen  3/4- BP well controlled- con't regimen 13.  AKI from combination of volume contraction/rhabdo.  Continue IV fluids x24 more hours.  Cr currently 1.19- resolved, continue to monitor.  Nephrology follow-up  2/27- CK is down to 6000 from 32K on 2/23-  max was >50k- - Cr doing well  3/2- Recheck CK tomorrow- and monitor Cr/BUN.   3/3- Cr down to 1.20 and CK is down to 2146, [facility] better- even off IVFs  3/4- will recheck Monday 14.  Morbid obesity.  BMI 40.57.  Dietary follow-up 15.  Constipation.  MiraLAX daily, Senokot twice daily.  Colace twice daily as needed 16.  OSA.  Continue CPAP 17.  History of sigmoid diverticulitis perforation 2012.  Follow-up outpatient 18. Rhabdomyolysis: CK is trending downward, continue to monitor  2/27- per #13  3/1- called renal due to swelling/pitting edema- they agreed that we need to stop IVFs- were stopped- will also give 1 dose of Lasix 40 mg PO.   3/2- give another Lasix 40 mg daily x 2 days- and recheck CK tomorrow.    3/3- 1 more dose of Lasix today- for fluid in tissues.   3/4- No more Lasix- edema resolved 19.  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

## 2021-02-20 NOTE — Progress Notes (Signed)
Pt unsure if he wanted to wear CPAP tonight.  RT told pt to have RN called RT once pt ready for CPAP if he decided to wear it.

## 2021-02-20 NOTE — Progress Notes (Signed)
Occupational Therapy Session Note  Patient Details  Name: Shane Guzman MRN: [id number] Date of Birth: [birth year]  Today's Date: 02/20/2021 OT Individual Time: 3435-6861 OT Individual Time Calculation (min): 58 min   Skilled Therapeutic Interventions/Progress Updates:    Pt greeted in bed, premedicated for pain however c/o sharp pain in his Rt thigh. RN made aware. ADL needs were met, pt agreeable to go outdoors during session for psychosocial health. OT donned his shoes including the Lt AFO for time mgt before pt transferred to the w/c using RW with CGA. At this time pts father arrived. He joined [place] outdoors. Pt completed 3 bouts of ambulation using the RW for CGA during first 2 bouts and close supervision for 3rd bout. He transferred to a community bench as well, doing well with fully backing up to transfer surface and reaching back before sitting. His father stated that he will be supervising pt at home as well as [name]. OT taught pts father how to don the Lt AFO with father exhibiting carryover of understanding given hands on practice. When pt returned to the unit, we showed his father what TTB looked like. Pt completed simulated TTB transfer with close supervision and min cues for technique using RW. Pt able to recall proper shower curtain placement. We also discussed purchase of nonslip treads for the floor. Discussed with father that OT recommends for pt to have supervision during shower transfers and assisting with donning footwear in/out of shower. Pt self propelled the w/c back to his room and assisted with doffing/donning 3 pillowcases before returning to bed. Pt transferred using the RW with supervision assist. OT doffed footwear for time mgt. At end of session pt remained in bed, left with all needs within reach and bed alarm set. Tx focus placed on dynamic balance, general strengthening, and family education.  Therapy Documentation Precautions:  Precautions Precautions: Fall,Other  (comment) Precaution Comments: LLE edema/numbness/foot drop, back pain Restrictions Weight Bearing Restrictions: Yes RLE Weight Bearing: Weight bearing as tolerated LLE Weight Bearing: Weight bearing as tolerated Other Position/Activity Restrictions: "WBAT with functional AFO" per ortho MD note 2/20 Vital Signs: Therapy Vitals Temp: 98.2 F (36.8 C) Pulse Rate: 93 Resp: 18 BP: 114/73 Patient Position (if appropriate): Lying Oxygen Therapy SpO2: 90 % O2 Device: Room Air Pain:   ADL: ADL Eating: Independent Where Assessed-Eating: Chair Grooming: Setup Where Assessed-Grooming: Edge of bed Upper Body Bathing: Supervision/safety Where Assessed-Upper Body Bathing: Edge of bed Lower Body Bathing: Moderate assistance Where Assessed-Lower Body Bathing: Edge of bed Upper Body Dressing: Supervision/safety Where Assessed-Upper Body Dressing: Edge of bed Lower Body Dressing: Maximal assistance Where Assessed-Lower Body Dressing: Edge of bed Toileting: Moderate assistance Where Assessed-Toileting: Bedside Commode Toilet Transfer: Moderate assistance Toilet Transfer Method: Stand pivot Toilet Transfer Equipment: Engineer, technical sales Transfer: Not assessed Social research officer, government: Not assessed      Therapy/Group: Individual Therapy  Shane Guzman A Shane Guzman 02/20/2021, 3:59 PM

## 2021-02-20 NOTE — Progress Notes (Signed)
Physical Therapy Session Note  Patient Details  Name: ANCIL DEWAN MRN: [id number] Date of Birth: 1976-04-13  Today's Date: 02/20/2021 PT Individual Time: 1100-1200 PT Individual Time Calculation (min): 60 min   Short Term Goals: Week 1:  PT Short Term Goal 1 (Week 1): Pt wil perform supine<>sit with CGA PT Short Term Goal 2 (Week 1): Pt will perform sit<>stand using LRAD with CGA PT Short Term Goal 3 (Week 1): Pt will perform bed<>chair transfers using LRAD with CGA PT Short Term Goal 4 (Week 1): Pt will ambulate at least 14ft using LRAD with CGA PT Short Term Goal 5 (Week 1): Pt will ascend/descend 12 steps using HRs with CGA  Skilled Therapeutic Interventions/Progress Updates:     Patient in bed upon PT arrival. Patient alert and agreeable to PT session. Patient reported 6-8/10 L lower extremity pain during session, RN made aware reports patient was pre-medicated. PT provided repositioning, rest breaks, and distraction as pain interventions throughout session.   Therapeutic Activity: Bed Mobility: Patient performed supine to/from sit with supervision-mod I in a flat bed without use of bed rails. Educated on the importance of practicing mobility like home-set up. Patient donned B tennis shoes with Foot-up brace on L with set-up for the R and max A for the L due to decreased motor control and pain. Transfers: Patient performed sit to/from stand x3 with supervision using RW. Provided verbal cues for forward weights shift and equal weight bearing as tolerated.  Gait Training:  Patient ambulated >100 feet x2 using RW with CGA for safety. Ambulated with antalgic gait with decreased weight shift on L and increased use of upper extremity support. Provided verbal cues for looking ahead, erect posture as tolerated, and increased back muscle activation for reduced shoulder elevation and strain during gait.  Patient reported new 6/10 R medial knee pain during ambulation, RN made aware. Resolved  in sitting and only returned with weight bearing during evaluation. Reports no history of R knee pain. No pain on palpation, except at medial joint line, no symptoms with special tests for ACL, PCL, and meniscal tears, noted medial joint laxity with testing. Noted mild genu valgus in standing bilaterally. Patient with increased L lower extremity pain during testing in sitting, focused remained of session on pain management.   Performed soft tissue mobilization to gluteals and piriformis to patient tolerance due to increased pain with palpation. Performed nerve glides for sciatic nerve in sitting 2x10, minimal effect due to seated position, recommend supine nerve glides next session. Patient with poor tolerance throughout and requested to return to his room to rest. Applied moist hot pack to L gluteals at end of session with 6 layers between skin and hot pack for safety. Patient without change in skin or pain at 5 min and 20 min. Offered to apply kinesiotape to gluteals due to significant edema, patient interested and denied adhesive allergy, but reported feeling comfortable and deferred taping at this time. Patient missed 15 min of skilled PT due to pain/fatigue, RN made aware. Will attempt to make-up missed time as able.     Patient in bed at end of session with breaks locked, bed alarm set, and all needs within reach.    Therapy Documentation Precautions:  Precautions Precautions: Fall,Other (comment) Precaution Comments: LLE edema/numbness/foot drop, back pain Restrictions Weight Bearing Restrictions: Yes RLE Weight Bearing: Weight bearing as tolerated LLE Weight Bearing: Weight bearing as tolerated Other Position/Activity Restrictions: "WBAT with functional AFO" per ortho MD note 2/20 General: PT  Amount of Missed Time (min): 15 Minutes PT Missed Treatment Reason: Pain;Patient fatigue   Therapy/Group: Individual Therapy  Johm Pfannenstiel L Arlie Riker PT, DPT  02/20/2021, 4:58 PM

## 2021-02-21 LAB — CBC WITH DIFFERENTIAL/PLATELET
Abs Immature Granulocytes: 0.03 10*3/uL (ref 0.00–0.07)
Basophils Absolute: 0 10*3/uL (ref 0.0–0.1)
Basophils Relative: 1 %
Eosinophils Absolute: 0.3 10*3/uL (ref 0.0–0.5)
Eosinophils Relative: 3 %
HCT: 39.8 % (ref 39.0–52.0)
Hemoglobin: 13.5 g/dL (ref 13.0–17.0)
Immature Granulocytes: 0 %
Lymphocytes Relative: 22 %
Lymphs Abs: 1.6 10*3/uL (ref 0.7–4.0)
MCH: 33.8 pg (ref 26.0–34.0)
MCHC: 33.9 g/dL (ref 30.0–36.0)
MCV: 99.7 fL (ref 80.0–100.0)
Monocytes Absolute: 0.7 10*3/uL (ref 0.1–1.0)
Monocytes Relative: 10 %
Neutro Abs: 4.7 10*3/uL (ref 1.7–7.7)
Neutrophils Relative %: 64 %
Platelets: 309 10*3/uL (ref 150–400)
RBC: 3.99 MIL/uL — ABNORMAL LOW (ref 4.22–5.81)
RDW: 11.3 % — ABNORMAL LOW (ref 11.5–15.5)
WBC: 7.4 10*3/uL (ref 4.0–10.5)
nRBC: 0 % (ref 0.0–0.2)

## 2021-02-21 LAB — COMPREHENSIVE METABOLIC PANEL
ALT: 40 U/L (ref 0–44)
AST: 32 U/L (ref 15–41)
Albumin: 3.2 g/dL — ABNORMAL LOW (ref 3.5–5.0)
Alkaline Phosphatase: 66 U/L (ref 38–126)
Anion gap: 12 (ref 5–15)
BUN: 18 mg/dL (ref 6–20)
CO2: 24 mmol/L (ref 22–32)
Calcium: 9.6 mg/dL (ref 8.9–10.3)
Chloride: 98 mmol/L (ref 98–111)
Creatinine, Ser: 1.17 mg/dL (ref 0.61–1.24)
GFR, Estimated: >60 mL/min (ref >60)
Glucose, Bld: 125 mg/dL — ABNORMAL HIGH (ref 70–99)
Potassium: 3.7 mmol/L (ref 3.5–5.1)
Sodium: 134 mmol/L — ABNORMAL LOW (ref 135–145)
Total Bilirubin: 0.6 mg/dL (ref 0.3–1.2)
Total Protein: 7 g/dL (ref 6.5–8.1)

## 2021-02-21 LAB — CK: Total CK: 1479 U/L — ABNORMAL HIGH (ref 49–397)

## 2021-02-21 NOTE — Progress Notes (Signed)
PROGRESS NOTE   Subjective/Complaints:  Pt reports Back and LLE still painful- not sure if Flexeril has helped muscle tightness in back and leg.  Is emphatic that has swelling in L buttock and R posterior back- also thinks has some in B/L LEs- however I don't agree with the swelling.    ROS:  Pt denies SOB, abd pain, CP, N/V/C/D, and vision changes   Objective:   No results found. Recent Labs    02/21/21 0604  WBC 7.4  HGB 13.5  HCT 39.8  PLT 309   Recent Labs    02/21/21 0604  NA 134*  K 3.7  CL 98  CO2 24  GLUCOSE 125*  BUN 18  CREATININE 1.17  CALCIUM 9.6    Intake/Output Summary (Last 24 hours) at 02/21/2021 1638 Last data filed at 02/21/2021 0758 Gross per 24 hour  Intake 660 ml  Output 2475 ml  Net -1815 ml        Physical Exam: Vital Signs Blood pressure (!) 133/91, pulse 88, temperature 98.3 F (36.8 C), resp. rate 19, height 5\' 5"  (1.651 m), weight 94.2 kg, SpO2 94 %.   Physical Exam  General: awake, alert, appropriate, NAD- short, supine in bed HENT: conjugate gaze; oropharynx moist CV: regular rate and rhythm; no JVD Pulmonary: CTA B/L; no W/R/R- good air movement GI: soft, NT, ND, (+)BS Psychiatric: appropriate, but flat, short in responses Neurological: Ox3 Skin: intact- has no swelling that I could palpate in LEs nor in L buttock or R posterior hip/low back- I'm wondering if he feels muscle tightness? He specifically had no pitting or nonpitting edema I could find and his weight is back down dramatically.  Upper extremities 5/5 bilaterally RLE 4/5 distal and prox LLE 3/5 throughout, limited by pain and edema. 0/5 DF and PF , has tingling in Left foot with LT plantar and dorsal surfaces   Assessment/Plan: 1. Functional deficits which require 3+ hours per day of interdisciplinary therapy in a comprehensive inpatient rehab setting.  Physiatrist is providing close team supervision  and 24 hour management of active medical problems listed below.  Physiatrist and rehab team continue to assess barriers to discharge/monitor patient progress toward functional and medical goals  Care Tool:  Bathing    Body parts bathed by patient: Right arm,Left arm,Chest,Abdomen,Front perineal area,Buttocks,Right upper leg,Left upper leg,Face   Body parts bathed by helper: Left lower leg,Right lower leg     Bathing assist Assist Level: Minimal Assistance - Patient > 75%     Upper Body Dressing/Undressing Upper body dressing   What is the patient wearing?: Pull over shirt    Upper body assist Assist Level: Set up assist    Lower Body Dressing/Undressing Lower body dressing      What is the patient wearing?: Underwear/pull up,Pants     Lower body assist Assist for lower body dressing: Minimal Assistance - Patient > 75%     Toileting Toileting    Toileting assist Assist for toileting: Contact Guard/Touching assist (urine only standing over toilet)     Transfers Chair/bed transfer  Transfers assist     Chair/bed transfer assist level: Contact Guard/Touching assist  Locomotion Ambulation   Ambulation assist      Assist level: Moderate Assistance - Patient 50 - 74% Assistive device: No Device Max distance: 72ft   Walk 10 feet activity   Assist     Assist level: Moderate Assistance - Patient - 50 - 74% Assistive device: No Device   Walk 50 feet activity   Assist Walk 50 feet with 2 turns activity did not occur: Safety/medical concerns         Walk 150 feet activity   Assist Walk 150 feet activity did not occur: Safety/medical concerns         Walk 10 feet on uneven surface  activity   Assist Walk 10 feet on uneven surfaces activity did not occur: Safety/medical concerns         Wheelchair     Assist Will patient use wheelchair at discharge?: No             Wheelchair 50 feet with 2 turns  activity    Assist            Wheelchair 150 feet activity     Assist          Blood pressure (!) 133/91, pulse 88, temperature 98.3 F (36.8 C), resp. rate 19, height 5\' 5"  (1.651 m), weight 94.2 kg, SpO2 94 %.  Medical Problem List and Plan: 1.   Debility secondary to AKI from combination of volume contraction/pigment nephropathy/acute metabolic encephalopathy from rhabdo.  Cranial CT scan negative             -patient may shower             -ELOS/Goals: modI in 7-8 days  -Continue CIR PT, OT 2.  Antithrombotics: -DVT/anticoagulation: Continue Subcutaneous heparin             -antiplatelet therapy: N/A 3. Pain Management: Robaxin and oxycodone as needed  2/27- added Gabapentin 300 mg TID for nerve pain and increased oxy to 5-10 mg q4 hours prn; lidoderm wasn't helpful- stopped it for back pain.   3/3- pain controlled- con't regimen with Oxy and Gabapentin  3/4- pt reports pain NOT controlled- wants meds increased- explained if having muscle spasms, neds to treat that, not just increase oxy- changed to flexeril 10 mg TID for muscle tightness  3/7- pt reports no different with flexeril- however didn't c/o issues over weekend to Dr 5/7.  4. Mood: Provide emotional support             -antipsychotic agents: N/A 5. Neuropsych: This patient is capable of making decisions on his own behalf. 6. Skin/Wound Care: Routine skin checks 7. Fluids/Electrolytes/Nutrition: Routine in and outs with follow-up chemistries 8.  Left foot drop/left peroneal nerve compression.  Follow-up outpatient nerve conduction study. Will benefit from an AFO- ordered  3/2- AFO delivered 9.  Leukocytosis question aspiration.  Continue Augmentin x2 more days and stop.  2/27- labs in AM s/p Augmentin  2/28: WBC reviewed- 13.9- stable but trending upward, repeat tomorrow to trend  3/3- Off ABX- WBC still slightly elevated, but no signs of infection and pt denies.  10.  History of alcohol tobacco  use.  Provide counseling- + elevated ethanol level on admit 11.  Abnormal LFTs/transaminitis due to rhabdo.  Follow-up liver function studies  2/27- Last LFTs in 400s- will recheck Monday am to see downtrend, hopefully  2/28: LFTS reviewed: trending downward, repeat in 1 week.   3/1- down to 100s- much improved 12.  Hypertension.  Lopressor  12.5 mg twice daily.  Monitor with increased mobility  2/27- BP slightly elevated in 140s systolic- pulse 80s- if stays up, will increase Lopressor  3/1- BP 130/89- doing great- con't regimen  3/4- BP well controlled- con't regimen 13.  AKI from combination of volume contraction/rhabdo.  Continue IV fluids x24 more hours.  Cr currently 1.19- resolved, continue to monitor.  Nephrology follow-up  2/27- CK is down to 6000 from 32K on 2/23- max was >50k- - Cr doing well  3/2- Recheck CK tomorrow- and monitor Cr/BUN.   3/3- Cr down to 1.20 and CK is down to 2146, [facility] better- even off IVFs  3/4- will recheck Monday  3/7- Cr 1.17- and BUN 18 14.  Morbid obesity.  BMI 40.57.  Dietary follow-up 15.  Constipation.  MiraLAX daily, Senokot twice daily.  Colace twice daily as needed  3/7- going regularly- con't regimen 16.  OSA.  Continue CPAP 17.  History of sigmoid diverticulitis perforation 2012.  Follow-up outpatient 18. Rhabdomyolysis: CK is trending downward, continue to monitor  2/27- per #13  3/1- called renal due to swelling/pitting edema- they agreed that we need to stop IVFs- were stopped- will also give 1 dose of Lasix 40 mg PO.   3/2- give another Lasix 40 mg daily x 2 days- and recheck CK tomorrow.    3/3- 1 more dose of Lasix today- for fluid in tissues.   3/4- No more Lasix- edema resolved  3/7- CK down to 1,479 - is almost in normal range- Cr looks great and weight is down to 94 kg- don't see a reason to give more Lasix and K+ 3.7 19.  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

## 2021-02-21 NOTE — Progress Notes (Addendum)
Physical Therapy Weekly Progress Note  Patient Details  Name: Shane Guzman MRN: [id number] Date of Birth: [birth year]  Beginning of progress report period: February 12, 2021 End of progress report period: February 21, 2021  Today's Date: 02/21/2021 PT Individual Time: 0800-0901 and 1115-1200 PT Individual Time Calculation (min): 61 min and 45 mins  Patient has met 5 of 5 short term goals.  Pt has improved with PT to met all STG set at eval and currently CGA grossly for mobility. Pt does live alone and requires 12 steps to enter and would require increased level of I for safety at home.   Patient continues to demonstrate the following deficits muscle weakness, decreased cardiorespiratoy endurance and decreased standing balance and decreased balance strategies and therefore will continue to benefit from skilled PT intervention to increase functional independence with mobility.  Patient progressing toward long term goals..  Continue plan of care.  PT Short Term Goals Week 1:  PT Short Term Goal 1 (Week 1): Pt wil perform supine<>sit with CGA PT Short Term Goal 1 - Progress (Week 1): Met PT Short Term Goal 2 (Week 1): Pt will perform sit<>stand using LRAD with CGA PT Short Term Goal 2 - Progress (Week 1): Met PT Short Term Goal 3 (Week 1): Pt will perform bed<>chair transfers using LRAD with CGA PT Short Term Goal 3 - Progress (Week 1): Met PT Short Term Goal 4 (Week 1): Pt will ambulate at least 110f using LRAD with CGA PT Short Term Goal 4 - Progress (Week 1): Met PT Short Term Goal 5 (Week 1): Pt will ascend/descend 12 steps using HRs with CGA PT Short Term Goal 5 - Progress (Week 1): Met Week 2:  PT Short Term Goal 1 (Week 2): Pt will ascend/descend 12 steps using HRs with SBA PT Short Term Goal 2 (Week 2): Pt will ambulate at least 159fusing LRAD with SBA PT Short Term Goal 3 (Week 2): pt to demonstrate mod I functional transfers with LRAD  Skilled Therapeutic Interventions/Progress  Updates:    pt received in bed and agreeable to therapy however requesting to eat breakfast quickly. Pt setup for this and encouraged to sit upright to complete task, pt agreed. Pt then directed in donning shirt in sitting EOB setup A, and max A for B shoes at EOB. Pt then directed in Sit to stand from EOB no AD at SBA and gait training with Rolling walker for 150 at CGLouisaPt directed in ascending/descending stairs with B handrails at SBA with VC for sequencing for improved technique. Pt grossly limited 2/2 pain and continued swelling in pelvis and thigh areas. Pt then directed in standing balance training without AD for horseshoe toss 2x10 [facility] support and then pt directed in retrieving horseshoes from ground [facility] instance of min A for balance righting. Pt directed in gait training without AD for 4841x2 with CGA with VC for gait pattern, noted R trunk lean and decreased stance time on LLE. Pt then directed in gait to [facility]o gym 75[facility] and VC for technique. Pt then directed in car transfer SBA. Pt then directed in gait training back [facility] VC for technique. Pt requested to brush his teeth and fatigued and asked to complete in sitting mod I for this. Pt then returned to bed no AD min A for stability and sit>supine supervision. Pt then noticed he was unable to find his phone,  pt stated he left phone in his bed upon leaving room and forgot to bring it with him. PT assisted pt in searching bed linens and pt transferred to chair for PT to search under bed and under mattress and between bedrails. Pt then transferred back to bed supervision. PT did not see phone nor did pt. PT informed nursing and nursing reported her and NCT to continue with needs for this and assist in finding phone. Pt left in bed, All needs in reach and in good condition. Call light in hand.  And alarm set.   Session 2: pt received in bed and agreeable to therapy. Visitor present. Pt  reporting 5/10 pain in back but denied nursing needs. Pt directed in supine>sit supervision with HOB slightly elevated. Pt directed in Sit to stand with Rolling walker supervision and gait training for 300' CGA with VC for gait pattern, worsened gait pattern noted with fatigue at longer distance but pt able to correct step lengths with VC. Pt benefited from seated rest break after this distance. Pt directed in NuStep for 66mns at L5 for improved BLE reciprocal movement with pt reporting improved pain levels in back/pelvis and for BLE strengthening. Pt reported 11/20 on BORG scale at end of activity. Pt directed in transfer from Nustep at supervision and directed in gait training for 300' CGA with VC for increased step length on RLE and decreased step length on LLE and increased stance time on LLE with good effect. Pt left in supine in bed at end of session, alarm set, All needs in reach and in good condition. Call light in hand.  And visitor x2 present. All denied further needs.   Therapy Documentation Precautions:  Precautions Precautions: Fall,Other (comment) Precaution Comments: LLE edema/numbness/foot drop, back pain Restrictions Weight Bearing Restrictions: Yes RLE Weight Bearing: Weight bearing as tolerated LLE Weight Bearing: Weight bearing as tolerated Other Position/Activity Restrictions: "WBAT with functional AFO" per ortho MD note 2/20 General:   Vital Signs:  Pain: Pain Assessment Pain Scale: 0-10 Pain Score: 6  Pain Type: Acute pain Pain Location: Back Pain Orientation: Left Pain Descriptors / Indicators: Aching Pain Onset: On-going Patients Stated Pain Goal: 3 Pain Intervention(s): Medication (See eMAR) Vision/Perception     Mobility:   Locomotion :    Trunk/Postural Assessment :    Balance:   Exercises:   Other Treatments:     Therapy/Group: Individual Therapy  HJunie Panning3/06/2021, 10:11 AM

## 2021-02-21 NOTE — Progress Notes (Signed)
Occupational Therapy Session Note  Patient Details  Name: MANDEL SEIDEN MRN: [id number] Date of Birth: [birth year]  Today's Date: 02/21/2021 OT Individual Time: 1300-1356 OT Individual Time Calculation (min): 56 min    Skilled Therapeutic Interventions/Progress Updates:    Pt greeted in bed, finishing up with phone call about buying a new phone due to it being missing today. Girlfriend [name] present and leaving at start of session. Pt was agreeable to shower today. Ambulatory transfer to TTB completed using RW (without Lt AFO) and close supervision-CGA, pt with heavy UE reliance on device. He bathed while seated using LH sponge to reach the Lt foot. Dressing was completed EOB at sit<stand level using RW, pt requiring Min A to don a gripper sock on the Lt foot. CGA for dynamic standing balance overall and min vcs for safe walker positioning during transfers. While semi reclined in bed, guided him through UB exercises using 10# dumbbells 10 reps 2 sets each exercise. Solidified OT DME needs for home. Left him with all needs within reach and bed alarm set.   Therapy Documentation Precautions:  Precautions Precautions: Fall,Other (comment) Precaution Comments: LLE edema/numbness/foot drop, back pain Restrictions Weight Bearing Restrictions: Yes RLE Weight Bearing: Weight bearing as tolerated LLE Weight Bearing: Weight bearing as tolerated Other Position/Activity Restrictions: "WBAT with functional AFO" per ortho MD note 2/20 Vital Signs: Therapy Vitals Temp: 98.3 F (36.8 C) Temp Source: Oral Pulse Rate: 89 Resp: 17 BP: 126/84 Patient Position (if appropriate): Lying Oxygen Therapy SpO2: 100 % O2 Device: Room Air Pain: pt received scheduled pain medicine from RN during tx, pt with pain mostly in back and Lt LE   ADL: ADL Eating: Independent Where Assessed-Eating: Chair Grooming: Setup Where Assessed-Grooming: Edge of bed Upper Body Bathing: Supervision/safety Where  Assessed-Upper Body Bathing: Edge of bed Lower Body Bathing: Moderate assistance Where Assessed-Lower Body Bathing: Edge of bed Upper Body Dressing: Supervision/safety Where Assessed-Upper Body Dressing: Edge of bed Lower Body Dressing: Maximal assistance Where Assessed-Lower Body Dressing: Edge of bed Toileting: Moderate assistance Where Assessed-Toileting: Bedside Commode Toilet Transfer: Moderate assistance Toilet Transfer Method: Stand pivot Toilet Transfer Equipment: Animator Transfer: Not assessed Film/video editor: Not assessed      Therapy/Group: Individual Therapy  Rada Zegers A Pattye Meda 02/21/2021, 3:39 PM

## 2021-02-21 NOTE — Progress Notes (Signed)
Occupational Therapy Weekly Progress Note  Patient Details  Name: Shane Guzman MRN: [id number] Date of Birth: 1976/07/07  Beginning of progress report period: 02/12/2021 End of progress report period: 02/21/2021  Patient has met 3 of 4 short term goals.    Pt has made functional progress at time of report. At time of evaluation, pt required Mod-Max A for LB self care, progressing to now requiring supervision-Min/Mod A for LB bathing and LB dressing respectively. He can now complete ambulatory bathroom transfers using RW with CGA, progressing from time of evaluation where he required Mod A for stand pivot transfers. Family training and d/c planning has been ongoing. Continue OT POC.   Patient continues to demonstrate the following deficits: muscle weakness, decreased cardiorespiratoy endurance and decreased standing balance and decreased balance strategies and therefore will continue to benefit from skilled OT intervention to enhance overall performance with BADL.  Patient progressing toward long term goals..  Continue plan of care.  OT Short Term Goals Week 1:  OT Short Term Goal 1 (Week 1): Pt will complete toilet transfers with supervision using the RW and 3:1. OT Short Term Goal 1 - Progress (Week 1): Progressing toward goal OT Short Term Goal 2 (Week 1): Pt will complete LB bathing with use of AE and supervision. OT Short Term Goal 2 - Progress (Week 1): Met OT Short Term Goal 3 (Week 1): Pt will perform LB dressing with min assist using AE PRN. OT Short Term Goal 3 - Progress (Week 1): Met OT Short Term Goal 4 (Week 1): Pt will complete tub'shower transfer with use of the tub bench and min assist. OT Short Term Goal 4 - Progress (Week 1): Met Week 2:  OT Short Term Goal 1 (Week 2): STGs=LTGs set at Mod I overall   Therapy Documentation Precautions:  Precautions Precautions: Fall,Other (comment) Precaution Comments: LLE edema/numbness/foot drop, back pain Restrictions Weight  Bearing Restrictions: Yes RLE Weight Bearing: Weight bearing as tolerated LLE Weight Bearing: Weight bearing as tolerated Other Position/Activity Restrictions: "WBAT with functional AFO" per ortho MD note 2/20 :   Vital Signs: Therapy Vitals Temp: 98.3 F (36.8 C) Temp Source: Oral Pulse Rate: 89 Resp: 17 BP: 126/84 Patient Position (if appropriate): Lying Oxygen Therapy SpO2: 100 % O2 Device: Room Air Pain:   ADL: ADL Eating: Independent Where Assessed-Eating: Chair Grooming: Setup Where Assessed-Grooming: Edge of bed Upper Body Bathing: Supervision/safety Where Assessed-Upper Body Bathing: Edge of bed Lower Body Bathing: Moderate assistance Where Assessed-Lower Body Bathing: Edge of bed Upper Body Dressing: Supervision/safety Where Assessed-Upper Body Dressing: Edge of bed Lower Body Dressing: Maximal assistance Where Assessed-Lower Body Dressing: Edge of bed Toileting: Moderate assistance Where Assessed-Toileting: Bedside Commode Toilet Transfer: Moderate assistance Toilet Transfer Method: Stand pivot Toilet Transfer Equipment: Radiographer, therapeutic: Not assessed Social research officer, government: Not assessed   :     Therapy/Group: Individual Therapy  Meryl Ponder A Kadan Millstein 02/21/2021, 3:29 PM

## 2021-02-21 NOTE — Progress Notes (Signed)
Patient refused CPAP for the night  

## 2021-02-22 NOTE — Progress Notes (Signed)
Occupational Therapy Session Note  Patient Details  Name: Shane Guzman MRN: [id number] Date of Birth: Jul 17, 1976  Today's Date: 02/22/2021 OT Individual Time: 1001-1055 and 1502-1600 OT Individual Time Calculation (min): 54 min and 58 min   Short Term Goals: Week 2:  OT Short Term Goal 1 (Week 2): STGs=LTGs set at Mod I overall   Skilled Therapeutic Interventions/Progress Updates:    Pt greeted at time of session supine in bed resting agreeable to OT session, no pain resting but some discomfort in L hip later in session with mobility, no number given but rest breaks provided PRN. Supine > sit Supervision with bed features/bed rail and donned shoes with toe up brace Mod A before walking CGA to sink. Sat to perform oral hygiene Mod I. Discussed set up of home bathroom as well, does not think a chair will fit in bathroom so will continue to problem solve. Self propel to ADL apartment, focus of session primarily on IADL retraining for cooking tasks retrieving items from cabinets and fridge at various heights/levels with cues for hand and foot placement for stability, performed all with Supervision/CGA. Pt transported to laundry room where he stated he does not have washer/dryer in his apartment, friend Marylene Land is planning to was clothes for him. Walked from ADL apartment > room CGA/close supervision with RW and transferred to bed same manner. Note walker bag given and used throughout IADL retraining. In bed resting with alarm on call bell in reach.   Session 2: Pt greeted at time of session reclined in bed sleeping, easily woken and agreeable to OT session with encouragement. Supine > sit Supervision from flat bed and no rails to simulate home. Walked short distance CGA with RW to wheelchair, self propel > ADL apartment > gym w/ Supervision. In apartment, performed dynamic standing/reaching in various drawers for items to simulate home dresser for clothing retrieval for ADL with CS/CGA for safety and  new environment but no LOB. In gym, 3x20 rebounder with 5lb ball with 1 set tosses, 1 set toss + overhead, 1 set toss + torso twist. 1x20 w/ 15# dumbbell for bicep curl, chest press, overhead press with mirror for form. Once in room, walked to bathroom close supervision and transferred to toilet same manner, CGA for standing after BM for hygiene with no UE support. Walked bathroom > bed CGA and sit > supine Supervision. Alarm on call bell in reach.   Therapy Documentation Precautions:  Precautions Precautions: Fall,Other (comment) Precaution Comments: LLE edema/numbness/foot drop, back pain Restrictions Weight Bearing Restrictions: No RLE Weight Bearing: Weight bearing as tolerated LLE Weight Bearing: Weight bearing as tolerated Other Position/Activity Restrictions: "WBAT with functional AFO" per ortho MD note 2/20       Therapy/Group: Individual Therapy  [name] 02/22/2021, 7:27 AM

## 2021-02-22 NOTE — Consult Note (Signed)
Neuropsychological Consultation   Patient:   Shane Guzman   DOB:   13-Aug-1976  MR Number:  [id number]  Location:  [facility] [address] [id number] [place] [address] Dept: [phone] Loc: [phone]           Date of Service:   02/22/2021  Start Time:   1 PM End Time:   2 PM  Provider/Observer:  Arley Phenix, Psy.D.       Clinical Neuropsychologist       Billing Code/Service: [phone]  Chief Complaint:    Shane Guzman is a 45 year old male with history of hypertension as well as obstructive sleep apnea on CPAP.  Patient also has a history of alcohol/tobacco use, sigmoid diverticulitis with perforation in 2012 marginal medical compliance.  Patient is worked as a Estate agent.  Patient presented on 02/04/2021 after being found down.  He was diaphoretic and foaming at the mouth.  EMS was activated.  Patient did have a pulse but displayed agonal respiratory patterns and hypoxemia.  Patient received Narcan at scene but did have a negative urine drug screen later.  Patient was hypotensive with IV fluids started.  Patient did not require intubation.  Cranial cervical CT scans were conducted with chest x-ray possible aspiration pneumonia.  Alcohol level was 84.  Neurology service is consulted for AKI as well as rhabdo/hyperkalemia.  Patient responded well to IV fluids.  Physical medicine and rehabilitation was consulted and once therapy evaluations were completed the patient was admitted to the rehab unit due to impaired mobility and debility.  Reason for Service:  Patient was referred for neuropsychological consultation due to difficulties with the patient's motivation and activity levels and follow through with therapeutic interventions.  Patient has a lot of complaints about various aspects including his care even though significant gains have been made.  Patient continues to have significant weakness  at the same time he is having trouble motivating for individual therapeutic sessions he has concerns about his discharge date with expressed desires to extend his hospitalization.  Below is the HPI for the current admission.  HPI: Shane Guzman. [place] is a 45 year old right-handed male with history of hypertension as well as OSA on CPAP, tobacco/alcohol use, sigmoid diverticulitis with perforation 2012 and marginal medical compliance..  Per chart review patient lives alone.  Independent prior to admission.  Works as a Estate agent.  1 level home multiple steps to entry.  He does have a close friend who can assist as needed.  Presented 02/04/2021 after being found down, he was diaphoretic and foaming at the mouth.  EMS was activated he had a pulse with agonal respiratory pattern and hypoxemia.  He did receive Narcan.  Patient was hypotensive with IV fluids initiated.  Patient did not require intubation.  Cranial and cervical CT scan.  Chest x-ray showed possible aspiration pneumonia.  Negative CT of abdomen pelvis showed mild bilateral posterior basilar subsegmental atelectasis and/or infiltrates.  Focal wall thickening seen involving proximal sigmoid colon with minimal surrounding inflammatory changes.  It was uncertain if this represented acute diverticulitis or sequela of previous such inflammation.  Admission chemistries potassium 6.3, creatinine 3.22, CK greater than 50,000, lipase 30, amylase 356, troponin I 63-1 96, ammonia level 42, alcohol level 84, urine culture no growth, urine drug screen negative, blood cultures no growth to date, BNP 40.2.  WBC 15,000, elevated LFTs.  Hemoglobin 17.  Echocardiogram with ejection fraction of 60 to  65% grade 1 diastolic dysfunction.  Patient's elevated troponin felt to be related to demand ischemia.  Nephrology services consulted for AKI as well as rhabdo/hyperkalemia.  Renal ultrasound negative for hydronephrosis.  Patient responded well to IV fluids latest creatinine  1.19 and latest CK 13,468.  LFTs have been elevated suspect secondary to rhabdo continue to improve.  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 Horald Chestnut.  Maintained on subcutaneous heparin for DVT prophylaxis.  Patient is completing course of Augmentin for question aspiration pneumonia.  He is tolerating a regular diet.  Physical Medicine & Rehabilitation was consulted to assess candidacy for CIR given impaired mobility.  Current Status:  Patient was slightly inclined in his bed and alert and oriented.  However, he was somewhat lethargic and very flat of affect.  Patient denied significant depression but clearly he is having some issues with motivation.  Patient displaying adequate cognition including effective expressive and receptive language, good memory functions, and adequate attention and concentration levels for therapeutic efforts to be successful.  Patient acknowledged motivation difficulties and reports that today's decline of therapeutic session in the morning was due to the patient just waking up and being very tired reporting that he slept very poorly the night before.  Patient denied any concerns about various aspects of his rehab stay and was given ample opportunities to talk about any concerns or difficulties he is having and discussed ways that we may be able to address these.  Patient did not come up with some of the concerns that he is verbalized to others on the treatment team including concern about continued swelling of his left leg, concerns about balance issues due to his left leg, and practical issues such as the loss of his cell phone on the unit.  Behavioral Observation: Shane Guzman  presents as a 45 y.o.-year-old Right African American Male who appeared his stated age. his dress was  Appropriate and he was Well Groomed and his manners were Appropriate to the situation.  his participation was indicative of Appropriate and Redirectable behaviors.  There were physical disabilities noted.  he displayed an inappropriate level of cooperation and motivation.     Interactions:    Minimal Inattentive  Attention:   abnormal and attention span appeared shorter than expected for age  Memory:   within normal limits; recent and remote memory intact  Visuo-spatial:  not examined  Speech (Volume):  low  Speech:   normal; normal  Thought Process:  Coherent and Relevant  Though Content:  WNL; not suicidal and not homicidal  Orientation:   person, place, time/date and situation  Judgment:   Fair  Planning:   Poor  Affect:    Blunted, Flat and Lethargic  Mood:    Dysphoric  Insight:   Fair  Intelligence:   normal  Substance Use:  There is a documented history of alcohol abuse confirmed by the patient.    Medical History:   Past Medical History:  Diagnosis Date  . Diverticulitis of intestine with abscess   . Heart murmur   . Hypertension   . OSA on CPAP          Patient Active Problem List   Diagnosis Date Noted  . Debility 02/11/2021  . Acute renal failure (ARF) (HCC) 02/04/2021  . Diverticulitis 06/13/2015  . Sigmoid diverticulitis 12/06/2011  . Diverticulitis  of colon with perforation 11/22/2011  . TOBACCO USER 09/04/2009  . DIVERTICULAR DISEASE 11/18/2007  . MURMUR 11/18/2007    Psychiatric History:  No prior psychiatric history but the patient has had a history of alcohol use and was positive for alcohol at admissions.  Family Med/Psych History:  Family History  Problem Relation Age of Onset  . Diverticulitis Maternal Grandmother     Impression/DX:  GEE HABIG is a 45 year old male with history of hypertension as well as obstructive sleep apnea on CPAP.  Patient also has a history of alcohol/tobacco use, sigmoid diverticulitis with  perforation in 2012 marginal medical compliance.  Patient is worked as a Estate agent.  Patient presented on 02/04/2021 after being found down.  He was diaphoretic and foaming at the mouth.  EMS was activated.  Patient did have a pulse but displayed agonal respiratory patterns and hypoxemia.  Patient received Narcan at scene but did have a negative urine drug screen later.  Patient was hypotensive with IV fluids started.  Patient did not require intubation.  Cranial cervical CT scans were conducted with chest x-ray possible aspiration pneumonia.  Alcohol level was 84.  Neurology service is consulted for AKI as well as rhabdo/hyperkalemia.  Patient responded well to IV fluids.  Physical medicine and rehabilitation was consulted and once therapy evaluations were completed the patient was admitted to the rehab unit due to impaired mobility and debility.  Patient was slightly inclined in his bed and alert and oriented.  However, he was somewhat lethargic and very flat of affect.  Patient denied significant depression but clearly he is having some issues with motivation.  Patient displaying adequate cognition including effective expressive and receptive language, good memory functions, and adequate attention and concentration levels for therapeutic efforts to be successful.  Patient acknowledged motivation difficulties and reports that today's decline of therapeutic session in the morning was due to the patient just waking up and being very tired reporting that he slept very poorly the night before.  Patient denied any concerns about various aspects of his rehab stay and was given ample opportunities to talk about any concerns or difficulties he is having and discussed ways that we may be able to address these.  Patient did not come up with some of the concerns that he is verbalized to others on the treatment team including concern about continued swelling of his left leg, concerns about balance issues due to his  left leg, and practical issues such as the loss of his cell phone on the unit.  Disposition/Plan:  Today we worked on coping and adjustment issues in the patient's frustration with his overall status.  Reiterated the need for full effort during therapeutic interventions and the difficulties and long-term vulnerabilities of debility that is not addressed as soon as possible.  Diagnosis:    Debility        Electronically Signed   _______________________ Arley Phenix, Psy.D. Clinical Neuropsychologist

## 2021-02-22 NOTE — Patient Care Conference (Signed)
Inpatient RehabilitationTeam Conference and Plan of Care Update Date: 02/22/2021   Time: 11:31 AM    Patient Name: Shane Guzman      Medical Record Number: [id number]  Date of Birth: 1976/10/21 Sex: Male         Room/Bed: 4M05C/4M05C-01 Payor Info: Payor: BLUE CROSS BLUE [employer]IELD / Plan: BCBS COMM PPO / Product Type: *No Product type* /    Admit Date/Time:  02/11/2021 10:14 PM  Primary Diagnosis:  Debility  Hospital Problems: Principal Problem:   Debility    Expected Discharge Date: Expected Discharge Date: 02/28/21  Team Members Present: Physician leading conference: Dr. Genice Rouge Care Coodinator Present: Kennyth Arnold, RN, BSN, CRRN;Becky Dupree, LCSW Nurse Present: Chana Bode, RN PT Present: Otelia Sergeant, PT OT Present: Earleen Newport, OT PPS Coordinator present : Fae Pippin, SLP     Current Status/Progress Goal Weekly Team Focus  Bowel/Bladder   continent of B/B. last BM-3/5  Will remain continent and regular.  assess q shift and PRN   Swallow/Nutrition/ Hydration             ADL's   Setup for bathing while seated on shower using LH sponge, setup UB dressing, Min-Mod A LB dressing increased assist if he is wearing the Lt AFO, CGA toileting + toilet transfers  Mod I overall  ADL retraining, functional transfers, dynamic balance, pt/family education   Mobility   superivsion  bed mob; SBA transfers; gait CGA-supervision 150'+ with RW; 12 stairs supervision  mod I STS; bed mob I; supervision car transfers; mod I 150' ; 50' home gait supervision; 12 steps supervision  stair training, gait training, strengthening, pain management, bed mob, transfers   Communication             Safety/Cognition/ Behavioral Observations            Pain   pain level 8 of 10 to left leg  pain <3  assess q shift and PRN   Skin   no skinissues  Will remain free of skin breakdown.  assess skin qshift and PRN     Discharge Planning:  Girlfriend has been here and seen pt in  therapies, he is doing well and makking progress but still has pain issues.   Team Discussion: Weight appears to be lowest since hospital admission, Cr level good, added Flexeril 10 mg TID. Continent B/B, pain 8/10, disengaged, flat affect. Discharge home with girlfriend. Patient on target to meet rehab goals: Showering at shower level, set up for upper body ADL's, needs assist with pants, foot up brace, and shoes. Complains of swelling, MD did not notice any this morning. Patient reports upper adductor swelling and groin. Patient has 12 stairs to enter home. Patient walked without foot up brace.  *See Care Plan and progress notes for long and short-term goals.   Revisions to Treatment Plan:  Discontinued Lasix.  Teaching Needs: Family education, medication management, pain management, transfer training, gait training, stair training, balance training, endurance training.  Current Barriers to Discharge: Inaccessible home environment, Decreased caregiver support, Home enviroment access/layout, Lack of/limited family support, Weight, Weight bearing restrictions, Medication compliance and Behavior  Possible Resolutions to Barriers: Continue current medications, provide emotional support.     Medical Summary Current Status: Continent- LBM in last 24 hours- pain 8/10 no matter what,. regardless of meds;  Barriers to Discharge: Behavior;Decreased family/caregiver support;Home enviroment access/layout;Medical stability;Medication compliance;Weight  Barriers to Discharge Comments: SW- going home alone- GF in and out- 12 steps;  convinced in  swelling of LEs feet and thighs- PT/OT and MD hasn't seen it on pt.- has been able to do 12 steps Supervision Possible Resolutions to Barriers/Weekly Focus: no more Lasix for Edema; K+ 3.7- better; CPK much improved- <1500 now; LFTs normalized;  d/c 3/14-   Continued Need for Acute Rehabilitation Level of Care: The patient requires daily medical management by a  physician with specialized training in physical medicine and rehabilitation for the following reasons: Direction of a multidisciplinary physical rehabilitation program to maximize functional independence : Yes Medical management of patient stability for increased activity during participation in an intensive rehabilitation regime.: Yes Analysis of laboratory values and/or radiology reports with any subsequent need for medication adjustment and/or medical intervention. : Yes   I attest that I was present, lead the team conference, and concur with the assessment and plan of the team.   Tennis Must 02/22/2021, 5:01 PM

## 2021-02-22 NOTE — Progress Notes (Signed)
Patient ID: Shane Guzman, male   DOB: 17-Nov-1976, 45 y.o.   MRN: [id number]  Met with pt to discuss team conference progress toward his goals of supervision level and discharge still 3/14. He feels he will be ready then and doesn't; want to leave any earlier. Still upset over his phone but has a new one. Discussed cost of tub bench and co-pay for rolling walker. He wants to know about short term disability, discussed if MD felt he would be disabled for one year could apply for SSD. He will ask MD in am when rounding. Will work on discharge needs.

## 2021-02-22 NOTE — Progress Notes (Signed)
Physical Therapy Session Note  Patient Details  Name: Shane Guzman MRN: [id number] Date of Birth: 1976/12/09  Today's Date: 02/22/2021 PT Individual Time: 7195-9747 PT Individual Time Calculation (min): 11 min   Short Term Goals: Week 1:  PT Short Term Goal 1 (Week 1): Pt wil perform supine<>sit with CGA PT Short Term Goal 1 - Progress (Week 1): Met PT Short Term Goal 2 (Week 1): Pt will perform sit<>stand using LRAD with CGA PT Short Term Goal 2 - Progress (Week 1): Met PT Short Term Goal 3 (Week 1): Pt will perform bed<>chair transfers using LRAD with CGA PT Short Term Goal 3 - Progress (Week 1): Met PT Short Term Goal 4 (Week 1): Pt will ambulate at least 112f using LRAD with CGA PT Short Term Goal 4 - Progress (Week 1): Met PT Short Term Goal 5 (Week 1): Pt will ascend/descend 12 steps using HRs with CGA PT Short Term Goal 5 - Progress (Week 1): Met Week 2:  PT Short Term Goal 1 (Week 2): Pt will ascend/descend 12 steps using HRs with SBA PT Short Term Goal 2 (Week 2): Pt will ambulate at least 1589fusing LRAD with SBA PT Short Term Goal 3 (Week 2): pt to demonstrate mod I functional transfers with LRAD  Skilled Therapeutic Interventions/Progress Updates:    PT received pt in bed sleeping soundly, easily woke to name. Pt reported he was tired and in pain and did not sleep well 2/2 his missing phone. Pt agreeable to sit up and eat breakfast however once PT set this up, pt denied to participate in therapy and requested to return to sleep and be left alone. PT educated pt on importance of participation with therapy and OOB mobility, pt reported he understood however declined to participate. Pt left as found in bed, All needs in reach and in good condition. Call light in hand.  And alarm set.   Therapy Documentation Precautions:  Precautions Precautions: Fall,Other (comment) Precaution Comments: LLE edema/numbness/foot drop, back pain Restrictions Weight Bearing Restrictions:  No RLE Weight Bearing: Weight bearing as tolerated LLE Weight Bearing: Weight bearing as tolerated Other Position/Activity Restrictions: "WBAT with functional AFO" per ortho MD note 2/20 General: PT Amount of Missed Time (min): 49 Minutes PT Missed Treatment Reason: Patient unwilling to participate;Pain;Patient fatigue Vital Signs: Therapy Vitals Temp: 98.5 F (36.9 C) Temp Source: Oral Pulse Rate: 84 Resp: 18 BP: 119/89 Patient Position (if appropriate): Lying Oxygen Therapy SpO2: 94 % O2 Device: Room Air Pain: Pain Assessment Pain Scale: 0-10 Pain Score: 6  Pain Type: Acute pain Pain Location: Back Pain Orientation: Left Pain Descriptors / Indicators: Aching Pain Onset: On-going Pain Intervention(s): Relaxation;Rest Mobility:   Locomotion :    Trunk/Postural Assessment :    Balance:   Exercises:   Other Treatments:      Therapy/Group: Individual Therapy  HaJunie Panning/07/2021, 8:24 AM

## 2021-02-22 NOTE — Progress Notes (Signed)
PROGRESS NOTE   Subjective/Complaints:  Pt reports still has some swelling/fluid in back and posterior thighs Has received AFO for L foot Bowels OK.  Said phone has disappeared- was in the bed when went to therapy? Not sure if bed linens changed?  ROS:  Pt denies SOB, abd pain, CP, N/V/C/D, and vision changes   Objective:   No results found. Recent Labs    02/21/21 0604  WBC 7.4  HGB 13.5  HCT 39.8  PLT 309   Recent Labs    02/21/21 0604  NA 134*  K 3.7  CL 98  CO2 24  GLUCOSE 125*  BUN 18  CREATININE 1.17  CALCIUM 9.6    Intake/Output Summary (Last 24 hours) at 02/22/2021 0901 Last data filed at 02/22/2021 0700 Gross per 24 hour  Intake 240 ml  Output 900 ml  Net -660 ml        Physical Exam: Vital Signs Blood pressure 119/89, pulse 84, temperature 98.5 F (36.9 C), temperature source Oral, resp. rate 18, height 5\' 5"  (1.651 m), weight 94.3 kg, SpO2 94 %.   Physical Exam   General: awake, alert, appropriate, supine in bed, NAD HENT: conjugate gaze; oropharynx moist CV: regular rate and rhythm; no JVD Pulmonary: CTA B/L; no W/R/R- good air movement GI: soft, NT, ND, (+)BS Psychiatric: flat, quiet, cordial Neurological: Ox3 Skin: intact- no fluid palpated specifically / no edema , however could still has trace swelling Upper extremities 5/5 bilaterally RLE 4/5 distal and prox LLE 3/5 throughout, limited by pain and edema. 0/5 DF and PF , has tingling in Left foot with LT plantar and dorsal surfaces   Assessment/Plan: 1. Functional deficits which require 3+ hours per day of interdisciplinary therapy in a comprehensive inpatient rehab setting.  Physiatrist is providing close team supervision and 24 hour management of active medical problems listed below.  Physiatrist and rehab team continue to assess barriers to discharge/monitor patient progress toward functional and medical goals  Care  Tool:  Bathing    Body parts bathed by patient: Right arm,Left arm,Chest,Abdomen,Front perineal area,Buttocks,Right upper leg,Left upper leg,Face,Right lower leg,Left lower leg   Body parts bathed by helper: Left lower leg,Right lower leg     Bathing assist Assist Level: Set up assist (using LH sponge)     Upper Body Dressing/Undressing Upper body dressing   What is the patient wearing?: Pull over shirt    Upper body assist Assist Level: Set up assist    Lower Body Dressing/Undressing Lower body dressing      What is the patient wearing?: Underwear/pull up,Pants     Lower body assist Assist for lower body dressing: Contact Guard/Touching assist     Toileting Toileting    Toileting assist Assist for toileting: Contact Guard/Touching assist (urine only standing over toilet)     Transfers Chair/bed transfer  Transfers assist     Chair/bed transfer assist level: Contact Guard/Touching assist     Locomotion Ambulation   Ambulation assist      Assist level: Moderate Assistance - Patient 50 - 74% Assistive device: No Device Max distance: 78ft   Walk 10 feet activity   Assist  Assist level: Moderate Assistance - Patient - 50 - 74% Assistive device: No Device   Walk 50 feet activity   Assist Walk 50 feet with 2 turns activity did not occur: Safety/medical concerns         Walk 150 feet activity   Assist Walk 150 feet activity did not occur: Safety/medical concerns         Walk 10 feet on uneven surface  activity   Assist Walk 10 feet on uneven surfaces activity did not occur: Safety/medical concerns         Wheelchair     Assist Will patient use wheelchair at discharge?: No             Wheelchair 50 feet with 2 turns activity    Assist            Wheelchair 150 feet activity     Assist          Blood pressure 119/89, pulse 84, temperature 98.5 F (36.9 C), temperature source Oral, resp. rate 18,  height 5\' 5"  (1.651 m), weight 94.3 kg, SpO2 94 %.  Medical Problem List and Plan: 1.   Debility secondary to AKI from combination of volume contraction/pigment nephropathy/acute metabolic encephalopathy from rhabdo.  Cranial CT scan negative             -patient may shower             -ELOS/Goals: modI in 7-8 days  -Continue CIR PT, OT 2.  Antithrombotics: -DVT/anticoagulation: Continue Subcutaneous heparin             -antiplatelet therapy: N/A 3. Pain Management: Robaxin and oxycodone as needed  2/27- added Gabapentin 300 mg TID for nerve pain and increased oxy to 5-10 mg q4 hours prn; lidoderm wasn't helpful- stopped it for back pain.   3/3- pain controlled- con't regimen with Oxy and Gabapentin  3/4- pt reports pain NOT controlled- wants meds increased- explained if having muscle spasms, neds to treat that, not just increase oxy- changed to flexeril 10 mg TID for muscle tightness  3/7- pt reports no different with flexeril- however didn't c/o issues over weekend to Dr 5/7.  4. Mood: Provide emotional support             -antipsychotic agents: N/A 5. Neuropsych: This patient is capable of making decisions on his own behalf. 6. Skin/Wound Care: Routine skin checks 7. Fluids/Electrolytes/Nutrition: Routine in and outs with follow-up chemistries 8.  Left foot drop/left peroneal nerve compression.  Follow-up outpatient nerve conduction study. Will benefit from an AFO- ordered  3/2- AFO delivered  3/8- using AFO during therapy- is helpful per pt- con't AFO 9.  Leukocytosis question aspiration.  Continue Augmentin x2 more days and stop.  2/27- labs in AM s/p Augmentin  2/28: WBC reviewed- 13.9- stable but trending upward, repeat tomorrow to trend  3/3- Off ABX- WBC still slightly elevated, but no signs of infection and pt denies.  10.  History of alcohol tobacco use.  Provide counseling- + elevated ethanol level on admit 11.  Abnormal LFTs/transaminitis due to rhabdo.  Follow-up liver  function studies  2/27- Last LFTs in 400s- will recheck Monday am to see downtrend, hopefully  2/28: LFTS reviewed: trending downward, repeat in 1 week.   3/1- down to 100s- much improved  3/8- LFTs down to normal levels again- con't to monitor 12.  Hypertension.  Lopressor 12.5 mg twice daily.  Monitor with increased mobility  2/27- BP slightly elevated in  140s systolic- pulse 80s- if stays up, will increase Lopressor  3/1- BP 130/89- doing great- con't regimen  3/8- BP well controlled- con't regimen 13.  AKI from combination of volume contraction/rhabdo.  Continue IV fluids x24 more hours.  Cr currently 1.19- resolved, continue to monitor.  Nephrology follow-up  2/27- CK is down to 6000 from 32K on 2/23- max was >50k- - Cr doing well  3/2- Recheck CK tomorrow- and monitor Cr/BUN.   3/3- Cr down to 1.20 and CK is down to 2146, [facility] better- even off IVFs  3/4- will recheck Monday  3/7- Cr 1.17- and BUN 18 14.  Morbid obesity.  BMI 40.57.  Dietary follow-up  3/8- BMI down to 34.60- due to losing fluid- will monitor 15.  Constipation.  MiraLAX daily, Senokot twice daily.  Colace twice daily as needed  3/7- going regularly- con't regimen 16.  OSA.  Continue CPAP 17.  History of sigmoid diverticulitis perforation 2012.  Follow-up outpatient 18. Rhabdomyolysis: CK is trending downward, continue to monitor  2/27- per #13  3/1- called renal due to swelling/pitting edema- they agreed that we need to stop IVFs- were stopped- will also give 1 dose of Lasix 40 mg PO.   3/2- give another Lasix 40 mg daily x 2 days- and recheck CK tomorrow.    3/3- 1 more dose of Lasix today- for fluid in tissues.   3/4- No more Lasix- edema resolved  3/7- CK down to 1,479 - is almost in normal range- Cr looks great and weight is down to 94 kg- don't see a reason to give more Lasix and K+ 3.7  3/8- d/w pt- explained we don't like to give a lot of Lasix for dregs of fluid because it can have kidneys take a hit and  have to replete K+ which is a big pill- pt agreeable.  19.  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

## 2021-02-23 NOTE — Progress Notes (Signed)
Occupational Therapy Session Note  Patient Details  Name: Shane Guzman MRN: [id number] Date of Birth: 02/23/1976  Today's Date: 02/23/2021 OT Individual Time: 7829-5621 and 1300-1353  OT Individual Time Calculation (min): 54 min and 53 min   Short Term Goals: Week 2:  OT Short Term Goal 1 (Week 2): STGs=LTGs set at Mod I overall   Skilled Therapeutic Interventions/Progress Updates:    Pt greeted at time of session supine in bed resting, just finsihed with PT session, agreeable to shower. Bed mob Supervision to EOB and walked to shower CGA/close supervision w/ RW and no shoes/AFO today with close watch on DF, able to lift foot to perform functional mobility and no LOB. Shower transfer CGA with cues on how to transfer in/out with bench and Set up for bathing tasks and did not need LHS today, able to do with figure four. Dried off same manner before walking CGA w/ RW to bed level to dress. Encouraged use of walker bag to gather clothes to decrease need for walking back and forth at home. Close supervision/CGA for LB dressing underwear and shorts able to thread with figure four, verbal cues for dressing LLE first d/t decreased ROM. Provided print out for suction cup grab bar as well for home as he lives in an apartment and likes to briefly stand for showers. Supine in bed resting alarm on call bell in reach.   Session 2: Pt greeted at time of session supine in bed resting with friend [name] present. Discussed CLOF and DC plan for home at beginning of session with both present. Supine > sit Mod I with bed rail and donned TEDS total A and shoes Mod A with toe up brace. Walked room > ADL apartment CGA with RW and showed pt suction cup grab bar for home use, hand out provided earlier this am. Performed 3x30 hits w/ 4# dowel and beach ball for UB strength and core, 2x15 second hold for figure four stretch as well but noted some shooting pain in LLE. RN aware, providing med pass at end of session for 7/10  pain. Pt set up on SCIFIT level 6 for 10 mins total switching directions half way through. Discussed DC planning and going home throughout session. Stand pivot chair > bed close supervision. Doffed shoes including toe up brace seated EOB. Pt in bed supine with alarm on call bell in reach and hand off to nursing.   Therapy Documentation Precautions:  Precautions Precautions: Fall,Other (comment) Precaution Comments: LLE edema/numbness/foot drop, back pain Restrictions Weight Bearing Restrictions: No RLE Weight Bearing: Weight bearing as tolerated LLE Weight Bearing: Weight bearing as tolerated Other Position/Activity Restrictions: "WBAT with functional AFO" per ortho MD note 2/20     Therapy/Group: Individual Therapy  [name] 02/23/2021, 7:26 AM

## 2021-02-23 NOTE — Progress Notes (Signed)
Patient refuses CPAP 

## 2021-02-23 NOTE — Progress Notes (Signed)
PROGRESS NOTE   Subjective/Complaints:  Pt reports doing "a little better"- some improvement.  Muscle spasms are 'here and there".  LBM yesterday.  Kpad helped back pain a little.  Refuses CPAP at night.   ROS:  Pt denies SOB, abd pain, CP, N/V/C/D, and vision changes  Objective:   No results found. Recent Labs    02/21/21 0604  WBC 7.4  HGB 13.5  HCT 39.8  PLT 309   Recent Labs    02/21/21 0604  NA 134*  K 3.7  CL 98  CO2 24  GLUCOSE 125*  BUN 18  CREATININE 1.17  CALCIUM 9.6    Intake/Output Summary (Last 24 hours) at 02/23/2021 9622 Last data filed at 02/23/2021 0743 Gross per 24 hour  Intake 940 ml  Output 500 ml  Net 440 ml        Physical Exam: Vital Signs Blood pressure 108/69, pulse 90, temperature 98.4 F (36.9 C), temperature source Oral, resp. rate 18, height 5\' 5"  (1.651 m), weight 92.3 kg, SpO2 91 %.   Physical Exam    General: awake, alert, appropriate,initially asleep, NAD HENT: conjugate gaze; oropharynx moist CV: regular rate and rhythm; no JVD Pulmonary: CTA B/L; no W/R/R- good air movement GI: soft, NT, ND, (+)BS- normoactive Psychiatric: t distant, flat, and withdrawn Neurological: Ox3- L foot drop hasn't changed Skin: intact- no fluid palpated specifically / no edema , however could still has trace swelling Upper extremities 5/5 bilaterally RLE 4/5 distal and prox LLE 3/5 throughout, limited by pain and edema. 0/5 DF and PF , has tingling in Left foot with LT plantar and dorsal surfaces   Assessment/Plan: 1. Functional deficits which require 3+ hours per day of interdisciplinary therapy in a comprehensive inpatient rehab setting.  Physiatrist is providing close team supervision and 24 hour management of active medical problems listed below.  Physiatrist and rehab team continue to assess barriers to discharge/monitor patient progress toward functional and medical  goals  Care Tool:  Bathing    Body parts bathed by patient: Right arm,Left arm,Chest,Abdomen,Front perineal area,Buttocks,Right upper leg,Left upper leg,Face,Right lower leg,Left lower leg   Body parts bathed by helper: Left lower leg,Right lower leg     Bathing assist Assist Level: Set up assist (using LH sponge)     Upper Body Dressing/Undressing Upper body dressing   What is the patient wearing?: Pull over shirt    Upper body assist Assist Level: Set up assist    Lower Body Dressing/Undressing Lower body dressing      What is the patient wearing?: Underwear/pull up,Pants     Lower body assist Assist for lower body dressing: Contact Guard/Touching assist     Toileting Toileting    Toileting assist Assist for toileting: Contact Guard/Touching assist     Transfers Chair/bed transfer  Transfers assist     Chair/bed transfer assist level: Contact Guard/Touching assist     Locomotion Ambulation   Ambulation assist      Assist level: Moderate Assistance - Patient 50 - 74% Assistive device: No Device Max distance: 24ft   Walk 10 feet activity   Assist     Assist level: Moderate Assistance - Patient -  50 - 74% Assistive device: No Device   Walk 50 feet activity   Assist Walk 50 feet with 2 turns activity did not occur: Safety/medical concerns         Walk 150 feet activity   Assist Walk 150 feet activity did not occur: Safety/medical concerns         Walk 10 feet on uneven surface  activity   Assist Walk 10 feet on uneven surfaces activity did not occur: Safety/medical concerns         Wheelchair     Assist Will patient use wheelchair at discharge?: No             Wheelchair 50 feet with 2 turns activity    Assist            Wheelchair 150 feet activity     Assist          Blood pressure 108/69, pulse 90, temperature 98.4 F (36.9 C), temperature source Oral, resp. rate 18, height 5\' 5"  (1.651  m), weight 92.3 kg, SpO2 91 %.  Medical Problem List and Plan: 1.   Debility secondary to AKI from combination of volume contraction/pigment nephropathy/acute metabolic encephalopathy from rhabdo.  Cranial CT scan negative             -patient may shower             -ELOS/Goals: modI in 7-8 days  -Continue CIR PT, OT 2.  Antithrombotics: -DVT/anticoagulation: Continue Subcutaneous heparin             -antiplatelet therapy: N/A 3. Pain Management: Robaxin and oxycodone as needed  2/27- added Gabapentin 300 mg TID for nerve pain and increased oxy to 5-10 mg q4 hours prn; lidoderm wasn't helpful- stopped it for back pain.   3/3- pain controlled- con't regimen with Oxy and Gabapentin  3/4- pt reports pain NOT controlled- wants meds increased- explained if having muscle spasms, neds to treat that, not just increase oxy- changed to flexeril 10 mg TID for muscle tightness  3/7- pt reports no different with flexeril- however didn't c/o issues over weekend to Dr 5/7.   3/9- admits muscle spasms now are intermittent, so think meds are working some. Kpad slightly helpful- con't regimen 4. Mood: Provide emotional support             -antipsychotic agents: N/A 5. Neuropsych: This patient is capable of making decisions on his own behalf. 6. Skin/Wound Care: Routine skin checks 7. Fluids/Electrolytes/Nutrition: Routine in and outs with follow-up chemistries 8.  Left foot drop/left peroneal nerve compression.  Follow-up outpatient nerve conduction study. Will benefit from an AFO- ordered  3/2- AFO delivered  3/8- using AFO during therapy- is helpful per pt- con't AFO 9.  Leukocytosis question aspiration.  Continue Augmentin x2 more days and stop.  2/27- labs in AM s/p Augmentin  2/28: WBC reviewed- 13.9- stable but trending upward, repeat tomorrow to trend  3/3- Off ABX- WBC still slightly elevated, but no signs of infection and pt denies.  10.  History of alcohol tobacco use.  Provide counseling-  + elevated ethanol level on admit 11.  Abnormal LFTs/transaminitis due to rhabdo.  Follow-up liver function studies  2/27- Last LFTs in 400s- will recheck Monday am to see downtrend, hopefully  2/28: LFTS reviewed: trending downward, repeat in 1 week.   3/1- down to 100s- much improved  3/8- LFTs down to normal levels again- con't to monitor  3/9- since pt has hx of drinking  alcohol- needs to be careful in giving opiates long term.  12.  Hypertension.  Lopressor 12.5 mg twice daily.  Monitor with increased mobility  2/27- BP slightly elevated in 140s systolic- pulse 80s- if stays up, will increase Lopressor  3/9- BP well controlled- con't regimen 13.  AKI from combination of volume contraction/rhabdo.  Continue IV fluids x24 more hours.  Cr currently 1.19- resolved, continue to monitor.  Nephrology follow-up  2/27- CK is down to 6000 from 32K on 2/23- max was >50k- - Cr doing well  3/2- Recheck CK tomorrow- and monitor Cr/BUN.   3/3- Cr down to 1.20 and CK is down to 2146, [facility] better- even off IVFs  3/4- will recheck Monday  3/7- Cr 1.17- and BUN 18  3/9- will check labs Friday since being d/c'd Monday.  14.  Morbid obesity.  BMI 40.57.  Dietary follow-up  3/8- BMI down to 34.60- due to losing fluid- will monitor 15.  Constipation.  MiraLAX daily, Senokot twice daily.  Colace twice daily as needed  3/7- going regularly- con't regimen 16.  OSA.  Continue CPAP  3/9- refusing CPAP nightly.  17.  History of sigmoid diverticulitis perforation 2012.  Follow-up outpatient 18. Rhabdomyolysis: CK is trending downward, continue to monitor  2/27- per #13  3/1- called renal due to swelling/pitting edema- they agreed that we need to stop IVFs- were stopped- will also give 1 dose of Lasix 40 mg PO.   3/2- give another Lasix 40 mg daily x 2 days- and recheck CK tomorrow.    3/3- 1 more dose of Lasix today- for fluid in tissues.   3/4- No more Lasix- edema resolved  3/7- CK down to 1,479 - is almost  in normal range- Cr looks great and weight is down to 94 kg- don't see a reason to give more Lasix and K+ 3.7  3/8- d/w pt- explained we don't like to give a lot of Lasix for dregs of fluid because it can have kidneys take a hit and have to replete K+ which is a big pill- pt agreeable.  19.  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

## 2021-02-23 NOTE — Progress Notes (Signed)
Physical Therapy Session Note  Patient Details  Name: Shane Guzman MRN: [id number] Date of Birth: November 27, 1976  Today's Date: 02/23/2021 PT Individual Time: 0830-0930 PT Individual Time Calculation (min): 60 min   Short Term Goals: Week 1:  PT Short Term Goal 1 (Week 1): Pt wil perform supine<>sit with CGA PT Short Term Goal 1 - Progress (Week 1): Met PT Short Term Goal 2 (Week 1): Pt will perform sit<>stand using LRAD with CGA PT Short Term Goal 2 - Progress (Week 1): Met PT Short Term Goal 3 (Week 1): Pt will perform bed<>chair transfers using LRAD with CGA PT Short Term Goal 3 - Progress (Week 1): Met PT Short Term Goal 4 (Week 1): Pt will ambulate at least 137f using LRAD with CGA PT Short Term Goal 4 - Progress (Week 1): Met PT Short Term Goal 5 (Week 1): Pt will ascend/descend 12 steps using HRs with CGA PT Short Term Goal 5 - Progress (Week 1): Met Week 2:  PT Short Term Goal 1 (Week 2): Pt will ascend/descend 12 steps using HRs with SBA PT Short Term Goal 2 (Week 2): Pt will ambulate at least 1574fusing LRAD with SBA PT Short Term Goal 3 (Week 2): pt to demonstrate mod I functional transfers with LRAD  Skilled Therapeutic Interventions/Progress Updates:    pt received in bed and agreeable to therapy. Pt reported 7/10 pain in LLE, pt reports nursing aware and he had already gotten pain medicine. Pt directed in supine>sit supervision from flat bed. Pt then directed in Sit to stand to Rolling walker at supervision and initiated gait training with Rolling walker for 150' at supervision with foot up brace at pt request reporting he felt this helped him more than without it. Pt continues to demonstrate antalgic gait pattern however greatly improved and minimal cues for gait pattern. Pt directed in seated BLE strengthening exercises 3# 2x20 marches and LAQ. Pt reported pain in LLE however descried it more of muscle tightness with pain radiating down posterior thigh, pt directed in supine  figure 4 piriformis stretching 3x 1m44mand mild manual release at lateral glute with improved pain levels per pt. Pt directed in BERG balance test scoring 54/56. Indicative of lower fall risk for home. Pt directed in gait speed test, with 0.76m62mith walker needed for test, indicative of D/c to home, slight need for intervention for falls, all per walking speed chart. Pt then directed in gait to return to room 150' with Rolling walker at supervision with similar VC. Pt requested to return to bed, supervision to complete. Pt left as such with all needs in reach and in good condition, alarm set.   Therapy Documentation Precautions:  Precautions Precautions: Fall,Other (comment) Precaution Comments: LLE edema/numbness/foot drop, back pain Restrictions Weight Bearing Restrictions: No RLE Weight Bearing: Weight bearing as tolerated LLE Weight Bearing: Weight bearing as tolerated Other Position/Activity Restrictions: "WBAT with functional AFO" per ortho MD note 2/20 General:   Vital Signs:   Pain: Pain Assessment Pain Scale: 0-10 Pain Score: 7  Pain Type: Acute pain Pain Location: Leg Pain Orientation: Left Pain Descriptors / Indicators: Sharp;Shooting Pain Onset: On-going Pain Intervention(s): Medication (See eMAR);Rest;Repositioned (pt reports nursing has already given him medication) Mobility:   Locomotion :    Trunk/Postural Assessment :    Balance: Balance Balance Assessed: Yes Standardized Balance Assessment Standardized Balance Assessment: Berg Balance Test Berg Balance Test Sit to Stand: Able to stand without using hands and stabilize independently Standing Unsupported: Able to  stand safely 2 minutes Sitting with Back Unsupported but Feet Supported on Floor or Stool: Able to sit safely and securely 2 minutes Stand to Sit: Sits safely with minimal use of hands Transfers: Able to transfer safely, minor use of hands Standing Unsupported with Eyes Closed: Able to stand 10  seconds safely Standing Ubsupported with Feet Together: Able to place feet together independently and stand 1 minute safely From Standing, Reach Forward with Outstretched Arm: Can reach confidently >25 cm (10") From Standing Position, Pick up Object from Floor: Able to pick up shoe safely and easily From Standing Position, Turn to Look Behind Over each Shoulder: Looks behind from both sides and weight shifts well Turn 360 Degrees: Able to turn 360 degrees safely one side only in 4 seconds or less Standing Unsupported, Alternately Place Feet on Step/Stool: Able to stand independently and complete 8 steps >20 seconds Standing Unsupported, One Foot in Front: Able to place foot tandem independently and hold 30 seconds Standing on One Leg: Able to lift leg independently and hold > 10 seconds Total Score: 54 Exercises:   Other Treatments:      Therapy/Group: Individual Therapy  Junie Panning 02/23/2021, 10:55 AM

## 2021-02-24 NOTE — Progress Notes (Signed)
Physical Therapy Session Note  Patient Details  Name: Shane Guzman MRN: [id number] Date of Birth: 01-29-1976  Today's Date: 02/24/2021 PT Individual Time: 1000-1058 and 1300-1400 PT Individual Time Calculation (min): 58 min and 60 mins  Short Term Goals: Week 1:  PT Short Term Goal 1 (Week 1): Pt wil perform supine<>sit with CGA PT Short Term Goal 1 - Progress (Week 1): Met PT Short Term Goal 2 (Week 1): Pt will perform sit<>stand using LRAD with CGA PT Short Term Goal 2 - Progress (Week 1): Met PT Short Term Goal 3 (Week 1): Pt will perform bed<>chair transfers using LRAD with CGA PT Short Term Goal 3 - Progress (Week 1): Met PT Short Term Goal 4 (Week 1): Pt will ambulate at least 172f using LRAD with CGA PT Short Term Goal 4 - Progress (Week 1): Met PT Short Term Goal 5 (Week 1): Pt will ascend/descend 12 steps using HRs with CGA PT Short Term Goal 5 - Progress (Week 1): Met Week 2:  PT Short Term Goal 1 (Week 2): Pt will ascend/descend 12 steps using HRs with SBA PT Short Term Goal 2 (Week 2): Pt will ambulate at least 1571fusing LRAD with SBA PT Short Term Goal 3 (Week 2): pt to demonstrate mod I functional transfers with LRAD  Skilled Therapeutic Interventions/Progress Updates:    session 1: pt received in bed and agreeable to therapy. Pt reported 3/10 pain in LLE. Pt reported he feels that swelling has gone down in BLE. Pt then directed in supine>sit mod I. Pt sat EOB to complete donning B TED hose and B shoes supervision but required PT to tie shoes. Pt then directed in gait training with Rolling walker 400' supervision with VC for trunk extension, decreased reliance on BUE and increased weight through BLE and decreased step length on LLE and increased on RLE. Pt then requested rest break in sitting. Pt then directed in NuStep for 8 mins L6 for improved BLE strengthening, increased reciprocal mobility of BLE for improved gait mechanics. Pt reported 12/20 on BORG post activity and  requested rest break. Pt then directed in dynamic standing balance without AD for wide step outs/ lunges in R and L lateral, front and back directions x15 each grossly CGA. Pt then directed in standing without external support for ball toss against wall for 3 mins no LOB noted however pt reported 5/10 pain in L LE distal hamstring area. Pt then had rest break, fair relief noted by pt. Pt then directed in squat to retrieve 9# hand weight from floor with RUE x8 return to standing then squat back to place weight on floor. Pt then directed in gait to return to room 400' supervision with similar VC and with Rolling walker. Pt requested to be left in recliner at end of session. Pt left as such, alarm set, All needs in reach and in good condition. Call light in hand.    Session 2: pt received in recliner and agreeable to therapy. Pt reported 5/10 pain at posterior LLE. Pt directed in Sit to stand from recliner to Rolling walker at supervision and then gait training with Rolling walker for 125' at supervision with VC for increased step length on LLE and decreased on RLE and trunk extension. Pt directed in seated BLE strengthening exercises 4# of marching, LAQ, no weight on BLE and 5# arm bar for overhead press with hip flexion for oblique and core strengthening and then supine on mat table exercises of hip abduction,  adduction, knees to chest, bridges with glute squeeze 2x20. Pt then directed in standing balance training with 5# arm bar for standing D2 PNF pattern for improved balance, decreased fall risk and increased stability with functional movements 2x10 each direction. Pt directed in car transfer at supervision. Pt then directed in gait training to room to retrieve something 125' suepervision with Rolling walker then sat for brief rest break, directed in gait training to gym 150' with Rolling walker at supervision with VC for gait pattern noted above and decreased reliance on BUE.   Pt then directed in ascending and  descending 12 stairs at [facility] with B handrails with VC for step to pattern and sequence. Pt then required seated rest break and directed in gait training to return to room 150' supervision with Rolling walker. Pt left in recliner, alarm set, All needs in reach and in good condition. Call light in hand.  Pt reported 6/10 pain at end of session In posterior LLE.  Therapy Documentation Precautions:  Precautions Precautions: Fall,Other (comment) Precaution Comments: LLE edema/numbness/foot drop, back pain Restrictions Weight Bearing Restrictions: No RLE Weight Bearing: Weight bearing as tolerated LLE Weight Bearing: Weight bearing as tolerated Other Position/Activity Restrictions: "WBAT with functional AFO" per ortho MD note 2/20 General:   Vital Signs:   Pain: Pain Assessment Pain Scale: 0-10 Pain Score: 2  Mobility:   Locomotion :    Trunk/Postural Assessment :    Balance:   Exercises:   Other Treatments:      Therapy/Group: Individual Therapy  Junie Panning 02/24/2021, 12:02 PM

## 2021-02-24 NOTE — Progress Notes (Signed)
PROGRESS NOTE   Subjective/Complaints:  Doesn't like CPAP- had at home, but won't wear.  Insistent has L buttock swelling- we discussed using Lasix or not- agreed to wait on lasix for now since he feels swollen, but not sure it's actually there.   ROS:  Pt denies SOB, abd pain, CP, N/V/C/D, and vision changes  Objective:   No results found. No results for input(s): WBC, HGB, HCT, PLT in the last 72 hours. No results for input(s): NA, K, CL, CO2, GLUCOSE, BUN, CREATININE, CALCIUM in the last 72 hours.  Intake/Output Summary (Last 24 hours) at 02/24/2021 0833 Last data filed at 02/24/2021 0450 Gross per 24 hour  Intake 480 ml  Output 925 ml  Net -445 ml        Physical Exam: Vital Signs Blood pressure 99/80, pulse 87, temperature 98.3 F (36.8 C), temperature source Oral, resp. rate 17, height 5\' 5"  (1.651 m), weight 93 kg, SpO2 96 %.   Physical Exam     General: awake, alert, appropriate, just woke up, laying supine in bed NAD HENT: conjugate gaze; oropharynx moist CV: regular rate and rhythm; no JVD Pulmonary: CTA B/L; no W/R/R- good air movement GI: soft, NT, ND, (+)BS Psychiatric: decreased interaction, distant, hard to get him to answer questions Neurological: Ox3- L foot drop- no change  Skin: intact- no fluid palpated specifically / no edema , however could still has trace swelling Upper extremities 5/5 bilaterally RLE 4/5 distal and prox LLE 3/5 throughout, limited by pain and edema. 0/5 DF and PF , has tingling in Left foot with LT plantar and dorsal surfaces   Assessment/Plan: 1. Functional deficits which require 3+ hours per day of interdisciplinary therapy in a comprehensive inpatient rehab setting.  Physiatrist is providing close team supervision and 24 hour management of active medical problems listed below.  Physiatrist and rehab team continue to assess barriers to discharge/monitor patient  progress toward functional and medical goals  Care Tool:  Bathing    Body parts bathed by patient: Right arm,Left arm,Chest,Abdomen,Front perineal area,Buttocks,Right upper leg,Left upper leg,Face,Right lower leg,Left lower leg   Body parts bathed by helper: Left lower leg,Right lower leg     Bathing assist Assist Level: Set up assist     Upper Body Dressing/Undressing Upper body dressing   What is the patient wearing?: Pull over shirt    Upper body assist Assist Level: Set up assist    Lower Body Dressing/Undressing Lower body dressing      What is the patient wearing?: Underwear/pull up,Pants     Lower body assist Assist for lower body dressing: Contact Guard/Touching assist     Toileting Toileting    Toileting assist Assist for toileting: Contact Guard/Touching assist     Transfers Chair/bed transfer  Transfers assist     Chair/bed transfer assist level: Contact Guard/Touching assist     Locomotion Ambulation   Ambulation assist      Assist level: Moderate Assistance - Patient 50 - 74% Assistive device: No Device Max distance: 67ft   Walk 10 feet activity   Assist     Assist level: Moderate Assistance - Patient - 50 - 74% Assistive device: No  Device   Walk 50 feet activity   Assist Walk 50 feet with 2 turns activity did not occur: Safety/medical concerns         Walk 150 feet activity   Assist Walk 150 feet activity did not occur: Safety/medical concerns         Walk 10 feet on uneven surface  activity   Assist Walk 10 feet on uneven surfaces activity did not occur: Safety/medical concerns         Wheelchair     Assist Will patient use wheelchair at discharge?: No             Wheelchair 50 feet with 2 turns activity    Assist            Wheelchair 150 feet activity     Assist          Blood pressure 99/80, pulse 87, temperature 98.3 F (36.8 C), temperature source Oral, resp. rate 17,  height 5\' 5"  (1.651 m), weight 93 kg, SpO2 96 %.  Medical Problem List and Plan: 1.   Debility secondary to AKI from combination of volume contraction/pigment nephropathy/acute metabolic encephalopathy from rhabdo.  Cranial CT scan negative             -patient may shower             -ELOS/Goals: modI in 7-8 days  -Continue CIR PT, OT 2.  Antithrombotics: -DVT/anticoagulation: Continue Subcutaneous heparin             -antiplatelet therapy: N/A 3. Pain Management: Robaxin and oxycodone as needed  2/27- added Gabapentin 300 mg TID for nerve pain and increased oxy to 5-10 mg q4 hours prn; lidoderm wasn't helpful- stopped it for back pain.   3/3- pain controlled- con't regimen with Oxy and Gabapentin  3/4- pt reports pain NOT controlled- wants meds increased- explained if having muscle spasms, neds to treat that, not just increase oxy- changed to flexeril 10 mg TID for muscle tightness  3/7- pt reports no different with flexeril- however didn't c/o issues over weekend to Dr 5/7.   3/9- admits muscle spasms now are intermittent, so think meds are working some. Kpad slightly helpful- con't regimen  3/10- says still having pain- wants Oxy increased, which I don't feel comfortable doing- will con't regimen 4. Mood: Provide emotional support             -antipsychotic agents: N/A 5. Neuropsych: This patient is capable of making decisions on his own behalf. 6. Skin/Wound Care: Routine skin checks 7. Fluids/Electrolytes/Nutrition: Routine in and outs with follow-up chemistries 8.  Left foot drop/left peroneal nerve compression.  Follow-up outpatient nerve conduction study. Will benefit from an AFO- ordered  3/2- AFO delivered  3/8- using AFO during therapy- is helpful per pt- con't AFO 9.  Leukocytosis question aspiration.  Continue Augmentin x2 more days and stop.  2/27- labs in AM s/p Augmentin  2/28: WBC reviewed- 13.9- stable but trending upward, repeat tomorrow to trend  3/3- Off ABX-  WBC still slightly elevated, but no signs of infection and pt denies.  10.  History of alcohol tobacco use.  Provide counseling- + elevated ethanol level on admit 11.  Abnormal LFTs/transaminitis due to rhabdo.  Follow-up liver function studies  2/27- Last LFTs in 400s- will recheck Monday am to see downtrend, hopefully  2/28: LFTS reviewed: trending downward, repeat in 1 week.   3/1- down to 100s- much improved  3/8- LFTs down to normal levels  again- con't to monitor  3/9- since pt has hx of drinking alcohol- needs to be careful in giving opiates long term.  12.  Hypertension.  Lopressor 12.5 mg twice daily.  Monitor with increased mobility  2/27- BP slightly elevated in 140s systolic- pulse 80s- if stays up, will increase Lopressor  3/9- BP well controlled- con't regimen  3/10- BP 99/80 this AM- unusual for him- will monitor 13.  AKI from combination of volume contraction/rhabdo.  Continue IV fluids x24 more hours.  Cr currently 1.19- resolved, continue to monitor.  Nephrology follow-up  2/27- CK is down to 6000 from 32K on 2/23- max was >50k- - Cr doing well  3/2- Recheck CK tomorrow- and monitor Cr/BUN.   3/3- Cr down to 1.20 and CK is down to 2146, [facility] better- even off IVFs  3/4- will recheck Monday  3/7- Cr 1.17- and BUN 18  3/9- will check labs Friday since being d/c'd Monday.  3/10- labs tomorrow  14.  Morbid obesity.  BMI 40.57.  Dietary follow-up  3/8- BMI down to 34.60- due to losing fluid- will monitor 15.  Constipation.  MiraLAX daily, Senokot twice daily.  Colace twice daily as needed  3/7- going regularly- con't regimen  3/10- LBM yesterday per pt- con't regimen 16.  OSA.  Continue CPAP  3/9- refusing CPAP nightly.  3/10- says will not wear- refused to wear at home as well.   17.  History of sigmoid diverticulitis perforation 2012.  Follow-up outpatient 18. Rhabdomyolysis: CK is trending downward, continue to monitor  2/27- per #13  3/1- called renal due to  swelling/pitting edema- they agreed that we need to stop IVFs- were stopped- will also give 1 dose of Lasix 40 mg PO.   3/2- give another Lasix 40 mg daily x 2 days- and recheck CK tomorrow.    3/3- 1 more dose of Lasix today- for fluid in tissues.   3/4- No more Lasix- edema resolved  3/7- CK down to 1,479 - is almost in normal range- Cr looks great and weight is down to 94 kg- don't see a reason to give more Lasix and K+ 3.7  3/8- d/w pt- explained we don't like to give a lot of Lasix for dregs of fluid because it can have kidneys take a hit and have to replete K+ which is a big pill- pt agreeable.   3/10- discussed Lasix again- decided against it.  19.  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

## 2021-02-24 NOTE — Progress Notes (Signed)
Occupational Therapy Session Note  Patient Details  Name: Shane Guzman MRN: [id number] Date of Birth: [birth year]  Today's Date: 02/24/2021 OT Individual Time: 2481-8590 OT Individual Time Calculation (min): 53 min    Short Term Goals: Week 1:  OT Short Term Goal 1 (Week 1): Pt will complete toilet transfers with supervision using the RW and 3:1. OT Short Term Goal 1 - Progress (Week 1): Progressing toward goal OT Short Term Goal 2 (Week 1): Pt will complete LB bathing with use of AE and supervision. OT Short Term Goal 2 - Progress (Week 1): Met OT Short Term Goal 3 (Week 1): Pt will perform LB dressing with min assist using AE PRN. OT Short Term Goal 3 - Progress (Week 1): Met OT Short Term Goal 4 (Week 1): Pt will complete tub'shower transfer with use of the tub bench and min assist. OT Short Term Goal 4 - Progress (Week 1): Met Week 2:  OT Short Term Goal 1 (Week 2): STGs=LTGs set at Mod I overall   Skilled Therapeutic Interventions/Progress Updates:    Pt greeted at time of session sitting up in recliner sleepy and watching movie, required frequent verbal cues to attend to therapist questions as he was sleepy and watching movie. Extended time to motivate and RN entered at this time providing med pass including pain meds. Pt then shared that he got bad news from work today and that is why he is feeling "down." Emotional support provided. Walked room > shower room > gym with CGA fading to Supervision with RW. Performed TTB transfer Mod I no cues needed and able to clear legs over shower wall. Once in gym, performed BUE there ex with 15# dowel for bicep curl, chest press, overhead press 2x20 each with rest break before walking back to room. Up in recliner alarm on call bell in reach.   Therapy Documentation Precautions:  Precautions Precautions: Fall,Other (comment) Precaution Comments: LLE edema/numbness/foot drop, back pain Restrictions Weight Bearing Restrictions: No RLE Weight  Bearing: Weight bearing as tolerated LLE Weight Bearing: Weight bearing as tolerated Other Position/Activity Restrictions: "WBAT with functional AFO" per ortho MD note 2/20     Therapy/Group: Individual Therapy  Viona Gilmore 02/24/2021, 2:51 PM

## 2021-02-24 NOTE — Progress Notes (Addendum)
Patient ID: Shane Guzman, male   DOB: 1976-09-19, 45 y.o.   MRN: [id number] Met with pt who requested the letter regarding hospitalization and discharge date emailed to his bank. He gave worker person at the bank to e-mail it too. 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 He was agreeable to going to OP will have girlfriend transport him to appointment. Received his rolling walker and tub bench in preparation discharge home Monday.  2:09 PM Have faxed OP referral to [facility]-will contact pt to make follow up appointments.

## 2021-02-24 NOTE — Discharge Summary (Signed)
Occupational Therapy Discharge Summary  Patient Details  Name: Shane Guzman MRN: [id number] Date of Birth: 06/03/1976   Patient has met 7 of 10 long term goals due to improved activity tolerance, improved balance, postural control, ability to compensate for deficits, improved awareness and improved coordination.  Patient to discharge at overall Modified Independent level.  Patient's care partner is independent to provide the necessary physical assistance at discharge.  Pt is Mod I with BADLs using AE at times including LHS for bathing and walker bag to gather and carry items needed. Pt's friend Janace Hoard will be able to help with some IADLs such as laundry and light housework. Pt has been provided with information where to purchase suction cup grab bar and detachable shower head for home.   Pt still requires setup assistance for the shower for safety and also assist for meal prep at home. Both father and friend Janace Hoard have verbalized understanding and have agreed to provide this assistance  Recommendation:  Patient will not need OT services at time of DC as he will be getting OPPT services and referral for work conditioning program to help the pt return to work.   Equipment: TTB  Reasons for discharge: treatment goals met and discharge from hospital  Patient/family agrees with progress made and goals achieved: Yes  OT Discharge Precautions/Restrictions  Precautions Precautions: Fall Precaution Comments: LLE edema/numbness/foot drop, back pain Restrictions Weight Bearing Restrictions: No RLE Weight Bearing: Weight bearing as tolerated LLE Weight Bearing: Weight bearing as tolerated Vital Signs Therapy Vitals Temp: 98.4 F (36.9 C) Temp Source: Oral Pulse Rate: 90 Resp: 16 BP: (!) 121/94 Patient Position (if appropriate): Sitting Oxygen Therapy SpO2: 95 % O2 Device: Room Air Pain Pain Assessment Pain Scale: 0-10 Pain Score: 4  Pain Type: Acute pain Pain Location: Leg Pain  Orientation: Posterior;Left Pain Descriptors / Indicators: Stabbing Pain Onset: On-going Pain Intervention(s): Rest;Relaxation;Repositioned ADL ADL Eating: Independent Where Assessed-Eating: Chair Grooming: Modified independent Where Assessed-Grooming: seated at sink Upper Body Bathing: Modified independent Where Assessed-Upper Body Bathing: shower Lower Body Bathing: Modified independent Where Assessed-Lower Body Bathing: shower Upper Body Dressing: Modified independent (Device) Where Assessed-Upper Body Dressing: Edge of bed Lower Body Dressing: Modified independent Where Assessed-Lower Body Dressing: Edge of bed Toileting: Modified independent Where Assessed-Toileting: toilet Toilet Transfer: Modified independent  Toilet Transfer Method: Ambulating (RW) Science writer: toilet Social research officer, government: Setup assist (RW) Vision Baseline Vision/History: Wears glasses Wears Glasses: At all times Patient Visual Report: No change from baseline Vision Assessment?: No apparent visual deficits Perception  Perception: Within Functional Limits Praxis Praxis: Intact Cognition Overall Cognitive Status: Within Functional Limits for tasks assessed Alert + Oriented x4 Arousal/Alertness: Awake/alert Memory: Appears intact Awareness: Appears intact Safety/Judgment: Appears intact Sensation Sensation Light Touch: Impaired by gross assessment Peripheral sensation comments: reports impaired sensation in L foot compared to R Light Touch Impaired Details: Impaired LLE Proprioception Impaired Details: Impaired LLE Coordination Gross Motor Movements are Fluid and Coordinated: Yes Fine Motor Movements are Fluid and Coordinated: Yes Coordination and Movement Description: BUE coordination [place] [employer]  Motor Motor - Skilled Clinical Observations: mildly uncoordinated d/t hip discomfort but significantly improved from eval Mobility  Transfers Sit to Stand: Independent with  assistive device Stand to Sit: Independent with assistive device  Trunk/Postural Assessment  Cervical Assessment Cervical Assessment: Within Functional Limits Thoracic Assessment Thoracic Assessment: Within Functional Limits Lumbar Assessment Lumbar Assessment: Exceptions to [facility] Postural Control Postural Control: Within Functional Limits  Balance Balance Balance Assessed: Yes Static Sitting Balance Static  Sitting - Balance Support: Feet supported Static Sitting - Level of Assistance: 6: Modified independent (Device/Increase time) Dynamic Sitting Balance Dynamic Sitting - Balance Support: Feet supported;During functional activity Dynamic Sitting - Level of Assistance: 6: Modified independent (Device/Increase time) Dynamic Sitting - Balance Activities: Lateral lean/weight shifting;Reaching for objects;Forward lean/weight shifting Static Standing Balance Static Standing - Balance Support: During functional activity;Left upper extremity supported Static Standing - Level of Assistance: 6: Modified independent (Device/Increase time) Dynamic Standing Balance Dynamic Standing - Balance Support: During functional activity Dynamic Standing - Level of Assistance: 6: Modified independent (Device/Increase time) Dynamic Standing - Balance Activities: Forward lean/weight shifting;Reaching for objects Extremity/Trunk Assessment RUE Assessment RUE Assessment: Within Functional Limits 5/5 MMT grossly LUE Assessment LUE Assessment: Within Functional Limits 5/5 MMT grossly  Viona Gilmore 02/24/2021, 4:51 PM

## 2021-02-25 LAB — CBC WITH DIFFERENTIAL/PLATELET
Abs Immature Granulocytes: 0.03 10*3/uL (ref 0.00–0.07)
Basophils Absolute: 0 10*3/uL (ref 0.0–0.1)
Basophils Relative: 0 %
Eosinophils Absolute: 0.3 10*3/uL (ref 0.0–0.5)
Eosinophils Relative: 4 %
HCT: 39.1 % (ref 39.0–52.0)
Hemoglobin: 13.2 g/dL (ref 13.0–17.0)
Immature Granulocytes: 0 %
Lymphocytes Relative: 23 %
Lymphs Abs: 1.6 10*3/uL (ref 0.7–4.0)
MCH: 33.1 pg (ref 26.0–34.0)
MCHC: 33.8 g/dL (ref 30.0–36.0)
MCV: 98 fL (ref 80.0–100.0)
Monocytes Absolute: 0.6 10*3/uL (ref 0.1–1.0)
Monocytes Relative: 9 %
Neutro Abs: 4.4 10*3/uL (ref 1.7–7.7)
Neutrophils Relative %: 64 %
Platelets: 252 10*3/uL (ref 150–400)
RBC: 3.99 MIL/uL — ABNORMAL LOW (ref 4.22–5.81)
RDW: 11.3 % — ABNORMAL LOW (ref 11.5–15.5)
WBC: 6.9 10*3/uL (ref 4.0–10.5)
nRBC: 0 % (ref 0.0–0.2)

## 2021-02-25 LAB — COMPREHENSIVE METABOLIC PANEL
ALT: 29 U/L (ref 0–44)
AST: 25 U/L (ref 15–41)
Albumin: 3.4 g/dL — ABNORMAL LOW (ref 3.5–5.0)
Alkaline Phosphatase: 64 U/L (ref 38–126)
Anion gap: 10 (ref 5–15)
BUN: 19 mg/dL (ref 6–20)
CO2: 25 mmol/L (ref 22–32)
Calcium: 9.6 mg/dL (ref 8.9–10.3)
Chloride: 100 mmol/L (ref 98–111)
Creatinine, Ser: 1.26 mg/dL — ABNORMAL HIGH (ref 0.61–1.24)
GFR, Estimated: >60 mL/min (ref >60)
Glucose, Bld: 120 mg/dL — ABNORMAL HIGH (ref 70–99)
Potassium: 4.1 mmol/L (ref 3.5–5.1)
Sodium: 135 mmol/L (ref 135–145)
Total Bilirubin: 0.6 mg/dL (ref 0.3–1.2)
Total Protein: 6.8 g/dL (ref 6.5–8.1)

## 2021-02-25 LAB — CK: Total CK: 1089 U/L — ABNORMAL HIGH (ref 49–397)

## 2021-02-25 MED ORDER — GABAPENTIN 300 MG PO CAPS
600.0000 mg | ORAL_CAPSULE | Freq: Two times a day (BID) | ORAL | Status: DC
  Administered 2021-02-25 – 2021-02-26 (×2): 600 mg via ORAL
  Filled 2021-02-25 (×2): qty 2

## 2021-02-25 MED ORDER — POTASSIUM CHLORIDE CRYS ER 20 MEQ PO TBCR
40.0000 meq | EXTENDED_RELEASE_TABLET | Freq: Once | ORAL | Status: AC
  Administered 2021-02-25: 40 meq via ORAL
  Filled 2021-02-25: qty 2

## 2021-02-25 MED ORDER — FUROSEMIDE 40 MG PO TABS
40.0000 mg | ORAL_TABLET | Freq: Once | ORAL | Status: AC
  Administered 2021-02-25: 40 mg via ORAL
  Filled 2021-02-25: qty 1

## 2021-02-25 NOTE — Progress Notes (Signed)
Occupational Therapy Session Note  Patient Details  Name: Shane Guzman MRN: [id number] Date of Birth: 09-Oct-1976  Today's Date: 02/25/2021 OT Individual Time: 4098-1191 OT Individual Time Calculation (min): 56 min   Short Term Goals: Week 2:  OT Short Term Goal 1 (Week 2): STGs=LTGs set at Mod I overall     Skilled Therapeutic Interventions/Progress Updates:    Pt greeted in bed and premedicated for pain. His SO [name] was present for family education today. Discussed pts need for supervision/setup for bathing and supervision for shower transfers for safety. [name] stated that she has already had practice assisting pt with donning his Lt AFO. Pt gathered needed ADL items using his RW and walker bag and then proceeded into the shower, [name] providing supervision assist for TTB transfer. Pt bathed with setup while seated using the LH sponge. Advised pt to sit in the shower at home as he does not have manually installed grab bars, SO [name] verbalized understanding as well. [name] also stated that pt has a relatively high bed so bed was elevated to estimated height as his bed at home. Pt dressed sit<stand from this elevated bed using his RW for standing support. Educated pt to use a chair or toilet at home for dressing to increase his safety during dressing tasks. Also discussed independent purchase of a small step to increase safety with bed transfers. Pt was then provided with an UE HEP for home use, reviewed exercise techniques together using the blue tband x10-20 reps each exercise. Encouraged daily participation in HEP. Pt returned to bed at close of session, all needs within reach and bed alarm set.   Therapy Documentation Precautions:  Precautions Precautions: Fall Precaution Comments: LLE edema/numbness/foot drop, back pain Restrictions Weight Bearing Restrictions: No RLE Weight Bearing: Weight bearing as tolerated LLE Weight Bearing: Weight bearing as tolerated Other Position/Activity  Restrictions: "WBAT with functional AFO" per ortho MD note 2/20 Vital Signs: Therapy Vitals Temp: 98.1 F (36.7 C) Pulse Rate: 94 Resp: 16 BP: 116/83 Patient Position (if appropriate): Lying Oxygen Therapy SpO2: 96 % O2 Device: Room Air ADL: ADL Eating: Independent Where Assessed-Eating: Chair Grooming: Modified independent Where Assessed-Grooming: Edge of bed Upper Body Bathing: Modified independent Where Assessed-Upper Body Bathing: Edge of bed Lower Body Bathing: Modified independent Where Assessed-Lower Body Bathing: Edge of bed Upper Body Dressing: Modified independent (Device) Where Assessed-Upper Body Dressing: Edge of bed Lower Body Dressing: Modified independent Where Assessed-Lower Body Dressing: Edge of bed Toileting: Modified independent Where Assessed-Toileting: Bedside Commode Toilet Transfer: Modified independent Toilet Transfer Method: Proofreader: Animator Transfer: Modified independent Film/video editor: Not assessed      Therapy/Group: Individual Therapy  Alexio Sroka A Lona Six 02/25/2021, 3:28 PM

## 2021-02-25 NOTE — Progress Notes (Signed)
Inpatient Rehabilitation Care Coordinator Discharge Note  The overall goal for the admission was met for:   Discharge location: [place] 24/7 SUPERVISION  Length of Stay: Yes-17 Days  Discharge activity level: Yes-SUPERVISION-MOD/I LEVEL  Home/community participation: Yes  Services provided included: MD, RD, PT, OT, SLP, RN, CM, TR, Pharmacy, Neuropsych and SW  Financial Services: Private Insurance: [place] offered to/list presented to:YES  Follow-up services arranged: Outpatient: [facility]- WILL CALL TO SET UP APPOINTMENTS, DME: [facility]-[employer] and Patient/Family has no preference for HH/DME agencies. PT DECLINED SUBSTANCE ABUSE RESOURCES  Comments (or additional information):PT DID WELL AND GIRLFRIEND WILL BE THERE TO ASSIST HIM FOR A SHORT TIME  Patient/Family verbalized understanding of follow-up arrangements: Yes  Individual responsible for coordination of the follow-up plan: [id number]  Confirmed correct DME delivered: Elease Hashimoto 02/25/2021    Shane Guzman, Gardiner Rhyme

## 2021-02-25 NOTE — Discharge Instructions (Signed)
Inpatient Rehab Discharge Instructions  Shane Guzman Discharge date and time: No discharge date for patient encounter.   Activities/Precautions/ Functional Status: Activity: As tolerated Diet: Regular Wound Care: Routine skin checks Functional status:  ___ No restrictions     ___ Walk up steps independently ___ 24/7 supervision/assistance   ___ Walk up steps with assistance ___ Intermittent supervision/assistance  ___ Bathe/dress independently ___ Walk with walker     _x__ Bathe/dress with assistance ___ Walk Independently    ___ Shower independently ___ Walk with assistance    ___ Shower with assistance ___ No alcohol     ___ Return to work/school ________  Special Instructions: No driving smoking or alcohol    COMMUNITY REFERRALS UPON DISCHARGE:    Outpatient: PT - WORK [facility]ARDEN[facility]             Agency:[facility] Phone:[phone]                            [address] [place] [address]             Appointment Date/Time:WILL CALL YOU REGARDING APPOINTMENTS  Medical Equipment/Items Ordered:ROLLING WALKER & TUB BENCH                                                 Agency/Supplier:[facility]  [phone]  My questions have been answered and I understand these instructions. I will adhere to these goals and the provided educational materials after my discharge from the hospital.  Patient/Caregiver Signature _______________________________ Date __________  Clinician Signature _______________________________________ Date __________  Please bring this form and your medication list with you to all your follow-up doctor's appointments.

## 2021-02-25 NOTE — Plan of Care (Signed)
  Problem: Consults Goal: RH GENERAL PATIENT EDUCATION Description: See Patient Education module for education specifics. Outcome: Progressing   Problem: RH BOWEL ELIMINATION Goal: RH STG MANAGE BOWEL WITH ASSISTANCE Description: STG Manage Bowel with Mod I Assistance. Outcome: Progressing   Problem: RH BLADDER ELIMINATION Goal: RH STG MANAGE BLADDER WITH ASSISTANCE Description: STG Manage Bladder With Mod I Assistance Outcome: Progressing   Problem: RH SKIN INTEGRITY Goal: RH STG MAINTAIN SKIN INTEGRITY WITH ASSISTANCE Description: STG Maintain Skin Integrity With Mod I Assistance. Outcome: Progressing   Problem: RH SAFETY Goal: RH STG ADHERE TO SAFETY PRECAUTIONS W/ASSISTANCE/DEVICE Description: STG Adhere to Safety Precautions With Mod I Assistance/Device. Outcome: Progressing   Problem: RH PAIN MANAGEMENT Goal: RH STG PAIN MANAGED AT OR BELOW PT'S PAIN GOAL Description: <3 on a 0-10 pain scale. Outcome: Progressing   Problem: RH KNOWLEDGE DEFICIT GENERAL Goal: RH STG INCREASE KNOWLEDGE OF SELF CARE AFTER HOSPITALIZATION Description: Patient will demonstrate knowledge of medication management, dietary management, safety awareness with educational materials and handouts provided by staff. Outcome: Progressing   Problem: Consults Goal: RH GENERAL PATIENT EDUCATION Description: See Patient Education module for education specifics. Outcome: Progressing   

## 2021-02-25 NOTE — Progress Notes (Signed)
PROGRESS NOTE   Subjective/Complaints:  Pt reports LBM yesterday- no issues with it.  Still having pain and weakness in LLE and swelling in L buttock and thigh- we agreed to do Lasix 40 mg x1 one more time- will also give Kdur with it, to reduce risk of hypokalemia.   ROS:  Pt denies SOB, abd pain, CP, N/V/C/D, and vision changes  Objective:   No results found. Recent Labs    02/25/21 0544  WBC 6.9  HGB 13.2  HCT 39.1  PLT 252   Recent Labs    02/25/21 0544  NA 135  K 4.1  CL 100  CO2 25  GLUCOSE 120*  BUN 19  CREATININE 1.26*  CALCIUM 9.6    Intake/Output Summary (Last 24 hours) at 02/25/2021 0840 Last data filed at 02/25/2021 0700 Gross per 24 hour  Intake 702 ml  Output -  Net 702 ml        Physical Exam: Vital Signs Blood pressure 113/71, pulse 88, temperature 98.5 F (36.9 C), resp. rate 18, height 5\' 5"  (1.651 m), weight 93 kg, SpO2 96 %.   Physical Exam      General: awake, alert, appropriate,  Sleepy, but finished breakfast, NAD HENT: conjugate gaze; oropharynx moist CV: regular rate and rhythm; no JVD Pulmonary: CTA B/L; no W/R/R- good air movement GI: soft, NT, ND, (+)BS Psychiatric: distant, short with me/staff Neurological: Ox3 L foot drop- no change Skin: intact- no fluid palpated specifically / no edema , however could still has trace swelling Upper extremities 5/5 bilaterally RLE 4/5 distal and prox LLE 3/5 throughout, limited by pain and edema. 0/5 DF and PF , has tingling in Left foot with LT plantar and dorsal surfaces   Assessment/Plan: 1. Functional deficits which require 3+ hours per day of interdisciplinary therapy in a comprehensive inpatient rehab setting.  Physiatrist is providing close team supervision and 24 hour management of active medical problems listed below.  Physiatrist and rehab team continue to assess barriers to discharge/monitor patient progress  toward functional and medical goals  Care Tool:  Bathing    Body parts bathed by patient: Right arm,Left arm,Chest,Abdomen,Front perineal area,Buttocks,Right upper leg,Left upper leg,Face,Right lower leg,Left lower leg   Body parts bathed by helper: Left lower leg,Right lower leg     Bathing assist Assist Level: Set up assist     Upper Body Dressing/Undressing Upper body dressing   What is the patient wearing?: Pull over shirt    Upper body assist Assist Level: Set up assist    Lower Body Dressing/Undressing Lower body dressing      What is the patient wearing?: Underwear/pull up,Pants     Lower body assist Assist for lower body dressing: Contact Guard/Touching assist     Toileting Toileting    Toileting assist Assist for toileting: Contact Guard/Touching assist     Transfers Chair/bed transfer  Transfers assist     Chair/bed transfer assist level: Supervision/Verbal cueing     Locomotion Ambulation   Ambulation assist      Assist level: Supervision/Verbal cueing Assistive device: No Device Max distance: 400   Walk 10 feet activity   Assist  Assist level: Supervision/Verbal cueing Assistive device: Walker-rolling   Walk 50 feet activity   Assist Walk 50 feet with 2 turns activity did not occur: Safety/medical concerns  Assist level: Supervision/Verbal cueing Assistive device: Walker-rolling    Walk 150 feet activity   Assist Walk 150 feet activity did not occur: Safety/medical concerns  Assist level: Supervision/Verbal cueing Assistive device: Walker-rolling    Walk 10 feet on uneven surface  activity   Assist Walk 10 feet on uneven surfaces activity did not occur: Safety/medical concerns   Assist level: Minimal Assistance - Patient > 75% Assistive device: Photographer Will patient use wheelchair at discharge?: No             Wheelchair 50 feet with 2 turns  activity    Assist            Wheelchair 150 feet activity     Assist          Blood pressure 113/71, pulse 88, temperature 98.5 F (36.9 C), resp. rate 18, height 5\' 5"  (1.651 m), weight 93 kg, SpO2 96 %.  Medical Problem List and Plan: 1.   Debility secondary to AKI from combination of volume contraction/pigment nephropathy/acute metabolic encephalopathy from rhabdo.  Cranial CT scan negative             -patient may shower             -ELOS/Goals: modI in 7-8 days  -Continue CIR PT, OT 2.  Antithrombotics: -DVT/anticoagulation: Continue Subcutaneous heparin             -antiplatelet therapy: N/A 3. Pain Management: Robaxin and oxycodone as needed  2/27- added Gabapentin 300 mg TID for nerve pain and increased oxy to 5-10 mg q4 hours prn; lidoderm wasn't helpful- stopped it for back pain.   3/3- pain controlled- con't regimen with Oxy and Gabapentin  3/4- pt reports pain NOT controlled- wants meds increased- explained if having muscle spasms, neds to treat that, not just increase oxy- changed to flexeril 10 mg TID for muscle tightness  3/7- pt reports no different with flexeril- however didn't c/o issues over weekend to Dr 5/7.   3/9- admits muscle spasms now are intermittent, so think meds are working some. Kpad slightly helpful- con't regimen  3/10- says still having pain- wants Oxy increased, which I don't feel comfortable doing- will con't regimen  3/11- describes nerve pain still in LLE- burning- will increase Gabapentin to 600 mg BID to help-  4. Mood: Provide emotional support             -antipsychotic agents: N/A 5. Neuropsych: This patient is capable of making decisions on his own behalf. 6. Skin/Wound Care: Routine skin checks 7. Fluids/Electrolytes/Nutrition: Routine in and outs with follow-up chemistries 8.  Left foot drop/left peroneal nerve compression.  Follow-up outpatient nerve conduction study. Will benefit from an AFO- ordered  3/2- AFO  delivered  3/8- using AFO during therapy- is helpful per pt- con't AFO 9.  Leukocytosis question aspiration.  Continue Augmentin x2 more days and stop.  2/27- labs in AM s/p Augmentin  2/28: WBC reviewed- 13.9- stable but trending upward, repeat tomorrow to trend  3/3- Off ABX- WBC still slightly elevated, but no signs of infection and pt denies.  10.  History of alcohol tobacco use.  Provide counseling- + elevated ethanol level on admit 11.  Abnormal LFTs/transaminitis due to rhabdo.  Follow-up liver function studies  2/27- Last LFTs  in 400s- will recheck Monday am to see downtrend, hopefully  2/28: LFTS reviewed: trending downward, repeat in 1 week.   3/1- down to 100s- much improved  3/8- LFTs down to normal levels again- con't to monitor  3/9- since pt has hx of drinking alcohol- needs to be careful in giving opiates long term.  12.  Hypertension.  Lopressor 12.5 mg twice daily.  Monitor with increased mobility  2/27- BP slightly elevated in 140s systolic- pulse 80s- if stays up, will increase Lopressor  3/9- BP well controlled- con't regimen  3/10- BP 99/80 this AM- unusual for him- will monitor  3/11- BP 110s/70s- con't regimen 13.  AKI from combination of volume contraction/rhabdo.  Continue IV fluids x24 more hours.  Cr currently 1.19- resolved, continue to monitor.  Nephrology follow-up  2/27- CK is down to 6000 from 32K on 2/23- max was >50k- - Cr doing well  3/2- Recheck CK tomorrow- and monitor Cr/BUN.   3/3- Cr down to 1.20 and CK is down to 2146, [facility] better- even off IVFs  3/4- will recheck Monday  3/7- Cr 1.17- and BUN 18  3/9- will check labs Friday since being d/c'd Monday.  3/10- labs tomorrow   3/11- Cr 1.26- slightly elevated due to Lasix, likely-will have pt check at f/u.  14.  Morbid obesity.  BMI 40.57.  Dietary follow-up  3/8- BMI down to 34.60- due to losing fluid- will monitor 15.  Constipation.  MiraLAX daily, Senokot twice daily.  Colace twice daily as  needed  3/7- going regularly- con't regimen  3/10- LBM yesterday per pt- con't regimen 16.  OSA.  Continue CPAP  3/9- refusing CPAP nightly.  3/10- says will not wear- refused to wear at home as well.   17.  History of sigmoid diverticulitis perforation 2012.  Follow-up outpatient 18. Rhabdomyolysis: CK is trending downward, continue to monitor  2/27- per #13  3/1- called renal due to swelling/pitting edema- they agreed that we need to stop IVFs- were stopped- will also give 1 dose of Lasix 40 mg PO.   3/2- give another Lasix 40 mg daily x 2 days- and recheck CK tomorrow.    3/3- 1 more dose of Lasix today- for fluid in tissues.   3/4- No more Lasix- edema resolved  3/7- CK down to 1,479 - is almost in normal range- Cr looks great and weight is down to 94 kg- don't see a reason to give more Lasix and K+ 3.7  3/8- d/w pt- explained we don't like to give a lot of Lasix for dregs of fluid because it can have kidneys take a hit and have to replete K+ which is a big pill- pt agreeable.   3/10- discussed Lasix again- decided against it.   3/11- pt insistent still having swelling- will give 1 more dose Lasix 40 mg x1- and give KCl 40 mEqx1 to prevent hypokalemia 19.  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

## 2021-02-25 NOTE — Progress Notes (Signed)
Physical Therapy Session Note  Patient Details  Name: Shane Guzman MRN: [id number] Date of Birth: August 28, 1976  Today's Date: 02/25/2021 PT Individual Time: 6578-4696 and 1100-1200 PT Individual Time Calculation (min): 58 min and 60 mins  Short Term Goals: Week 1:  PT Short Term Goal 1 (Week 1): Pt wil perform supine<>sit with CGA PT Short Term Goal 1 - Progress (Week 1): Met PT Short Term Goal 2 (Week 1): Pt will perform sit<>stand using LRAD with CGA PT Short Term Goal 2 - Progress (Week 1): Met PT Short Term Goal 3 (Week 1): Pt will perform bed<>chair transfers using LRAD with CGA PT Short Term Goal 3 - Progress (Week 1): Met PT Short Term Goal 4 (Week 1): Pt will ambulate at least 140f using LRAD with CGA PT Short Term Goal 4 - Progress (Week 1): Met PT Short Term Goal 5 (Week 1): Pt will ascend/descend 12 steps using HRs with CGA PT Short Term Goal 5 - Progress (Week 1): Met Week 2:  PT Short Term Goal 1 (Week 2): Pt will ascend/descend 12 steps using HRs with SBA PT Short Term Goal 2 (Week 2): Pt will ambulate at least 1547fusing LRAD with SBA PT Short Term Goal 3 (Week 2): pt to demonstrate mod I functional transfers with LRAD  Skilled Therapeutic Interventions/Progress Updates:    pt received in bed and agreeable to therapy. Pt reported 6/10 pain in LLE. Pt directed in supine>sit from [facility] at mod I. Pt then directed in Sit to stand to Rolling walker at supervision pt then requested to brush teeth and wash face before leaving room, completed in standing with one UE support at sink and pt able to complete with distant supervision. Pt then reported he needed to sit 2/2 L leg pain and rest. Post this, pt directed in initiating gait training with Rolling walker for 300' at supervision with VC for gait pattern and posture. Pt directed in 2x15 heel raises on airex pad with one UE support at [facility] Centeror improved balance and weight bearing on LLE in standing. Pt declined to  attempt gait without AD 2/2 pain in LLE. Pt then directed in Nustep for 8 mins L7 for improved BLE strengthening, reciprocal mobility and tolerance to activity. Pt then returned to room with Rolling walker 300' supervision and requested to stay in recliner. Pt left with alarm set, All needs in reach and in good condition. Call light in hand.    Session 2: pt received in bed and agreeable to therapy. Pt reported 7/10 pain at start and end of session and reported he had already gotten pain medication. All in LLE and buttocks on Lt side. Pt directed in supine>sit mod I, Sit to stand with Rolling walker and gait training with Rolling walker 300' supervision with VC for gait mechanics and pattern. Pt then sat in [facility]nd taken off unit down stairs total A for time and energy. Pt directed in [facility] LLCobility 150' supervision. Pt then directed in gait training with Rolling walker around numerous obstacles in open, distractible environment at supervision with transfers on/off various surfaces for improved I with community re- integration . Pt [facility] to [facility] in [facility]total A for time and energy. Pt then directed in seated BLE exercises 2x20 5# of LAQ and marches with 3s holds; seated BUE strengthening exercises 2x20 5# arm bar of bicep curls, chest press, overhead press and front raises. Pt then directed in gait to room 150' supervision and returned  to bed, supervision. Pt left in bed, All needs in reach and in good condition. Call light in hand.  And alarm set.   Therapy Documentation Precautions:  Precautions Precautions: Fall Precaution Comments: LLE edema/numbness/foot drop, back pain Restrictions Weight Bearing Restrictions: No RLE Weight Bearing: Weight bearing as tolerated LLE Weight Bearing: Weight bearing as tolerated Other Position/Activity Restrictions: "WBAT with functional AFO" per ortho MD note 2/20 General:   Vital Signs:   Pain: Pain Assessment Pain Scale: 0-10 Pain Score: 8  Pain Type: Acute  pain Pain Location: Leg Pain Orientation: Left;Posterior Pain Descriptors / Indicators: Aching;Burning Pain Frequency: Constant Pain Onset: On-going Patients Stated Pain Goal: 2 Pain Intervention(s): Medication (See eMAR) Multiple Pain Sites: No Mobility:   Locomotion :    Trunk/Postural Assessment :    Balance:   Exercises:   Other Treatments:      Therapy/Group: Individual Therapy  Junie Panning 02/25/2021, 10:57 AM

## 2021-02-26 DIAGNOSIS — M21372 Foot drop, left foot: Secondary | ICD-10-CM

## 2021-02-26 DIAGNOSIS — K5903 Drug induced constipation: Secondary | ICD-10-CM

## 2021-02-26 DIAGNOSIS — N179 Acute kidney failure, unspecified: Secondary | ICD-10-CM

## 2021-02-26 DIAGNOSIS — M792 Neuralgia and neuritis, unspecified: Secondary | ICD-10-CM

## 2021-02-26 MED ORDER — GABAPENTIN 300 MG PO CAPS
600.0000 mg | ORAL_CAPSULE | Freq: Three times a day (TID) | ORAL | Status: DC
  Administered 2021-02-26 – 2021-02-28 (×6): 600 mg via ORAL
  Filled 2021-02-26 (×6): qty 2

## 2021-02-26 NOTE — Progress Notes (Signed)
Occupational Therapy Session Note  Patient Details  Name: Shane Guzman MRN: [id number] Date of Birth: August 19, 1976  Today's Date: 02/26/2021 OT Individual Time: 9458-5929 OT Individual Time Calculation (min): 53 min and 28 min   Short Term Goals: Week 2:  OT Short Term Goal 1 (Week 2): STGs=LTGs set at Mod I overall  Skilled Therapeutic Interventions/Progress Updates:     Session 1 (2446-2863): Pt received asleep in bed, easily awakened by voice, agreeable to therapy with encouragement. Denied pain, but c/o of fatigue 2/2 poor night's sleep. Pt picked out clothes/shower items from bag with set-up A. Completed bed mobility mod I with use of bed rail. STS and amb to bathroom to complete shower transfer with RW and distant S for safety. Bathed and dressed full body with set-up A for necessary items and use of LH sponge and grab bar for STS. Max A to don B TEDS, pt edu on technique to don if req to wear at home. Donned B shoes set-up A, attempting to don L AFO, but req mod A to readjust. Sat at sink to brush teeth mod I. Amb to and from gym with RW and close S. To target improved BLE strength and activity tolerance to improve ADL/IADL performance,  Pt directed in Nustep L8 for 5 min with an average step length of 50+, reports 7/10 on mod RPE after activity. Edu on use of RPE for monitoring fatigue and for energy conservation with daily activities. Amb back to bed, doffed B shoes and completed bed mobility mod I.  Pt left semi-reclined in bed with alarm engaged, call bell in reach, and all immediate needs met.    Session 2 [phone]): Pt received semi-reclined in bed, agreeable to therapy. C/o of LLE pain, did not quantify. Session focus on functional mobility, BUE/BLE/core strengthening, dynamic standing balance, and activity tolerance in prep for improved ADL/IADL/func mobility performance. Pt completed bed mobility mod I with use of bed rail. Donned B shoes with AFO already attached close S. STS  and amb to elevator with close S and RW. Sat in w/c down to lobby for change of scenery. Pt completed 1x15 of STS and overhead reaches with 2 kg medicine balls, STS with standing marches. 1 posterior LOB back into chair when standing only on BLE. W/c transport back to room and amb transfer back to bed close S. Doffed TEDS mod A to remove L hose and doffed B shoes close S.  Pt left semi-reclined in bed with bed alarm engaged, call bell in reach, and all immediate needs met.    Therapy Documentation Precautions:  Precautions Precautions: Fall Precaution Comments: LLE edema/numbness/foot drop, back pain Restrictions Weight Bearing Restrictions: No RLE Weight Bearing: Weight bearing as tolerated LLE Weight Bearing: Weight bearing as tolerated Other Position/Activity Restrictions: "WBAT with functional AFO" per ortho MD note 2/20 Pain: see session notes  ADL: See Care Tool for more details.   Therapy/Group: Individual Therapy  Volanda Napoleon MS, OTR/L  02/26/2021, 6:43 AM

## 2021-02-26 NOTE — Discharge Summary (Signed)
Physician Discharge Summary  Patient ID: Shane Guzman MRN: [id number] DOB/AGE: 1976-08-18 44 y.o.  Admit date: 02/11/2021 Discharge date: 02/28/2021  Discharge Diagnoses:  Principal Problem:   Debility Active Problems:   Neuropathic pain   Drug induced constipation   Left foot drop   AKI (acute kidney injury) (HCC) DVT prophylaxis Morbid obesity OSA Hypertension Tobacco with alcohol use Sigmoid diverticulitis  Discharged Condition: Stable  Significant Diagnostic Studies: CT ABDOMEN PELVIS WO CONTRAST  Result Date: 02/04/2021 CLINICAL DATA:  Acute generalized abdominal pain. EXAM: CT ABDOMEN AND PELVIS WITHOUT CONTRAST TECHNIQUE: Multidetector CT imaging of the abdomen and pelvis was performed following the standard protocol without IV contrast. COMPARISON:  June 12, 2015. FINDINGS: Lower chest: Mild bilateral posterior basilar subsegmental atelectasis or infiltrates are noted. Hepatobiliary: No focal liver abnormality is seen. No gallstones, gallbladder wall thickening, or biliary dilatation. Pancreas: Unremarkable. No pancreatic ductal dilatation or surrounding inflammatory changes. Spleen: Normal in size without focal abnormality. Adrenals/Urinary Tract: Adrenal glands and kidneys appear normal. No hydronephrosis or renal obstruction is noted. No renal or ureteral calculi are noted. Urinary bladder is decompressed secondary to Foley catheter. Stomach/Bowel: Stomach is within normal limits. Appendix appears normal. No evidence of bowel obstruction. Sigmoid diverticulosis is noted. There is focal wall thickening involving the proximal sigmoid colon with minimal surrounding inflammatory changes; is uncertain if this represents acute diverticulitis or sequela of previous such inflammation. Vascular/Lymphatic: No significant vascular findings are present. No enlarged abdominal or pelvic lymph nodes. Reproductive: Prostate is unremarkable. Other: No abdominal wall hernia or abnormality. No  abdominopelvic ascites. Musculoskeletal: No acute or significant osseous findings. IMPRESSION: 1. Mild bilateral posterior basilar subsegmental atelectasis or infiltrates are noted. 2. Focal wall thickening is seen involving the proximal sigmoid colon with minimal surrounding inflammatory changes; it is uncertain if this represents acute diverticulitis or sequela of previous such inflammation. Electronically Signed   By: Lupita Raider M.D.   On: 02/04/2021 09:28   DG Abd 1 View  Result Date: 02/06/2021 CLINICAL DATA:  Abdominal pain and distention. EXAM: ABDOMEN - 1 VIEW COMPARISON:  02/04/2021 FINDINGS: The previously demonstrated enteric tube appears to have been removed. There is nonspecific gaseous distension the:. There is no significant evidence for a small bowel obstruction. There are retrocardiac airspace opacities suggestive of atelectasis. There is no pneumatosis or free air. There is a relative paucity of bowel gas at the level of the rectum. IMPRESSION: Nonspecific gaseous distension of the colon. Electronically Signed   By: Katherine Mantle M.D.   On: 02/06/2021 15:28   CT HEAD WO CONTRAST  Result Date: 02/04/2021 CLINICAL DATA:  Neck trauma, intoxicated or obtained EXAM: CT HEAD WITHOUT CONTRAST CT CERVICAL SPINE WITHOUT CONTRAST TECHNIQUE: Multidetector CT imaging of the head and cervical spine was performed following the standard protocol without intravenous contrast. Multiplanar CT image reconstructions of the cervical spine were also generated. COMPARISON:  None. FINDINGS: CT HEAD FINDINGS Brain: No evidence of acute infarction, hemorrhage, hydrocephalus, extra-axial collection or mass lesion/mass effect. Vascular: No focal hyperdense vessel or unexpected calcification. Skull: Normal. Negative for fracture or focal lesion. Sinuses/Orbits: No acute finding. CT CERVICAL SPINE FINDINGS Alignment: Normal. Skull base and vertebrae: No acute fracture. No primary bone lesion or focal  pathologic process. Soft tissues and spinal canal: No prevertebral fluid or swelling. No visible canal hematoma. Disc levels:  Mid cervical degenerative disc narrowing and ridging. Upper chest: Atelectasis seen in the right upper lobe. IMPRESSION: No acute finding in the head or cervical spine.  Electronically Signed   By: Marnee Spring M.D.   On: 02/04/2021 07:13   CT CERVICAL SPINE WO CONTRAST  Result Date: 02/04/2021 CLINICAL DATA:  Neck trauma, intoxicated or obtained EXAM: CT HEAD WITHOUT CONTRAST CT CERVICAL SPINE WITHOUT CONTRAST TECHNIQUE: Multidetector CT imaging of the head and cervical spine was performed following the standard protocol without intravenous contrast. Multiplanar CT image reconstructions of the cervical spine were also generated. COMPARISON:  None. FINDINGS: CT HEAD FINDINGS Brain: No evidence of acute infarction, hemorrhage, hydrocephalus, extra-axial collection or mass lesion/mass effect. Vascular: No focal hyperdense vessel or unexpected calcification. Skull: Normal. Negative for fracture or focal lesion. Sinuses/Orbits: No acute finding. CT CERVICAL SPINE FINDINGS Alignment: Normal. Skull base and vertebrae: No acute fracture. No primary bone lesion or focal pathologic process. Soft tissues and spinal canal: No prevertebral fluid or swelling. No visible canal hematoma. Disc levels:  Mid cervical degenerative disc narrowing and ridging. Upper chest: Atelectasis seen in the right upper lobe. IMPRESSION: No acute finding in the head or cervical spine. Electronically Signed   By: Marnee Spring M.D.   On: 02/04/2021 07:13   MR LUMBAR SPINE WO CONTRAST  Result Date: 02/07/2021 CLINICAL DATA:  Low back pain with left foot drop. EXAM: MRI LUMBAR SPINE WITHOUT CONTRAST TECHNIQUE: Multiplanar, multisequence MR imaging of the lumbar spine was performed. No intravenous contrast was administered. COMPARISON:  Lumbar radiographs 09/29/2007 FINDINGS: Segmentation: Motion degraded study. The  patient was not able to complete the study and axial images were not obtained. Normal segmentation. Alignment:  Normal Vertebrae:  Negative for fracture or mass. Conus medullaris and cauda equina: Conus not well visualized. Paraspinal and other soft tissues: Extensive edema is present in the right paraspinous muscles without fluid collection. Left muscles appear normal. Disc levels: L1-2: Negative L2-3: Negative L3-4: Disc degeneration with disc bulging and associated spurring. No significant stenosis L4-5: Disc degeneration with disc bulging and associated endplate spurring. Mild to moderate subarticular stenosis bilaterally L5-S1: Mild disc bulging.  Mild subarticular stenosis bilaterally. IMPRESSION: Incomplete and motion degraded study. The patient was not able to tolerate axial images There is extensive edema in the right paraspinous muscle suggesting myositis. This could be infectious or posttraumatic. Mild lumbar degenerative disc disease at L3-4, L4-5, L5-S1. Electronically Signed   By: Marlan Palau M.D.   On: 02/07/2021 15:13   US RENAL  Result Date: 02/04/2021 CLINICAL DATA:  Acute renal failure. EXAM: RENAL / URINARY TRACT ULTRASOUND COMPLETE COMPARISON:  CT abdomen and pelvis today. FINDINGS: Right Kidney: Renal measurements: 11.6 x 5.7 x 5.3 cm = volume: 183.5 mL. Echogenicity within normal limits. No mass or hydronephrosis visualized. Left Kidney: Renal measurements: 12.1 x 6.5 x 6.0 cm = volume: 247 mL. Echogenicity within normal limits. No mass or hydronephrosis visualized. Bladder: Not visualized. Note is made that the urinary bladder is completely decompressed with a Foley catheter in place on the comparison CT. Other: None. IMPRESSION: Negative for hydronephrosis.  Normal appearing kidneys. Electronically Signed   By: Drusilla Kanner M.D.   On: 02/04/2021 13:11   DG Pelvis Portable  Result Date: 02/04/2021 CLINICAL DATA:  Altered mental status.  Left hip pain. EXAM: PORTABLE PELVIS 1-2  VIEWS COMPARISON:  None. FINDINGS: There is no evidence of pelvic fracture or diastasis. No pelvic bone lesions are seen. IMPRESSION: Negative. Electronically Signed   By: Marnee Spring M.D.   On: 02/04/2021 07:33   DG Chest Port 1 View  Result Date: 02/05/2021 CLINICAL DATA:  Concern for aspiration  pneumonia EXAM: PORTABLE CHEST 1 VIEW COMPARISON:  Yesterday FINDINGS: Cardiomegaly. Enteric tube with tip at the stomach. Negative aortic and hilar contours for technique. Low volume chest with interstitial crowding and streaky density at the bases where there is opacity and volume loss on CT of the abdomen from yesterday. No edema . No effusion or pneumothorax. IMPRESSION: Stable lower lobe infiltrates as seen by chest CT yesterday. Electronically Signed   By: Marnee Spring M.D.   On: 02/05/2021 07:25   DG Chest Port 1 View  Result Date: 02/04/2021 CLINICAL DATA:  Unresponsive EXAM: PORTABLE CHEST 1 VIEW COMPARISON:  08/06/2011 FINDINGS: Borderline heart size accentuated by technique. Indistinct perihilar density. No visible effusion or pneumothorax. IMPRESSION: Perihilar opacity with history favoring atelectasis or aspiration. Electronically Signed   By: Marnee Spring M.D.   On: 02/04/2021 06:44   DG Abd Portable 1 View  Result Date: 02/04/2021 CLINICAL DATA:  NG tube placement. EXAM: PORTABLE ABDOMEN - 1 VIEW COMPARISON:  11/07/2007. FINDINGS: 1018 hours. NG tube tip is in the mid stomach. Proximal side port of the NG tube is just above the GE junction. Visualized upper abdomen demonstrates nonspecific gas pattern. IMPRESSION: NG tube tip is in the mid stomach although proximal side port is above the GE junction. Tube could be advanced approximately 5-6 cm for placement of the side port below the GE junction. Electronically Signed   By: Kennith Center M.D.   On: 02/04/2021 10:30   VAS [place] ABI WITH/WO TBI  Result Date: 02/05/2021 LOWER EXTREMITY DOPPLER STUDY Indications: Rest pain.  Limitations:  Today's exam was limited due to patient positioning, involuntary              patient movement, bandages , patient unable to lie flat and IV,              ambient room "noise". Comparison Study: No prior studies. Performing Technologist: Olen Cordial RVT  Examination Guidelines: A complete evaluation includes at minimum, Doppler waveform signals and systolic blood pressure reading at the level of bilateral brachial, anterior tibial, and posterior tibial arteries, when vessel segments are accessible. Bilateral testing is considered an integral part of a complete examination. Photoelectric Plethysmograph (PPG) waveforms and toe systolic pressure readings are included as required and additional duplex testing as needed. Limited examinations for reoccurring indications may be performed as noted.  ABI Findings: +--------+------------------+-----+---------+----------------------------------+ Right   Rt Pressure (mmHg)IndexWaveform Comment                            +--------+------------------+-----+---------+----------------------------------+ Brachial                                Unable to obtain waveform and                                              pressure due to IV and bandages    +--------+------------------+-----+---------+----------------------------------+ PTA     138               1.55 triphasic                                   +--------+------------------+-----+---------+----------------------------------+ DP      160  1.80 triphasic                                   +--------+------------------+-----+---------+----------------------------------+ +--------+------------------+-----+---------+-------+ Left    Lt Pressure (mmHg)IndexWaveform Comment +--------+------------------+-----+---------+-------+ Brachial89                     triphasic        +--------+------------------+-----+---------+-------+ PTA     167               1.88 triphasic         +--------+------------------+-----+---------+-------+ DP      152               1.71 triphasic        +--------+------------------+-----+---------+-------+ +-------+-----------+-----------+------------+------------+ ABI/TBIToday's ABIToday's TBIPrevious ABIPrevious TBI +-------+-----------+-----------+------------+------------+ Right  1.8                                            +-------+-----------+-----------+------------+------------+ Left   1.88                                           +-------+-----------+-----------+------------+------------+  Summary: Right: Resting right ankle-brachial index indicates noncompressible right lower extremity arteries. Values are likely falsely elevated. Captured waveforms do not represent what was heard during the test. Triphasic waveforms were noted in the posterior tibial and dorsalis pedis arteries. Unable to obtain TBI due to patient constant movement. Left: Resting left ankle-brachial index indicates noncompressible left lower extremity arteries. Values are likely falsely elevated. Captured waveforms do not represent what was heard during the test. Triphasic waveforms were noted in the posterior tibial and dorsalis pedis arteries. Unable to obtain TBI due to patient constant movement.  *See table(s) above for measurements and observations.  Electronically signed by Heath Larkhomas Hawken on 02/05/2021 at 11:05:12 AM.    Final    ECHOCARDIOGRAM COMPLETE  Result Date: 02/04/2021    ECHOCARDIOGRAM REPORT   Patient Name:   Shane Guzman Date of Exam: 02/04/2021 Medical Rec #:  [id number]       Height:       65.0 in Accession #:    [id number]      Weight:       225.0 lb Date of Birth:  11-03-1976       BSA:          2.080 m Patient Age:    44 years        BP:           139/121 mmHg Patient Gender: M               HR:           101 bpm. Exam Location:  Inpatient Procedure: 2D Echo, Cardiac Doppler and Color Doppler Indications:    Murmur  History:         Patient has no prior history of Echocardiogram examinations.                 Risk Factors:Sleep Apnea and Hypertension.  Sonographer:    Ross LudwigArthur Guy RDCS (AE) Referring Phys: [id number] [facility]  Sonographer Comments: Suboptimal subcostal window. IMPRESSIONS  1. Left ventricular ejection fraction, by estimation, is 60 to 65%. The left  ventricle has normal function. The left ventricle has no regional wall motion abnormalities. There is moderate left ventricular hypertrophy. Left ventricular diastolic parameters are consistent with Grade I diastolic dysfunction (impaired relaxation).  2. Right ventricular systolic function is normal. The right ventricular size is normal.  3. The mitral valve is normal in structure. No evidence of mitral valve regurgitation. No evidence of mitral stenosis.  4. The aortic valve is tricuspid. There is moderate calcification of the aortic valve. There is moderate thickening of the aortic valve. Aortic valve regurgitation is not visualized. Mild to moderate aortic valve sclerosis/calcification is present, without any evidence of aortic stenosis.  5. The inferior vena cava is normal in size with greater than 50% respiratory variability, suggesting right atrial pressure of 3 mmHg. FINDINGS  Left Ventricle: Left ventricular ejection fraction, by estimation, is 60 to 65%. The left ventricle has normal function. The left ventricle has no regional wall motion abnormalities. The left ventricular internal cavity size was normal in size. There is  moderate left ventricular hypertrophy. Left ventricular diastolic parameters are consistent with Grade I diastolic dysfunction (impaired relaxation). Right Ventricle: The right ventricular size is normal. No increase in right ventricular wall thickness. Right ventricular systolic function is normal. Left Atrium: Left atrial size was normal in size. Right Atrium: Right atrial size was normal in size. Pericardium: There is no evidence of pericardial  effusion. Mitral Valve: The mitral valve is normal in structure. No evidence of mitral valve regurgitation. No evidence of mitral valve stenosis. Tricuspid Valve: The tricuspid valve is normal in structure. Tricuspid valve regurgitation is not demonstrated. No evidence of tricuspid stenosis. Aortic Valve: The aortic valve is tricuspid. There is moderate calcification of the aortic valve. There is moderate thickening of the aortic valve. Aortic valve regurgitation is not visualized. Mild to moderate aortic valve sclerosis/calcification is present, without any evidence of aortic stenosis. Aortic valve mean gradient measures 6.0 mmHg. Aortic valve peak gradient measures 11.0 mmHg. Aortic valve area, by VTI measures 1.82 cm. Pulmonic Valve: The pulmonic valve was normal in structure. Pulmonic valve regurgitation is not visualized. No evidence of pulmonic stenosis. Aorta: The aortic root is normal in size and structure. Venous: The inferior vena cava is normal in size with greater than 50% respiratory variability, suggesting right atrial pressure of 3 mmHg. IAS/Shunts: No atrial level shunt detected by color flow Doppler.  LEFT VENTRICLE PLAX 2D LVIDd:         3.20 cm  Diastology LVIDs:         2.20 cm  LV e' medial:    6.53 cm/s LV PW:         1.80 cm  LV E/e' medial:  9.5 LV IVS:        1.70 cm  LV e' lateral:   5.00 cm/s LVOT diam:     2.10 cm  LV E/e' lateral: 12.4 LV SV:         42 LV SV Index:   20 LVOT Area:     3.46 cm  RIGHT VENTRICLE             IVC RV Basal diam:  2.60 cm     IVC diam: 1.30 cm RV S prime:     21.80 cm/s TAPSE (M-mode): 2.4 cm LEFT ATRIUM           Index       RIGHT ATRIUM           Index LA diam:  3.30 cm 1.59 cm/m  RA Area:     12.30 cm LA Vol (A2C): 17.4 ml 8.37 ml/m  RA Volume:   24.50 ml  11.78 ml/m LA Vol (A4C): 35.3 ml 16.97 ml/m  AORTIC VALVE AV Area (Vmax):    1.88 cm AV Area (Vmean):   1.72 cm AV Area (VTI):     1.82 cm AV Vmax:           166.00 cm/s AV Vmean:           115.000 cm/s AV VTI:            0.232 m AV Peak Grad:      11.0 mmHg AV Mean Grad:      6.0 mmHg LVOT Vmax:         90.10 cm/s LVOT Vmean:        57.100 cm/s LVOT VTI:          0.122 m LVOT/AV VTI ratio: 0.53  AORTA Ao Root diam: 3.20 cm Ao Asc diam:  3.00 cm MITRAL VALVE MV Area (PHT): 3.37 cm     SHUNTS MV Decel Time: 225 msec     Systemic VTI:  0.12 m MV E velocity: 61.80 cm/s   Systemic Diam: 2.10 cm MV A velocity: 105.00 cm/s MV E/A ratio:  0.59 Donato Schultz MD Electronically signed by Donato Schultz MD Signature Date/Time: 02/04/2021/5:25:46 PM    Final    VAS [place] LOWER EXTREMITY VENOUS (DVT)  Result Date: 02/07/2021  Lower Venous DVT Study Indications: Edema.  Risk Factors: None identified. Limitations: Poor ultrasound/tissue interface and body habitus. Comparison Study: No prior studies. Performing Technologist: Chanda Busing RVT  Examination Guidelines: A complete evaluation includes B-mode imaging, spectral Doppler, color Doppler, and power Doppler as needed of all accessible portions of each vessel. Bilateral testing is considered an integral part of a complete examination. Limited examinations for reoccurring indications may be performed as noted. The reflux portion of the exam is performed with the patient in reverse Trendelenburg.  +---------+---------------+---------+-----------+----------+--------------+ RIGHT    CompressibilityPhasicitySpontaneityPropertiesThrombus Aging +---------+---------------+---------+-----------+----------+--------------+ CFV      Full           Yes      Yes                                 +---------+---------------+---------+-----------+----------+--------------+ SFJ      Full                                                        +---------+---------------+---------+-----------+----------+--------------+ FV Prox  Full                                                         +---------+---------------+---------+-----------+----------+--------------+ FV Mid   Full                                                        +---------+---------------+---------+-----------+----------+--------------+ FV DistalFull                                                        +---------+---------------+---------+-----------+----------+--------------+  PFV      Full                                                        +---------+---------------+---------+-----------+----------+--------------+ POP      Full           Yes      Yes                                 +---------+---------------+---------+-----------+----------+--------------+ PTV      Full                                                        +---------+---------------+---------+-----------+----------+--------------+ PERO     Full                                                        +---------+---------------+---------+-----------+----------+--------------+   +---------+---------------+---------+-----------+----------+--------------+ LEFT     CompressibilityPhasicitySpontaneityPropertiesThrombus Aging +---------+---------------+---------+-----------+----------+--------------+ CFV      Full           Yes      Yes                                 +---------+---------------+---------+-----------+----------+--------------+ SFJ      Full                                                        +---------+---------------+---------+-----------+----------+--------------+ FV Prox  Full                                                        +---------+---------------+---------+-----------+----------+--------------+ FV Mid   Full                                                        +---------+---------------+---------+-----------+----------+--------------+ FV DistalFull                                                         +---------+---------------+---------+-----------+----------+--------------+ PFV      Full                                                        +---------+---------------+---------+-----------+----------+--------------+   POP      Full           Yes      Yes                                 +---------+---------------+---------+-----------+----------+--------------+ PTV      Full                                                        +---------+---------------+---------+-----------+----------+--------------+ PERO     Full                                                        +---------+---------------+---------+-----------+----------+--------------+     Summary: RIGHT: - There is no evidence of deep vein thrombosis in the lower extremity. However, portions of this examination were limited- see technologist comments above.  - No cystic structure found in the popliteal fossa.  LEFT: - There is no evidence of deep vein thrombosis in the lower extremity. However, portions of this examination were limited- see technologist comments above.  - No cystic structure found in the popliteal fossa.  *See table(s) above for measurements and observations. Electronically signed by Fabienne Bruns MD on 02/07/2021 at 5:53:12 PM.    Final     Labs:  Basic Metabolic Panel: Recent Labs  Lab 02/21/21 0604 02/25/21 0544  NA 134* 135  K 3.7 4.1  CL 98 100  CO2 24 25  GLUCOSE 125* 120*  BUN 18 19  CREATININE 1.17 1.26*  CALCIUM 9.6 9.6    CBC: Recent Labs  Lab 02/21/21 0604 02/25/21 0544  WBC 7.4 6.9  NEUTROABS 4.7 4.4  HGB 13.5 13.2  HCT 39.8 39.1  MCV 99.7 98.0  PLT 309 252    CBG: No results for input(s): GLUCAP in the last 168 hours.  Family history.  Maternal grandmother with diverticulitis.  Denies any diabetes mellitus colon cancer or esophageal cancer  Brief HPI:   Shane Guzman is a 45 y.o. right-handed male with history of hypertension as well as OSA on CPAP, tobacco  and alcohol use, sigmoid diverticulitis with perforation 2012 with marginal medical compliance..  Per chart review patient lives alone independent prior to admission works as a Estate agent.  He does have a close friend to assist on discharge.  Presented 02/04/2021 after being found down, he was diaphoretic and foaming at the mouth.  EMS was activated he had a pulse with agonal respiratory pattern hypoxemia.  He did receive Narcan.  Patient was hypotensive with IV fluids initiated.  Patient did not require intubation.  Cranial CT and cervical CT CT scan were negative.  Chest x-ray showed possible aspiration pneumonia.  Negative CT of chest abdomen pelvis but did show some mild bilateral posterior basilar segmental atelectasis and/or infiltrates.  Focal wall thickening seen involving proximal sigmoid colon with minimal surrounding inflammatory changes.  It was uncertain if this represented acute diverticulitis or sequela of previous such inflammation.  Admission chemistries potassium 6.3 creatinine 3.22 CK greater than 50,000 lipase 30 amylase 356 troponin 1.6 3-1 0.96 ammonia level 42 alcohol level 84  urine culture no growth urine drug screen negative blood cultures no growth to date WBC 15,000.  Echocardiogram with ejection fraction of 60 to 65% grade 1 diastolic dysfunction.  Patient's elevated troponin felt to be related to demand ischemia.  Nephrology services consulted for AKI as well as rhabdo and hyperkalemia.  Renal ultrasound no hydronephrosis.  Patient responded well to IV fluids latest creatinine 1.19 and latest CK 13,468.  LFTs mildly elevated suspect secondary to rhabdo continue to improve.  Hospital course orthopedic services consulted 02/06/2021 for left foot drop suspect left peroneal nerve compression.  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  Maintained on subcutaneous heparin for DVT prophylaxis.  Patient  completed course of Augmentin for question aspiration pneumonia.  Physical and Occupational Therapy consulted to assess candidacy for CIR given impaired mobility and was admitted for a comprehensive rehab program   Hospital Course: Shane Guzman was admitted to rehab 02/11/2021 for inpatient therapies to consist of PT, ST and OT at least three hours five days a week. Past admission physiatrist, therapy team and rehab RN have worked together to provide customized collaborative inpatient rehab.  Pertaining to patient's debility secondary to AKI from combination of volume contraction pigment nephropathy acute metabolic encephalopathy from rhabdo.  Cranial CT scan negative.  Patient was participating with therapies.  His latest CK much improved 1089 as well as renal function 1.26.  Subcutaneous heparin for DVT prophylaxis.  Pain managed with use of Neurontin titrated as needed Flexeril for muscle spasms he was using Robaxin oxycodone as needed for breakthrough pain.  Left foot drop felt to be related to left peroneal nerve compression follow-up outpatient nerve conduction study was fitted with an AFO.   Patient did have a history of alcohol tobacco use alcohol mildly elevated on admission and monitored as well as patient receiving counseling.  Blood pressure controlled on Lopressor.  AKI as noted above continue to improve latest creatinine 1.26.  Morbid obesity BMI 34.6 no dietary follow-up.  Bouts of constipation resolved with laxative assistance.  OSA with CPAP patient was refusing at times.   Blood pressures were monitored on TID basis and controlled     Rehab course: During patient's stay in rehab weekly team conferences were held to monitor patient's progress, set goals and discuss barriers to discharge. At admission, patient required minimal assist 30 feet rolling walker moderate assist lateral scoot transfers minimal assist sit to side-lying.  Total assist lower body bathing minimal assist upper body  dressing total assist lower body dressing  Physical exam.  Blood pressure 142/96 pulse 86 temperature 98.3 respirations 18 oxygen saturation 97% room air Constitutional.  No acute distress HEENT Head.  Normocephalic and atraumatic Neck.  Supple nontender no JVD without thyromegaly Cardiac regular rate rhythm not extra sounds or murmur heard Abdomen.  Soft nontender positive bowel sounds without rebound Respiratory effort normal no respiratory distress without wheeze Skin.  Intact Neurologic.  Alert oriented follows commands.  Upper extremities 5/5 bilaterally. Left lower extremity 3/5 throughout limited by pain and edema 0/5 dorsi plantarflexion  Shane Guzman  has had improvement in activity tolerance, balance, postural control as well as ability to compensate for deficits. Shane Guzman has had improvement in functional use RUE/LUE  and RLE/LLE as well as improvement in awareness.  Modified independent supine to sit.  Sit to stand with rolling walker supervision.  Ambulates 300 feet supervision verbal cues for gait pattern with AFO.  Patient directed in  gait training with rolling walker around numerous obstacles in open distractible environment at supervision level.  He can gather his belongings for activities day living and homemaking.  Full family teaching was completed plan discharged home       Disposition: Discharged to home    Diet: Regular  Special Instructions: No driving smoking or alcohol  Arranges made for outpatient EMG study  Medications at discharge. 1.  Flexeril 10 mg p.o. 3 times daily 2.  Senokot S1 tablet p.o. twice daily 3.  Pepcid 10 mg p.o. daily 4.  Neurontin 600 mg p.o. 3 times daily 5.  Lopressor 12.5 mg p.o. twice daily 6.  Oxycodone 5 to 10 mg every 4 hours as needed pain 7.  MiraLAX daily hold for loose stools  30-35 minutes were spent completing discharge summary and discharge planning     Follow-up Information    Marcello Fennel, MD Follow up.    Specialty: Physical Medicine and Rehabilitation Why: follow up out patient for EMG patient with debility related to rhabdomyolysis Contact information: [address] [place] [address] [phone]        Zetta Bills, MD Follow up.   Specialty: Nephrology Why: call for appointment Contact information: [address] [place] [address] [phone]               Signed: Mcarthur Rossetti Angiulli 02/28/2021, 5:12 AM

## 2021-02-26 NOTE — Progress Notes (Signed)
Occupational Therapy Session Note  Patient Details  Name: Shane Guzman MRN: [id number] Date of Birth: 12/13/1976  Today's Date: 02/26/2021 OT Individual Time: 4818-5631 OT Individual Time Calculation (min): 57 min   Short Term Goals: Week 2:  OT Short Term Goal 1 (Week 2): STGs=LTGs set at Mod I overall      Skilled Therapeutic Interventions/Progress Updates:    Pt greeted in bed, medicated for pain by RN at start of session. Per pt, he had already taken his shower today during prior OT. He was agreeable to tx. Had him try to don his Lt AFO himself with pt requiring Min A for this. Discussed the need to have family still help him at home to don the Lt AFO. Pt verbalized understanding. Pts father and SO [name] have also been educated on this. Pt was escorted via w/c to a community setting of the hospital. In the food court, pt practiced pulling out chairs to sit, transferring into/out of booths, scooting in booths, and completing sit<stands from low seats without armrests. All mobility completed at supervision level using the RW. Pt required multiple seated rest breaks due to limited standing endurance and pain. Once he was returned to the room via w/c, he ambulated back to the bed using RW. OT demonstrated how to use his gait belt strap to complete stretches for the Lt LE including graded hamstring, hip, and ankle stretches. Pt able to return understanding of education while completing these exercises while sitting EOB and also while supine in bed. We discussed importance of completing all exercises without increase in resting neuropathic pain. Education also provided on routine stretching to help with managing his pain while improving Lt LE flexibility. He remained in bed at close of session, all needs within reach and bed alarm set.    Therapy Documentation Precautions:  Precautions Precautions: Fall Precaution Comments: LLE edema/numbness/foot drop, back pain Restrictions Weight Bearing  Restrictions: No RLE Weight Bearing: Weight bearing as tolerated LLE Weight Bearing: Weight bearing as tolerated Other Position/Activity Restrictions: "WBAT with functional AFO" per ortho MD note 2/20 Pain: Pain Assessment Pain Scale: 0-10 Pain Score: 0-No pain ADL: ADL Eating: Independent Where Assessed-Eating: Chair Grooming: Modified independent Where Assessed-Grooming: Edge of bed Upper Body Bathing: Modified independent Where Assessed-Upper Body Bathing: Edge of bed Lower Body Bathing: Modified independent Where Assessed-Lower Body Bathing: Edge of bed Upper Body Dressing: Modified independent (Device) Where Assessed-Upper Body Dressing: Edge of bed Lower Body Dressing: Modified independent Where Assessed-Lower Body Dressing: Edge of bed Toileting: Modified independent Where Assessed-Toileting: Bedside Commode Toilet Transfer: Modified independent Statistician Method: Proofreader: Animator Transfer: Modified independent Film/video editor: Not assessed      Therapy/Group: Individual Therapy  Chaitra Mast A Deisha Stull 02/26/2021, 12:19 PM

## 2021-02-26 NOTE — Progress Notes (Signed)
Physical Therapy Discharge Summary  Patient Details  Name: Shane Guzman MRN: [id number] Date of Birth: 17-May-1976  Today's Date: 02/28/2021    Patient has met 9 of 9 long term goals due to improved activity tolerance, improved balance, improved postural control, increased strength, decreased pain, ability to compensate for deficits and functional use of  left lower extremity.  Patient to discharge at an ambulatory level Modified Independent.   Patient's care partner is independent to provide the necessary physical assistance at discharge.  Reasons goals not met: n/a  Recommendation:  Patient will benefit from ongoing skilled PT services in home health setting to continue to advance safe functional mobility, address ongoing impairments in standing balance,gait training and minimize fall risk.  Equipment: rolling walker  Reasons for discharge: treatment goals met  Patient/family agrees with progress made and goals achieved: Yes  PT Discharge Precautions/Restrictions Restrictions Weight Bearing Restrictions: No RLE Weight Bearing: Weight bearing as tolerated LLE Weight Bearing: Weight bearing as tolerated Vital Signs  Pain Pain Assessment Pain Scale: 0-10 Pain Score: 8  Pain Type: Acute pain Pain Location: Leg Pain Orientation: Left Pain Descriptors / Indicators: Aching;Burning Pain Frequency: Constant Pain Onset: On-going Patients Stated Pain Goal: 2 Pain Intervention(s): Medication (See eMAR) Multiple Pain Sites: No Vision/Perception     Cognition   Sensation   Motor     Mobility   Locomotion     Trunk/Postural Assessment     Balance   Extremity Assessment             Junie Panning 02/28/2021, 12:17 PM

## 2021-02-26 NOTE — Progress Notes (Signed)
PROGRESS NOTE   Subjective/Complaints: Patient seen sitting up this morning working with therapies and later ambulating with therapies with rolling walker.  He states he did not sleep well overnight due to shooting pain in his left lower extremity.  ROS: Denies CP, SOB, N/V/D  Objective:   No results found. Recent Labs    02/25/21 0544  WBC 6.9  HGB 13.2  HCT 39.1  PLT 252   Recent Labs    02/25/21 0544  NA 135  K 4.1  CL 100  CO2 25  GLUCOSE 120*  BUN 19  CREATININE 1.26*  CALCIUM 9.6    Intake/Output Summary (Last 24 hours) at 02/26/2021 0919 Last data filed at 02/26/2021 0700 Gross per 24 hour  Intake 480 ml  Output 1275 ml  Net -795 ml        Physical Exam: Vital Signs Blood pressure 119/70, pulse 92, temperature 98.8 F (37.1 C), resp. rate 16, height 5\' 5"  (1.651 m), weight 93 kg, SpO2 93 %.   Physical Exam Constitutional: No distress . Vital signs reviewed. HENT: Normocephalic.  Atraumatic. Eyes: EOMI. No discharge. Cardiovascular: No JVD.  RRR. Respiratory: Normal effort.  No stridor.  Bilateral clear to auscultation. GI: Non-distended.  BS +. Skin: Warm and dry.  Intact. Psych: Normal mood.  Normal behavior. Musc: No edema in extremities.  No tenderness in extremities. Neuro: Alert Bilateral upper extremities 5/5 bilaterally, appears unchanged RLE 4/5 distal and prox LLE 3/5 throughout, limited by pain and edema. 0/5 DF and PF   Assessment/Plan: 1. Functional deficits which require 3+ hours per day of interdisciplinary therapy in a comprehensive inpatient rehab setting.  Physiatrist is providing close team supervision and 24 hour management of active medical problems listed below.  Physiatrist and rehab team continue to assess barriers to discharge/monitor patient progress toward functional and medical goals  Care Tool:  Bathing    Body parts bathed by patient: Right arm,Left  arm,Chest,Abdomen,Front perineal area,Buttocks,Right upper leg,Left upper leg,Face,Right lower leg,Left lower leg   Body parts bathed by helper: Left lower leg,Right lower leg     Bathing assist Assist Level: Set up assist     Upper Body Dressing/Undressing Upper body dressing   What is the patient wearing?: Pull over shirt    Upper body assist Assist Level: Set up assist    Lower Body Dressing/Undressing Lower body dressing      What is the patient wearing?: Underwear/pull up,Pants     Lower body assist Assist for lower body dressing: Set up assist     Toileting Toileting    Toileting assist Assist for toileting: Contact Guard/Touching assist     Transfers Chair/bed transfer  Transfers assist     Chair/bed transfer assist level: Supervision/Verbal cueing     Locomotion Ambulation   Ambulation assist      Assist level: Supervision/Verbal cueing Assistive device: Walker-rolling Max distance: 150 ft   Walk 10 feet activity   Assist     Assist level: Supervision/Verbal cueing Assistive device: Walker-rolling   Walk 50 feet activity   Assist Walk 50 feet with 2 turns activity did not occur: Safety/medical concerns  Assist level: Supervision/Verbal cueing Assistive device:  Walker-rolling    Walk 150 feet activity   Assist Walk 150 feet activity did not occur: Safety/medical concerns  Assist level: Supervision/Verbal cueing Assistive device: Walker-rolling    Walk 10 feet on uneven surface  activity   Assist Walk 10 feet on uneven surfaces activity did not occur: Safety/medical concerns   Assist level: Minimal Assistance - Patient > 75% Assistive device: Photographer Will patient use wheelchair at discharge?: No             Wheelchair 50 feet with 2 turns activity    Assist            Wheelchair 150 feet activity     Assist          Blood pressure 119/70, pulse 92,  temperature 98.8 F (37.1 C), resp. rate 16, height 5\' 5"  (1.651 m), weight 93 kg, SpO2 93 %.  Medical Problem List and Plan: 1.   Debility secondary to AKI from combination of volume contraction/pigment nephropathy/acute metabolic encephalopathy from rhabdo.  Cranial CT scan negative  Continue CIR 2.  Antithrombotics: -DVT/anticoagulation: Continue Subcutaneous heparin             -antiplatelet therapy: N/A 3. Pain Management: Robaxin and oxycodone as needed  lidoderm wasn't helpful- stopped it for back pain.   Changed to flexeril 10 mg TID for muscle tightness  Kpad slightly helpful  Increased gabapentin to 600 mg BID on 3/11, increased to 3 times daily on 3/12 4. Mood: Provide emotional support             -antipsychotic agents: N/A 5. Neuropsych: This patient is capable of making decisions on his own behalf. 6. Skin/Wound Care: Routine skin checks 7. Fluids/Electrolytes/Nutrition: Routine in and outs 8.  Left foot drop/left peroneal nerve compression.  Follow-up outpatient nerve conduction study. Will benefit from an AFO- ordered  Cont AFO 9.  Leukocytosis question aspiration:  Resolved  Completed ABX 10.  History of alcohol tobacco use.  Provide counseling- + elevated ethanol level on admit 11.  Abnormal LFTs/transaminitis due to rhabdo: Resolved  12.  Hypertension.    Lopressor twice daily  Controlled on 3/12 13.  AKI from combination of volume contraction/rhabdo.    Creatinine 1.26 on 3/11 (recently 6 given) 14.  Morbid obesity.  BMI 40.57.  Dietary follow-up  3/8- BMI down to 34.60- due to losing fluid- will monitor 15.  Constipation.  MiraLAX daily, Senokot twice daily.  Colace twice daily as needed  Improving 16.  OSA.  Continue CPAP  3/9- refusing CPAP nightly.  3/10- says will not wear- refused to wear at home as well.   17.  History of sigmoid diverticulitis perforation 2012.  Follow-up outpatient 18.  Peripheral edema  Intermittent Lasix given, continue to monitor  amatory setting 19.  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

## 2021-02-26 NOTE — Plan of Care (Signed)
  Problem: Consults Goal: RH GENERAL PATIENT EDUCATION Description: See Patient Education module for education specifics. Outcome: Progressing   Problem: RH BOWEL ELIMINATION Goal: RH STG MANAGE BOWEL WITH ASSISTANCE Description: STG Manage Bowel with Mod I Assistance. Outcome: Progressing   Problem: RH BLADDER ELIMINATION Goal: RH STG MANAGE BLADDER WITH ASSISTANCE Description: STG Manage Bladder With Mod I Assistance Outcome: Progressing   Problem: RH SKIN INTEGRITY Goal: RH STG MAINTAIN SKIN INTEGRITY WITH ASSISTANCE Description: STG Maintain Skin Integrity With Mod I Assistance. Outcome: Progressing   Problem: RH SAFETY Goal: RH STG ADHERE TO SAFETY PRECAUTIONS W/ASSISTANCE/DEVICE Description: STG Adhere to Safety Precautions With Mod I Assistance/Device. Outcome: Progressing   Problem: RH PAIN MANAGEMENT Goal: RH STG PAIN MANAGED AT OR BELOW PT'S PAIN GOAL Description: <3 on a 0-10 pain scale. Outcome: Progressing   Problem: RH KNOWLEDGE DEFICIT GENERAL Goal: RH STG INCREASE KNOWLEDGE OF SELF CARE AFTER HOSPITALIZATION Description: Patient will demonstrate knowledge of medication management, dietary management, safety awareness with educational materials and handouts provided by staff. Outcome: Progressing   Problem: Consults Goal: RH GENERAL PATIENT EDUCATION Description: See Patient Education module for education specifics. Outcome: Progressing   

## 2021-02-27 NOTE — Plan of Care (Signed)
  Problem: RH Bathing Goal: LTG Patient will bathe all body parts with assist levels (OT) Description: LTG: Patient will bathe all body parts with assist levels (OT) Outcome: Not Met (add Reason) Note: Pt requiring setup assist for bathing at this time for safety, friend [name] and father have been educated regarding this   Problem: RH Simple Meal Prep Goal: LTG Patient will perform simple meal prep w/assist (OT) Description: LTG: Patient will perform simple meal prep with assistance, with/without cues (OT). Outcome: Not Met (add Reason) Note: Pt will receive assistance from [name] and family for meal prep needs, goal n/a   Problem: RH Tub/Shower Transfers Goal: LTG Patient will perform tub/shower transfers w/assist (OT) Description: LTG: Patient will perform tub/shower transfers with assist, with/without cues using equipment (OT) Outcome: Not Met (add Reason) Note: Pt requiring setup assist for bathing at this time for safety, friend [name] and father have been educated regarding this   Problem: RH Balance Goal: LTG Patient will maintain dynamic standing with ADLs (OT) Description: LTG:  Patient will maintain dynamic standing balance with assist during activities of daily living (OT)  Outcome: Completed/Met   Problem: Sit to Stand Goal: LTG:  Patient will perform sit to stand in prep for activites of daily living with assistance level (OT) Description: LTG:  Patient will perform sit to stand in prep for activites of daily living with assistance level (OT) Outcome: Completed/Met   Problem: RH Grooming Goal: LTG Patient will perform grooming w/assist,cues/equip (OT) Description: LTG: Patient will perform grooming with assist, with/without cues using equipment (OT) Outcome: Completed/Met   Problem: RH Dressing Goal: LTG Patient will perform upper body dressing (OT) Description: LTG Patient will perform upper body dressing with assist, with/without cues (OT). Outcome:  Completed/Met Goal: LTG Patient will perform lower body dressing w/assist (OT) Description: LTG: Patient will perform lower body dressing with assist, with/without cues in positioning using equipment (OT) Outcome: Completed/Met   Problem: RH Toileting Goal: LTG Patient will perform toileting task (3/3 steps) with assistance level (OT) Description: LTG: Patient will perform toileting task (3/3 steps) with assistance level (OT)  Outcome: Completed/Met   Problem: RH Toilet Transfers Goal: LTG Patient will perform toilet transfers w/assist (OT) Description: LTG: Patient will perform toilet transfers with assist, with/without cues using equipment (OT) Outcome: Completed/Met

## 2021-02-27 NOTE — Plan of Care (Signed)
  Problem: Consults Goal: RH GENERAL PATIENT EDUCATION Description: See Patient Education module for education specifics. Outcome: Progressing   Problem: RH BOWEL ELIMINATION Goal: RH STG MANAGE BOWEL WITH ASSISTANCE Description: STG Manage Bowel with Mod I Assistance. Outcome: Progressing   Problem: RH BLADDER ELIMINATION Goal: RH STG MANAGE BLADDER WITH ASSISTANCE Description: STG Manage Bladder With Mod I Assistance Outcome: Progressing   Problem: RH SKIN INTEGRITY Goal: RH STG MAINTAIN SKIN INTEGRITY WITH ASSISTANCE Description: STG Maintain Skin Integrity With Mod I Assistance. Outcome: Progressing   Problem: RH SAFETY Goal: RH STG ADHERE TO SAFETY PRECAUTIONS W/ASSISTANCE/DEVICE Description: STG Adhere to Safety Precautions With Mod I Assistance/Device. Outcome: Progressing   Problem: RH PAIN MANAGEMENT Goal: RH STG PAIN MANAGED AT OR BELOW PT'S PAIN GOAL Description: <3 on a 0-10 pain scale. Outcome: Progressing   Problem: RH KNOWLEDGE DEFICIT GENERAL Goal: RH STG INCREASE KNOWLEDGE OF SELF CARE AFTER HOSPITALIZATION Description: Patient will demonstrate knowledge of medication management, dietary management, safety awareness with educational materials and handouts provided by staff. Outcome: Progressing   Problem: Consults Goal: RH GENERAL PATIENT EDUCATION Description: See Patient Education module for education specifics. Outcome: Progressing

## 2021-02-27 NOTE — Progress Notes (Signed)
Occupational Therapy Session Note  Patient Details  Name: Shane Guzman MRN: [id number] Date of Birth: 03/08/1976  Today's Date: 02/27/2021 OT Individual Time: 0626-9485 OT Individual Time Calculation (min): 41 min   Short Term Goals: Week 2:  OT Short Term Goal 1 (Week 2): STGs=LTGs set at Mod I overall    Skilled Therapeutic Interventions/Progress Updates:    Pt greeted in bed and premedicated for pain. Agreeable to session, requesting to begin by completing oral care and grooming tasks while seated at the sink. Mod I for ambulatory transfer to the w/c in front of sink using RW. Independent for stated tasks and then Mod I for simulated toileting tasks using RW at ambulatory level. Pt wanted to work on [employer]. Escorted him to the gym via w/c and taught him forward/backward and lateral pendulum exercises while standing in front of elevated table, mini squats, and hip circumduction in clockwise/counter-clockwise directions. While seated, guided pt through gentle yoga-based stretches stretches to loosen up lower back. Discussed therapeutic benefits of stretching the Lt LE in a pool setting once he was given MD clearance to do so. Until then, pt could complete these exercises at a supportive counter at home. Pt agreeable. He was then returned to the room via w/c and completed an ambulatory transfer to EOB using RW. Pt left EOB, set up for lunch with all needs within reach and bed alarm set.   Note that pt was able to don both shoes, including his Lt AFO component, independently today!   Therapy Documentation Precautions:  Precautions Precautions: Fall Precaution Comments: LLE edema/numbness/foot drop, back pain Restrictions Weight Bearing Restrictions: No RLE Weight Bearing: Weight bearing as tolerated LLE Weight Bearing: Weight bearing as tolerated Other Position/Activity Restrictions: "WBAT with functional AFO" per ortho MD note 2/20 Vital Signs: Therapy  Vitals Temp: 98.4 F (36.9 C) Temp Source: Oral Pulse Rate: 82 Resp: 18 BP: 116/79 Patient Position (if appropriate): Lying Oxygen Therapy SpO2: 99 % O2 Device: Room Air Pain: Pain Assessment Pain Score: 2  ADL: ADL Eating: Independent Where Assessed-Eating: Chair Grooming: Modified independent Where Assessed-Grooming: Edge of bed Upper Body Bathing: Modified independent Where Assessed-Upper Body Bathing: Edge of bed Lower Body Bathing: Modified independent Where Assessed-Lower Body Bathing: Edge of bed Upper Body Dressing: Modified independent (Device) Where Assessed-Upper Body Dressing: Edge of bed Lower Body Dressing: Modified independent Where Assessed-Lower Body Dressing: Edge of bed Toileting: Modified independent Where Assessed-Toileting: Bedside Commode Toilet Transfer: Modified independent Statistician Method: Proofreader: Animator Transfer: Modified independent Film/video editor: Not assessed      Therapy/Group: Individual Therapy  Jocob Dambach A Jad Johansson 02/27/2021, 3:12 PM

## 2021-02-27 NOTE — Progress Notes (Signed)
Physical Therapy Session Note  Patient Details  Name: Shane Guzman MRN: [id number] Date of Birth: [birth year]  Today's Date: 02/27/2021 PT Individual Time: 1010-1105 PT Individual Time Calculation (min): 55 min   Short Term Goals: Week 2:  PT Short Term Goal 1 (Week 2): Pt will ascend/descend 12 steps using HRs with SBA PT Short Term Goal 2 (Week 2): Pt will ambulate at least 189ft using LRAD with SBA PT Short Term Goal 3 (Week 2): pt to demonstrate mod I functional transfers with LRAD  Skilled Therapeutic Interventions/Progress Updates:     Patient in bed upon PT arrival. Patient alert and agreeable to PT session. Patient reported 6/10 L lower extremity pain during session, RN made aware. PT provided repositioning, rest breaks, and distraction as pain interventions throughout session.   Performed d/c assessment, see details below, and focused on functional mobility assessment for bed mobility, transfers, gait, stairs, simulated car transfer, and ambulation on unlevel surfaces.   Therapeutic Activity: Bed Mobility: Patient performed rolling R/L and supine to/from sit independently in a flat bed without use of bed rails.  Transfers: Patient performed sit to/from stand independently from multiple household surfaces with use of RW.  Patient performed a simulated Jeep height car transfer with supervision-mod I using RW. Provided min cues for safe technique.  Gait Training:  Patient ambulated >100 ft x2 and >150 ft feet using RW with mod I. Ambulated with antalgic gait with decreased gait speed, decreased step length and height,  increased B hip and knee flexion in L stance, forward trunk lean, and downward head gaze.  Patient ascended/descended 12 steps using B rails, simulating home set-up, with supervision for safety. Performed step-to gait pattern leading with R while ascending and L while descending.   Patient ambulated up/down a ramp, over 10 feet of mulch (unlevel surface), and up/down  a curb to simulate community ambulation over unlevel surfaces with supervision using RW. Provided cues for technique and use of AD.  Therapeutic Exercise: Patient performed the following exercises provided as HEP with handout and online access code with verbal and tactile cues for proper technique. Access Code: [email or web address] Standing Knee Flexion - 2 x daily - 7 x weekly - 3 sets - 10 reps Seated Heel Toe Raises - 2 x daily - 7 x weekly - 3 sets - 10 reps Seated Piriformis Stretch - 3 x daily - 7 x weekly - 1 sets - 2-3 reps - 30-60 sec hold Standing Hip Flexion March - 2 x daily - 7 x weekly - 3 sets - 10 reps  Applied Kinesiotape over L gluteals using lymphatic drainage technique with the aim of edema control. Prior to application, patient denied any history of skin irritation or allergy to adhesive. Educated patient on purpose of kinesiotape placement and signs symptoms of allergic reaction or irritation. Cleaned patient's skin and applied test strip at beginning of session and removed at end of session without sings of skin irritation. Instructed patient that the tape can be left on up to 3 days and can be worn in the shower. Instructed to removed the tape if peeling off, signs of skin irritation arise, or it has been on for >3 days. Informed patient that the tape is best removed in the shower or with a wet wash cloth. Patient stated understanding.   Patient in bed at end of session with breaks locked, bed alarm set, and all needs within reach.    Therapy Documentation Precautions:  Precautions Precautions: Fall Precaution Comments:  LLE edema/numbness/foot drop, back pain Restrictions Weight Bearing Restrictions: No RLE Weight Bearing: Weight bearing as tolerated LLE Weight Bearing: Weight bearing as tolerated Other Position/Activity Restrictions: "WBAT with functional AFO" per ortho MD note 2/20 Sensation Sensation Light Touch: Impaired by gross assessment Peripheral sensation comments:  reports impaired sensation in L foot compared to R Light Touch Impaired Details: Impaired LLE Proprioception Impaired Details: Impaired LLE Coordination Gross Motor Movements are Fluid and Coordinated: No Fine Motor Movements are Fluid and Coordinated: Yes Coordination and Movement Description: Functional mobility limited by peripheral nerve pain and sensory deficits, and L lower extremity edema Extremity Assessment  RLE Assessment RLE Assessment: Within Functional Limits Active Range of Motion (AROM) Comments: WFL General Strength Comments: 5/5 assessed in sitting LLE Assessment LLE Assessment: Exceptions to [employer] [facility] Passive Range of Motion (PROM) Comments: WFL LLE Strength Left Hip Flexion: 4/5 Left Knee Flexion: 4/5 Left Knee Extension: 4+/5 Left Ankle Dorsiflexion: 2-/5 Left Ankle Plantar Flexion: 2-/5  Therapy/Group: Individual Therapy  Marjoria Mancillas L Chara Marquard PT, DPT  02/27/2021, 12:41 PM

## 2021-02-28 MED ORDER — CYCLOBENZAPRINE HCL 10 MG PO TABS: 10.0000 mg | ORAL_TABLET | Freq: Three times a day (TID) | ORAL | 0 refills | Status: AC

## 2021-02-28 MED ORDER — METOPROLOL TARTRATE 25 MG PO TABS: 12.5000 mg | ORAL_TABLET | Freq: Two times a day (BID) | ORAL | 0 refills | Status: AC

## 2021-02-28 MED ORDER — GABAPENTIN 300 MG PO CAPS
600.0000 mg | ORAL_CAPSULE | Freq: Three times a day (TID) | ORAL | 0 refills | Status: DC
Start: 2021-02-28 — End: 2021-03-25

## 2021-02-28 MED ORDER — OXYCODONE HCL 5 MG PO TABS: 5.0000 mg | ORAL_TABLET | ORAL | 0 refills | Status: DC | PRN

## 2021-02-28 MED ORDER — FAMOTIDINE 10 MG PO TABS: 10.0000 mg | ORAL_TABLET | Freq: Every day | ORAL | 0 refills | Status: AC

## 2021-02-28 NOTE — Progress Notes (Signed)
PROGRESS NOTE   Subjective/Complaints:  Pt asking again why he has weakness and pain in LLE- asked again what occurred. Explained he was found down at home and was down so long, it caused muscle breakdown and compression of nerve for LLE- and that's why we are doing EMG/NCS.   ROS: Pt denies SOB, abd pain, CP, N/V/C/D, and vision changes   Objective:   No results found. No results for input(s): WBC, HGB, HCT, PLT in the last 72 hours. No results for input(s): NA, K, CL, CO2, GLUCOSE, BUN, CREATININE, CALCIUM in the last 72 hours.  Intake/Output Summary (Last 24 hours) at 02/28/2021 0810 Last data filed at 02/28/2021 0501 Gross per 24 hour  Intake 777 ml  Output 1575 ml  Net -798 ml        Physical Exam: Vital Signs Blood pressure (!) 127/91, pulse 94, temperature 98.3 F (36.8 C), temperature source Oral, resp. rate 18, height 5\' 5"  (1.651 m), weight 93.5 kg, SpO2 94 %.   Physical Exam  General: awake, alert, appropriate, laying supine, just woke up;  NAD HENT: conjugate gaze; oropharynx moist CV: regular rate; no JVD Pulmonary: CTA B/L; no W/R/R- good air movement GI: soft, NT, ND, (+)BS Psychiatric: distant- lack of interaction with him, as normal, but did ask more questions.  Neurological: Ox3  Musc: no LE edema; L foot drop as prior. Neuro: Alert Bilateral upper extremities 5/5 bilaterally, appears unchanged RLE 4/5 distal and prox LLE 3/5 throughout, limited by pain and edema. 0/5 DF and PF   Assessment/Plan: 1. Functional deficits which require 3+ hours per day of interdisciplinary therapy in a comprehensive inpatient rehab setting.  Physiatrist is providing close team supervision and 24 hour management of active medical problems listed below.  Physiatrist and rehab team continue to assess barriers to discharge/monitor patient progress toward functional and medical goals  Care Tool:  Bathing     Body parts bathed by patient: Right arm,Left arm,Chest,Abdomen,Front perineal area,Buttocks,Right upper leg,Left upper leg,Face,Right lower leg,Left lower leg   Body parts bathed by helper: Left lower leg,Right lower leg     Bathing assist Assist Level: Set up assist (pet most recent staff documentation)     Upper Body Dressing/Undressing Upper body dressing   What is the patient wearing?: Pull over shirt    Upper body assist Assist Level: Independent    Lower Body Dressing/Undressing Lower body dressing      What is the patient wearing?: Underwear/pull up,Pants     Lower body assist Assist for lower body dressing: Independent with assitive device     Toileting Toileting    Toileting assist Assist for toileting: Independent with assistive device     Transfers Chair/bed transfer  Transfers assist     Chair/bed transfer assist level: Independent with assistive device Chair/bed transfer assistive device:   Ambulation assist      Assist level: Independent with assistive device Assistive device: Walker-rolling Max distance: >150 ft   Walk 10 feet activity   Assist     Assist level: Independent with assistive device Assistive device: Walker-rolling   Walk 50 feet activity   Assist Walk 50 feet  with 2 turns activity did not occur: Safety/medical concerns  Assist level: Independent with assistive device Assistive device: Walker-rolling    Walk 150 feet activity   Assist Walk 150 feet activity did not occur: Safety/medical concerns  Assist level: Independent with assistive device Assistive device: Walker-rolling    Walk 10 feet on uneven surface  activity   Assist Walk 10 feet on uneven surfaces activity did not occur: Safety/medical concerns   Assist level: Supervision/Verbal cueing Assistive device: Photographer Will patient use wheelchair at discharge?: No   Wheelchair  activity did not occur: N/A         Wheelchair 50 feet with 2 turns activity    Assist    Wheelchair 50 feet with 2 turns activity did not occur: N/A       Wheelchair 150 feet activity     Assist  Wheelchair 150 feet activity did not occur: N/A       Blood pressure (!) 127/91, pulse 94, temperature 98.3 F (36.8 C), temperature source Oral, resp. rate 18, height 5\' 5"  (1.651 m), weight 93.5 kg, SpO2 94 %.  Medical Problem List and Plan: 1.   Debility secondary to AKI from combination of volume contraction/pigment nephropathy/acute metabolic encephalopathy from rhabdo.  Cranial CT scan negative  Continue CIR 2.  Antithrombotics: -DVT/anticoagulation: Continue Subcutaneous heparin             -antiplatelet therapy: N/A 3. Pain Management: Robaxin and oxycodone as needed  lidoderm wasn't helpful- stopped it for back pain.   Changed to flexeril 10 mg TID for muscle tightness  Kpad slightly helpful  Increased gabapentin to 600 mg BID on 3/11, increased to 3 times daily on 3/12 4. Mood: Provide emotional support             -antipsychotic agents: N/A 5. Neuropsych: This patient is capable of making decisions on his own behalf. 6. Skin/Wound Care: Routine skin checks 7. Fluids/Electrolytes/Nutrition: Routine in and outs 8.  Left foot drop/left peroneal nerve compression.  Follow-up outpatient nerve conduction study. Will benefit from an AFO- ordered  Cont AFO 9.  Leukocytosis question aspiration:  Resolved  Completed ABX 10.  History of alcohol tobacco use.  Provide counseling- + elevated ethanol level on admit 11.  Abnormal LFTs/transaminitis due to rhabdo: Resolved  12.  Hypertension.    Lopressor twice daily  Controlled on 3/12 13.  AKI from combination of volume contraction/rhabdo.    Creatinine 1.26 on 3/11 (recently 6 given) 14.  Morbid obesity.  BMI 40.57.  Dietary follow-up  3/8- BMI down to 34.60- due to losing fluid- will monitor 15.  Constipation.   MiraLAX daily, Senokot twice daily.  Colace twice daily as needed  Improving 16.  OSA.  Continue CPAP  3/9- refusing CPAP nightly.  3/10- says will not wear- refused to wear at home as well.   17.  History of sigmoid diverticulitis perforation 2012.  Follow-up outpatient 18.  Peripheral edema  Intermittent Lasix given, continue to monitor   3/14- resolved 19.  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 Dispo  3/14- went over dx again with pt and explained we need an EMG/NCS to determine prognosis of LLE- will need to f/u with clinic to see when can return to work.   LOS: 17 days A FACE TO FACE EVALUATION WAS PERFORMED  Shane Guzman  Monte Bronder 02/28/2021, 8:10 AM

## 2021-02-28 NOTE — Plan of Care (Signed)
  Problem: Consults Goal: RH GENERAL PATIENT EDUCATION Description: See Patient Education module for education specifics. Outcome: Completed/Met   Problem: RH BOWEL ELIMINATION Goal: RH STG MANAGE BOWEL WITH ASSISTANCE Description: STG Manage Bowel with Mod I Assistance. Outcome: Completed/Met   Problem: RH BLADDER ELIMINATION Goal: RH STG MANAGE BLADDER WITH ASSISTANCE Description: STG Manage Bladder With Mod I Assistance Outcome: Completed/Met   Problem: RH SKIN INTEGRITY Goal: RH STG MAINTAIN SKIN INTEGRITY WITH ASSISTANCE Description: STG Maintain Skin Integrity With Mod I Assistance. Outcome: Completed/Met   Problem: RH SAFETY Goal: RH STG ADHERE TO SAFETY PRECAUTIONS W/ASSISTANCE/DEVICE Description: STG Adhere to Safety Precautions With Mod I Assistance/Device. Outcome: Completed/Met   Problem: RH PAIN MANAGEMENT Goal: RH STG PAIN MANAGED AT OR BELOW PT'S PAIN GOAL Description: <3 on a 0-10 pain scale. Outcome: Completed/Met   Problem: RH KNOWLEDGE DEFICIT GENERAL Goal: RH STG INCREASE KNOWLEDGE OF SELF CARE AFTER HOSPITALIZATION Description: Patient will demonstrate knowledge of medication management, dietary management, safety awareness with educational materials and handouts provided by staff. Outcome: Completed/Met   Problem: Consults Goal: RH GENERAL PATIENT EDUCATION Description: See Patient Education module for education specifics. Outcome: Completed/Met

## 2021-02-28 NOTE — Progress Notes (Signed)
Patient discharge with Girlfriend to home. Patient stated that he understood all discharge instructions. Cletis Media, LPN

## 2021-03-03 DIAGNOSIS — I10 Essential (primary) hypertension: Secondary | ICD-10-CM | POA: Diagnosis not present

## 2021-03-03 DIAGNOSIS — N179 Acute kidney failure, unspecified: Secondary | ICD-10-CM | POA: Diagnosis not present

## 2021-03-06 ENCOUNTER — Encounter (HOSPITAL_COMMUNITY): Payer: Self-pay | Admitting: Emergency Medicine

## 2021-03-06 ENCOUNTER — Emergency Department (HOSPITAL_COMMUNITY)
Admission: EM | Admit: 2021-03-06 | Discharge: 2021-03-06 | Disposition: A | Discharge status: Home / Self Care | Payer: BC Managed Care – PPO | Attending: Emergency Medicine | Admitting: Emergency Medicine

## 2021-03-06 ENCOUNTER — Other Ambulatory Visit: Payer: Self-pay

## 2021-03-06 DIAGNOSIS — I10 Essential (primary) hypertension: Secondary | ICD-10-CM | POA: Insufficient documentation

## 2021-03-06 DIAGNOSIS — G5792 Unspecified mononeuropathy of left lower limb: Secondary | ICD-10-CM | POA: Insufficient documentation

## 2021-03-06 DIAGNOSIS — M79605 Pain in left leg: Secondary | ICD-10-CM | POA: Diagnosis not present

## 2021-03-06 DIAGNOSIS — R000 Tachycardia, unspecified: Secondary | ICD-10-CM | POA: Diagnosis not present

## 2021-03-06 DIAGNOSIS — F17210 Nicotine dependence, cigarettes, uncomplicated: Secondary | ICD-10-CM | POA: Insufficient documentation

## 2021-03-06 DIAGNOSIS — Z79899 Other long term (current) drug therapy: Secondary | ICD-10-CM | POA: Diagnosis not present

## 2021-03-06 HISTORY — DX: Rhabdomyolysis: M62.82

## 2021-03-06 LAB — CBC
HCT: 42.6 % (ref 39.0–52.0)
Hemoglobin: 14.4 g/dL (ref 13.0–17.0)
MCH: 33.3 pg (ref 26.0–34.0)
MCHC: 33.8 g/dL (ref 30.0–36.0)
MCV: 98.6 fL (ref 80.0–100.0)
Platelets: 204 10*3/uL (ref 150–400)
RBC: 4.32 MIL/uL (ref 4.22–5.81)
RDW: 11.3 % — ABNORMAL LOW (ref 11.5–15.5)
WBC: 6 10*3/uL (ref 4.0–10.5)
nRBC: 0 % (ref 0.0–0.2)

## 2021-03-06 LAB — BASIC METABOLIC PANEL
Anion gap: 8 (ref 5–15)
BUN: 11 mg/dL (ref 6–20)
CO2: 27 mmol/L (ref 22–32)
Calcium: 9.9 mg/dL (ref 8.9–10.3)
Chloride: 103 mmol/L (ref 98–111)
Creatinine, Ser: 1.12 mg/dL (ref 0.61–1.24)
GFR, Estimated: >60 mL/min (ref >60)
Glucose, Bld: 166 mg/dL — ABNORMAL HIGH (ref 70–99)
Potassium: 3.9 mmol/L (ref 3.5–5.1)
Sodium: 138 mmol/L (ref 135–145)

## 2021-03-06 LAB — CK: Total CK: 351 U/L (ref 49–397)

## 2021-03-06 MED ORDER — OXYCODONE HCL 5 MG PO TABS: 5.0000 mg | ORAL_TABLET | ORAL | 0 refills | Status: AC | PRN

## 2021-03-06 NOTE — ED Provider Notes (Signed)
[facility] EMERGENCY DEPARTMENT Provider Note   CSN: [id number] Arrival date & time: 03/06/21  1509     History Chief Complaint  Patient presents with  . Leg Pain  . Foot Pain    Shane Guzman is a 45 y.o. male.  Patient is a 45 year old male with history of hypertension, sleep apnea, recent event where he syncopized with half of his body on the bed in his legs off the bed resulting in severe rhabdomyolysis, acute kidney injury and left foot drop who is presenting today with worsening pain in his left leg and foot.  Patient reports his foot is burning and it has been present for the last 24 hours.  The foot drop is ongoing but he denies any new weakness in his leg.  There is been no swelling.  His urine remains clear he has no chest pain, shortness of breath or other complaints at this time.  He did finish the oxycodone yesterday that he was prescribed from the hospital but is still taking the gabapentin and ibuprofen.  Because the pain was worsening he was concerned there was something else going on.  He denies any bowel or bladder issues.  He is still been able to ambulate.  The history is provided by the patient and medical records.  Leg Pain Foot Pain       Past Medical History:  Diagnosis Date  . Diverticulitis of intestine with abscess   . Heart murmur   . Hypertension   . OSA on CPAP   . Rhabdomyolysis     Patient Active Problem List   Diagnosis Date Noted  . Neuropathic pain   . Drug induced constipation   . Left foot drop   . AKI (acute kidney injury) (HCC)   . Debility 02/11/2021  . Acute renal failure (ARF) (HCC) 02/04/2021  . Diverticulitis 06/13/2015  . Sigmoid diverticulitis 12/06/2011  . Diverticulitis of colon with perforation 11/22/2011  . TOBACCO USER 09/04/2009  . DIVERTICULAR DISEASE 11/18/2007  . MURMUR 11/18/2007    Past Surgical History:  Procedure Laterality Date  . ACHILLES TENDON REPAIR  06/2010   left; S/P tendon  rupture       Family History  Problem Relation Age of Onset  . Diverticulitis Maternal Grandmother     Social History   Tobacco Use  . Smoking status: Current Every Day Smoker    Packs/day: 0.00    Years: 18.00    Pack years: 0.00    Types: Cigarettes  . Smokeless tobacco: Never Used  Substance Use Topics  . Alcohol use: Yes  . Drug use: No    Home Medications Prior to Admission medications   Medication Sig Start Date End Date Taking? Authorizing Provider  cyclobenzaprine (FLEXERIL) 10 MG tablet Take 1 tablet (10 mg total) by mouth 3 (three) times daily. 02/28/21   Angiulli, Mcarthur Rossetti, PA-C  famotidine (PEPCID) 10 MG tablet Take 1 tablet (10 mg total) by mouth daily. 02/28/21   Angiulli, Mcarthur Rossetti, PA-C  gabapentin (NEURONTIN) 300 MG capsule Take 2 capsules (600 mg total) by mouth 3 (three) times daily. 02/28/21   Angiulli, Mcarthur Rossetti, PA-C  metoprolol tartrate (LOPRESSOR) 25 MG tablet Take 0.5 tablets (12.5 mg total) by mouth 2 (two) times daily. 02/28/21   Angiulli, Mcarthur Rossetti, PA-C  oxyCODONE (OXY IR/ROXICODONE) 5 MG immediate release tablet Take 1-2 tablets (5-10 mg total) by mouth every 4 (four) hours as needed for severe pain. 02/28/21   Mariam Dollar  J, PA-C  polyethylene glycol (MIRALAX / GLYCOLAX) 17 g packet Take 17 g by mouth daily. 02/12/21   Lanae Boast, MD    Allergies    Lisinopril  Review of Systems   Review of Systems  All other systems reviewed and are negative.   Physical Exam Updated Vital Signs BP (!) 136/97   Pulse (!) 105   Temp 98.4 F (36.9 C) (Oral)   Resp 19   Ht 5\' 5"  (1.651 m)   Wt 94.8 kg   SpO2 96%   BMI 34.78 kg/m   Physical Exam Vitals and nursing note reviewed.  Constitutional:      General: He is not in acute distress.    Appearance: He is well-developed.  HENT:     Head: Normocephalic and atraumatic.  Eyes:     Conjunctiva/sclera: Conjunctivae normal.     Pupils: Pupils are equal, round, and reactive to light.   Cardiovascular:     Rate and Rhythm: Regular rhythm. Tachycardia present.     Pulses: Normal pulses.     Heart sounds: No murmur heard.   Pulmonary:     Effort: Pulmonary effort is normal. No respiratory distress.     Breath sounds: Normal breath sounds. No wheezing or rales.  Abdominal:     General: There is no distension.     Palpations: Abdomen is soft.     Tenderness: There is no abdominal tenderness. There is no guarding or rebound.  Musculoskeletal:        General: No tenderness. Normal range of motion.     Cervical back: Normal range of motion and neck supple.     Right lower leg: No edema.     Left lower leg: No edema.     Comments: Left foot drop but 5 out of 5 strength in quadricep flexion and extension.  Calf strength is 5 out of 5.  No left leg swelling, calf pain or medial thigh tenderness.  Skin:    General: Skin is warm and dry.     Findings: No erythema or rash.  Neurological:     General: No focal deficit present.     Mental Status: He is alert and oriented to person, place, and time. Mental status is at baseline.     Sensory: No sensory deficit.     Motor: No weakness.  Psychiatric:        Mood and Affect: Mood normal.        Behavior: Behavior normal.      ED Results / Procedures / Treatments   Labs (all labs ordered are listed, but only abnormal results are displayed) Labs Reviewed  CBC - Abnormal; Notable for the following components:      Result Value   RDW 11.3 (*)    All other components within normal limits  BASIC METABOLIC PANEL - Abnormal; Notable for the following components:   Glucose, Bld 166 (*)    All other components within normal limits  CK    EKG None  Radiology No results found.  Procedures Procedures   Medications Ordered in ED Medications - No data to display  ED Course  I have reviewed the triage vital signs and the nursing notes.  Pertinent labs & imaging results that were available during my care of the patient  were reviewed by me and considered in my medical decision making (see chart for details).    MDM Rules/Calculators/A&P  Patient is a 45 year old male presenting to to worsening pain in the left foot.  He describes the pain as burning.  He has had pain since hospitalized with having rhabdomyolysis when he was found in unusual position after passing out.  CK levels were extremely high as well as AKI which had resolved.  He was discharged several days ago but reports he ran out of oxycodone yesterday and has continued to take the gabapentin and ibuprofen but the pain in the left foot has been getting worse that he thought that the rhabdomyolysis was happening again.  Patient CK today is normal at 351, CBC within normal limits and BMP with normal renal function.  Suspect patient is just having worsening neuropathic pain may be a combination of patient starting to heal versus running out of pain medication.  Did encourage patient to increase his gabapentin to 3 times a day but he also may need to increase to 600 mg.  Given a short refill prescription of his oxycodone as that is the only narcotic prescription he has filled in the last year.  He appeared to be taking it appropriately.  Encouraged him to follow-up with Dr. Allena Katz his rehabilitation doctor.  MDM Number of Diagnoses or Management Options   Amount and/or Complexity of Data Reviewed Clinical lab tests: ordered and reviewed Independent visualization of images, tracings, or specimens: yes   Final Clinical Impression(s) / ED Diagnoses Final diagnoses:  Neuropathy of left lower extremity    Rx / DC Orders ED Discharge Orders         Ordered    oxyCODONE (OXY IR/ROXICODONE) 5 MG immediate release tablet  Every 4 hours PRN        03/06/21 1919           Gwyneth Sprout, MD 03/06/21 1927

## 2021-03-06 NOTE — ED Triage Notes (Signed)
Pt states he was in hospital for 2 weeks for rhabdomyolysis.  States he was discharged 1 week ago and is having burning to L leg and L foot.

## 2021-03-06 NOTE — Discharge Instructions (Signed)
All the blood work looked good today.  The pain you are experiencing is related to nerve pain and everything waking up in your foot.  Continue to take the gabapentin.  You are taking 300 mg 2 times a day you can increase it to 3 times a day to help with nerve pain.  Please let your doctors know that the pain is not being controlled and they may want to increase it or change to a different medication.

## 2021-03-06 NOTE — ED Notes (Signed)
Dr.Plunkett notified of pt and orders received.

## 2021-03-08 ENCOUNTER — Ambulatory Visit: Payer: BC Managed Care – PPO

## 2021-03-09 ENCOUNTER — Other Ambulatory Visit: Payer: Self-pay

## 2021-03-09 ENCOUNTER — Ambulatory Visit: Payer: BC Managed Care – PPO | Attending: Physician Assistant | Admitting: Physical Therapy

## 2021-03-09 DIAGNOSIS — R2681 Unsteadiness on feet: Secondary | ICD-10-CM | POA: Insufficient documentation

## 2021-03-09 DIAGNOSIS — R269 Unspecified abnormalities of gait and mobility: Secondary | ICD-10-CM | POA: Insufficient documentation

## 2021-03-09 DIAGNOSIS — M6281 Muscle weakness (generalized): Secondary | ICD-10-CM | POA: Insufficient documentation

## 2021-03-09 NOTE — Therapy (Signed)
[facility] [address] [place], [place], [address] Phone: [phone]   Fax:  [phone]  Physical Therapy Evaluation  Patient Details  Name: Shane Guzman MRN: [id number] Date of Birth: 10/12/1976 Referring Provider (PT): Charlton Amor, PA-C   Encounter Date: 03/09/2021   PT End of Session - 03/09/21 1428    Visit Number 1    Number of Visits 13    Date for PT Re-Evaluation 04/22/21    Authorization Type BCBS    PT Start Time 1428    PT Stop Time 1503    PT Time Calculation (min) 35 min    Activity Tolerance Patient tolerated treatment well;No increased pain    Behavior During Therapy WFL for tasks assessed/performed           Past Medical History:  Diagnosis Date  . Diverticulitis of intestine with abscess   . Heart murmur   . Hypertension   . OSA on CPAP   . Rhabdomyolysis     Past Surgical History:  Procedure Laterality Date  . ACHILLES TENDON REPAIR  06/2010   left; S/P tendon rupture    There were no vitals filed for this visit.    Subjective Assessment - 03/09/21 1432    Subjective Pt is a pleasant 45 y/o M who presents to PT with reports of severe distal L LE pain and paresthesias. He was found unresponsive on 02/04/21 and admitted to acute care services. Pt was then discharged to inpatient rehab with dx of Rhabdomyolysis in L LE. Pt completed course of inpatient therapy and discharged home on 02/28/21. He notes that he has been independent in all ADLs, but continues to have significant distal L LE pain/parethesias and recently went to ED services d/t severe pain.    Limitations Standing;Walking    How long can you sit comfortably? Constant pain    How long can you stand comfortably? Constant pain    How long can you walk comfortably? Constant pain - ambulates with AFO and FWW    Currently in Pain? Yes    Pain Score 8     Pain Location Leg    Pain Orientation Left    Pain Descriptors /  Indicators Burning;Constant    Pain Type Acute pain    Pain Onset More than a month ago    Pain Frequency Constant    Aggravating Factors  stairs, walking, dressing, having tactile sensation against it    Pain Relieving Factors None - denies medication helpful    Multiple Pain Sites No              [facility] PT Assessment - 03/09/21 0001      Assessment   Medical Diagnosis R53.81 (ICD-10-CM) - Other malaise    Referring Provider (PT) Charlton Amor, PA-C    Hand Dominance Right    Next MD Visit 03/25/21      Precautions   Precautions Fall   AFO for L LE     Restrictions   Weight Bearing Restrictions --   WBAT with functional AFO     Balance Screen   Has the patient fallen in the past 6 months Yes    Has the patient had a decrease in activity level because of a fear of falling?  No    Is the patient reluctant to leave their home because of a fear of falling?  No      Home Tourist information centre manager residence  Living Arrangements Alone    Type of Home Apartment    Home Access Stairs to enter    Entrance Stairs-Number of Steps 13    Entrance Stairs-Rails Right;Left;Can reach both    Home Layout One level    Home Equipment Walker - 2 wheels      Prior Function   Level of Independence Independent    Vocation Requirements --   Production designer, theatre/television/film at [employer]     Observation/Other Assessments   Focus on Therapeutic Outcomes (FOTO)  38      ROM / Strength   AROM / PROM / Strength AROM;PROM;Strength      AROM   AROM Assessment Site --    Right/Left Ankle --      Strength   Right Ankle Dorsiflexion 5/5    Right Ankle Plantar Flexion 5/5    Right Ankle Inversion 4+/5    Right Ankle Eversion 4+/5    Left Ankle Dorsiflexion 2+/5    Left Ankle Plantar Flexion 2+/5    Left Ankle Inversion 2+/5    Left Ankle Eversion 2+/5      Palpation   Palpation comment --   TTP to L distal medial/lateral malleolus, L calf     Ambulation/Gait   Ambulation Distance  (Feet) --   51ft   Assistive device Rolling walker    Gait Pattern Step-to pattern;Decreased stance time - left;Decreased step length - left;Decreased dorsiflexion - left   AFO donned L LE   Ambulation Surface Level                      Objective measurements completed on examination: See above findings.       [facility] Adult PT Treatment/Exercise - 03/09/21 0001      Ankle Exercises: Supine   T-Band --   DF/PF/Inv/Ev yellow tband x 15 ea                 PT Education - 03/09/21 1528    Education Details Provided education on condition, POC, and HEP    Person(s) Educated Patient    Methods Explanation;Demonstration;Handout    Comprehension Verbalized understanding;Returned demonstration            PT Short Term Goals - 03/09/21 1537      PT SHORT TERM GOAL #1   Title Pt will be knowledgeable and compliant with HEP in order improve carryover between sessions    Baseline Compliant with some HEP from inpatient rehab    Time 3    Period Weeks    Status New    Target Date 03/30/21      PT SHORT TERM GOAL #2   Title Pt will be able to ambulate 544ft with least restrictive AD and no increase in L LE pain in order to improve comfort and functional ability    Baseline unable to ambulate without FWW and increased L LE pain at present    Time 3    Period Weeks    Status New    Target Date 03/30/21             PT Long Term Goals - 03/09/21 1539      PT LONG TERM GOAL #1   Title Pt will improve R ankle DF/PF strength to no less than 3+/5 in order to improve mobility and safety    Baseline see flowsheet    Time 6    Period Weeks    Status New  Target Date 04/22/21      PT LONG TERM GOAL #2   Title Pt will be able to ambulate 1539ft with no increase in L LE pain and least restrictive AD in order to improve safety and activity tolerance.    Baseline unable to ambulate without FWW and increased L LE pain at present    Time 6    Period Weeks    Status  New    Target Date 04/22/21      PT LONG TERM GOAL #3   Title Pt will improve FOTO score to 58 in order to improve confidence and functional ability to perform ADLs and community navigation.    Baseline 6    Time 6    Period Weeks    Status New    Target Date 04/22/21                  Plan - 03/09/21 1531    Clinical Impression Statement Pt is a pleasant 45 y/o M who presents to PT with reports of severe distal L LE pain and paresthesias. Physical findings are consistent with condition and treatment timeline, with pt demonstrating decreased distal L LE muscle strength along with impaired gait and reduced functional activity tolerance. His FOTO score of 36 indicates decreased functional ability below age related norms and shows he is operating well below his PLOF. Pt would benefit from skilled PT services working on improving L LE muscle strength and normalizing gait in order to decrease pain and improve funcitonal mobility.    Stability/Clinical Decision Making Stable/Uncomplicated    Clinical Decision Making Low    Rehab Potential Good    PT Frequency 2x / week    PT Duration 6 weeks    PT Treatment/Interventions ADLs/Self Care Home Management;Electrical Stimulation;Gait training;Therapeutic activities;Therapeutic exercise;Neuromuscular re-education;Manual techniques;Passive range of motion;Stair training;Moist Heat;Ultrasound;Taping;Joint Manipulations    PT Next Visit Plan Next session should include physical performance measure (30 Sec Chair Rise), assessment of standing balance with narrow BoS, and progression of LE strengthening exercises    PT Home Exercise Plan Initial HEP issued- [id number]    Consulted and Agree with Plan of Care Patient           Patient will benefit from skilled therapeutic intervention in order to improve the following deficits and impairments:  Abnormal gait,Decreased activity tolerance,Decreased balance,Decreased mobility,Decreased strength,Decreased  range of motion,Decreased endurance,Pain  Visit Diagnosis: Muscle weakness (generalized)  Impaired gait  Unsteadiness on feet   Problem List Patient Active Problem List   Diagnosis Date Noted  . Neuropathic pain   . Drug induced constipation   . Left foot drop   . AKI (acute kidney injury) (HCC)   . Debility 02/11/2021  . Acute renal failure (ARF) (HCC) 02/04/2021  . Diverticulitis 06/13/2015  . Sigmoid diverticulitis 12/06/2011  . Diverticulitis of colon with perforation 11/22/2011  . TOBACCO USER 09/04/2009  . DIVERTICULAR DISEASE 11/18/2007  . MURMUR 11/18/2007    Eloy End, PT, DPT 03/09/21 5:19 PM  [facility] [address] [place], [place], [address] Phone: [phone]   Fax:  [phone]  Name: Shane Guzman MRN: [id number] Date of Birth: [birth year]

## 2021-03-15 ENCOUNTER — Other Ambulatory Visit: Payer: Self-pay

## 2021-03-15 ENCOUNTER — Ambulatory Visit: Payer: BC Managed Care – PPO

## 2021-03-15 DIAGNOSIS — M6281 Muscle weakness (generalized): Secondary | ICD-10-CM

## 2021-03-15 DIAGNOSIS — R269 Unspecified abnormalities of gait and mobility: Secondary | ICD-10-CM

## 2021-03-15 DIAGNOSIS — R2681 Unsteadiness on feet: Secondary | ICD-10-CM | POA: Diagnosis not present

## 2021-03-15 NOTE — Therapy (Signed)
[facility] [address] [place], [place], [address] Phone: [phone]   Fax:  [phone]  Physical Therapy Treatment  Patient Details  Name: Shane Guzman MRN: [id number] Date of Birth: 1976/02/24 Referring Provider (PT): Charlton Amor, PA-C   Encounter Date: 03/15/2021   PT End of Session - 03/15/21 0905    Visit Number 2    Number of Visits 13    Date for PT Re-Evaluation 04/22/21    Authorization Type BCBS    PT Start Time 0906    PT Stop Time 0944    PT Time Calculation (min) 38 min    Activity Tolerance Patient tolerated treatment well;No increased pain    Behavior During Therapy WFL for tasks assessed/performed           Past Medical History:  Diagnosis Date  . Diverticulitis of intestine with abscess   . Heart murmur   . Hypertension   . OSA on CPAP   . Rhabdomyolysis     Past Surgical History:  Procedure Laterality Date  . ACHILLES TENDON REPAIR  06/2010   left; S/P tendon rupture    There were no vitals filed for this visit.   Subjective Assessment - 03/15/21 0906    Subjective Pt presents to PT with continued reports of distal L LE pain and discomfort. He notes that he had to take off the AFO due to compression of his L foot. Pt has been compliant with his HEP with no adverse effect. He is ready to begin PT treatment this time.    Currently in Pain? Yes    Pain Score 7     Pain Location Ankle    Pain Orientation Left    Pain Descriptors / Indicators Burning    Pain Type Chronic pain    Pain Radiating Towards L foot                             [facility] Adult PT Treatment/Exercise - 03/15/21 0001      Knee/Hip Exercises: Aerobic   Nustep lvl 5 x 5 min while taking subjective      Knee/Hip Exercises: Supine   Bridges 3 sets;10 reps    Straight Leg Raises 3 sets;10 reps      Ankle Exercises: Seated   Heel Raises Other (comment)   2x25   BAPS Level 2   x 20 circles  cw/ccw     Ankle Exercises: Supine   T-Band --   L ankle DF/PF/inv/ev 2x10 YTB                 PT Education - 03/15/21 0950    Education Details education on new HEP exercises    Person(s) Educated Patient    Methods Explanation;Demonstration;Handout    Comprehension Verbalized understanding;Returned demonstration            PT Short Term Goals - 03/09/21 1537      PT SHORT TERM GOAL #1   Title Pt will be knowledgeable and compliant with HEP in order improve carryover between sessions    Baseline Compliant with some HEP from inpatient rehab    Time 3    Period Weeks    Status New    Target Date 03/30/21      PT SHORT TERM GOAL #2   Title Pt will be able to ambulate 556ft with least restrictive AD and no increase in L LE pain in  order to improve comfort and functional ability    Baseline unable to ambulate without FWW and increased L LE pain at present    Time 3    Period Weeks    Status New    Target Date 03/30/21             PT Long Term Goals - 03/09/21 1539      PT LONG TERM GOAL #1   Title Pt will improve R ankle DF/PF strength to no less than 3+/5 in order to improve mobility and safety    Baseline see flowsheet    Time 6    Period Weeks    Status New    Target Date 04/22/21      PT LONG TERM GOAL #2   Title Pt will be able to ambulate 1558ft with no increase in L LE pain and least restrictive AD in order to improve safety and activity tolerance.    Baseline unable to ambulate without FWW and increased L LE pain at present    Time 6    Period Weeks    Status New    Target Date 04/22/21      PT LONG TERM GOAL #3   Title Pt will improve FOTO score to 58 in order to improve confidence and functional ability to perform ADLs and community navigation.    Baseline 6    Time 6    Period Weeks    Status New    Target Date 04/22/21                 Plan - 03/15/21 0911    Clinical Impression Statement Pt was able to complete all prescribed  therapeutic exercises with no increase in pain or adverse effect. He continues to show distal L LE muscle weakness and impaired motor control. Pt also demonstrates reduced general functional activitiy tolerance and fatigues quickly with strengthening exercises. PT will continue to progress LE strengthening exercises as able per POC as prescribed.    PT Treatment/Interventions ADLs/Self Care Home Management;Electrical Stimulation;Gait training;Therapeutic activities;Therapeutic exercise;Neuromuscular re-education;Manual techniques;Passive range of motion;Stair training;Moist Heat;Ultrasound;Taping;Joint Manipulations    PT Next Visit Plan Physical performance measure (30 Sec Chair Rise), assessment of standing balance with narrow BoS, and progression of distal LE strengthening    PT Home Exercise Plan HEP updated - [id number]           Patient will benefit from skilled therapeutic intervention in order to improve the following deficits and impairments:  Abnormal gait,Decreased activity tolerance,Decreased balance,Decreased mobility,Decreased strength,Decreased range of motion,Decreased endurance,Pain  Visit Diagnosis: Muscle weakness (generalized)  Impaired gait  Unsteadiness on feet     Problem List Patient Active Problem List   Diagnosis Date Noted  . Neuropathic pain   . Drug induced constipation   . Left foot drop   . AKI (acute kidney injury) (HCC)   . Debility 02/11/2021  . Acute renal failure (ARF) (HCC) 02/04/2021  . Diverticulitis 06/13/2015  . Sigmoid diverticulitis 12/06/2011  . Diverticulitis of colon with perforation 11/22/2011  . TOBACCO USER 09/04/2009  . DIVERTICULAR DISEASE 11/18/2007  . MURMUR 11/18/2007   Eloy End, PT, DPT 03/15/21 9:54 AM  [facility] [address] [place], [place], [address] Phone: [phone]   Fax:  [phone]  Name: Shane Guzman MRN: [id number] Date of Birth:  Apr 12, 1976

## 2021-03-16 DIAGNOSIS — G5732 Lesion of lateral popliteal nerve, left lower limb: Secondary | ICD-10-CM | POA: Diagnosis not present

## 2021-03-17 ENCOUNTER — Ambulatory Visit: Payer: BC Managed Care – PPO

## 2021-03-17 ENCOUNTER — Other Ambulatory Visit: Payer: Self-pay

## 2021-03-17 ENCOUNTER — Telehealth: Payer: Self-pay

## 2021-03-17 DIAGNOSIS — R269 Unspecified abnormalities of gait and mobility: Secondary | ICD-10-CM

## 2021-03-17 DIAGNOSIS — R2681 Unsteadiness on feet: Secondary | ICD-10-CM | POA: Diagnosis not present

## 2021-03-17 DIAGNOSIS — M6281 Muscle weakness (generalized): Secondary | ICD-10-CM | POA: Diagnosis not present

## 2021-03-17 MED ORDER — LEVETIRACETAM 500 MG PO TABS: 500.0000 mg | ORAL_TABLET | Freq: Two times a day (BID) | ORAL | 5 refills | Status: DC

## 2021-03-17 NOTE — Therapy (Signed)
[facility] [address] [place], [place], [address] Phone: [phone]   Fax:  [phone]  Physical Therapy Treatment  Patient Details  Name: Shane Guzman MRN: [id number] Date of Birth: 01-Feb-1976 Referring Provider (PT): Charlton Amor, PA-C   Encounter Date: 03/17/2021   PT End of Session - 03/17/21 0908    Visit Number 3    Number of Visits 13    Date for PT Re-Evaluation 04/22/21    Authorization Type BCBS    PT Start Time 0910    PT Stop Time 0948    PT Time Calculation (min) 38 min           Past Medical History:  Diagnosis Date  . Diverticulitis of intestine with abscess   . Heart murmur   . Hypertension   . OSA on CPAP   . Rhabdomyolysis     Past Surgical History:  Procedure Laterality Date  . ACHILLES TENDON REPAIR  06/2010   left; S/P tendon rupture    There were no vitals filed for this visit.   Subjective Assessment - 03/17/21 0909    Subjective Pt presents to PT with continued reports of L LE pain. He notes that wearing his shoe on his L foot is compressing his foot, even though he has gotten a size shoe that is larger. Pt has been compliant with his HEP with no adverse effect. Pt is ready to begin PT treatment at this time.    Currently in Pain? Yes    Pain Score 7     Pain Location Ankle    Pain Orientation Left    Pain Descriptors / Indicators Burning;Aching                             [facility] Adult PT Treatment/Exercise - 03/17/21 0001      Ambulation/Gait   Ambulation/Gait Yes    Ambulation/Gait Assistance 5: Supervision    Ambulation/Gait Assistance Details PT CGA and VCs for SPC sequencing and increasing L heel strike    Ambulation Distance (Feet) 150 Feet    Assistive device Straight cane    Gait Pattern Left circumduction;Decreased dorsiflexion - left      Knee/Hip Exercises: Aerobic   Nustep lvl 5 LE only x 5 min wihle taking subjective      Knee/Hip  Exercises: Standing   Heel Raises 2 sets;10 reps   UE support   Lateral Step Up 2 sets;10 reps;Left    Forward Step Up 2 sets;10 reps;Left      Knee/Hip Exercises: Seated   Sit to Sand 3 sets;10 reps      Ankle Exercises: Seated   ABC's 1 rep   L foot/ankle   Ankle Circles/Pumps 20 reps   2 sets   Heel Raises --   2x25   Other Seated Ankle Exercises --   ankle DF/PF/inv/ev 3x10 RTB ea                 PT Education - 03/17/21 0955    Education Details education on [facility] use during gait    Person(s) Educated Patient    Methods Explanation;Demonstration    Comprehension Verbalized understanding;Returned demonstration;Need further instruction;Verbal cues required            PT Short Term Goals - 03/09/21 1537      PT SHORT TERM GOAL #1   Title Pt will be knowledgeable and compliant with HEP in  order improve carryover between sessions    Baseline Compliant with some HEP from inpatient rehab    Time 3    Period Weeks    Status New    Target Date 03/30/21      PT SHORT TERM GOAL #2   Title Pt will be able to ambulate 53ft with least restrictive AD and no increase in L LE pain in order to improve comfort and functional ability    Baseline unable to ambulate without FWW and increased L LE pain at present    Time 3    Period Weeks    Status New    Target Date 03/30/21             PT Long Term Goals - 03/09/21 1539      PT LONG TERM GOAL #1   Title Pt will improve R ankle DF/PF strength to no less than 3+/5 in order to improve mobility and safety    Baseline see flowsheet    Time 6    Period Weeks    Status New    Target Date 04/22/21      PT LONG TERM GOAL #2   Title Pt will be able to ambulate 1535ft with no increase in L LE pain and least restrictive AD in order to improve safety and activity tolerance.    Baseline unable to ambulate without FWW and increased L LE pain at present    Time 6    Period Weeks    Status New    Target Date 04/22/21      PT  LONG TERM GOAL #3   Title Pt will improve FOTO score to 58 in order to improve confidence and functional ability to perform ADLs and community navigation.    Baseline 6    Time 6    Period Weeks    Status New    Target Date 04/22/21                 Plan - 03/17/21 0913    Clinical Impression Statement Pt was able to complete all prescribed exerciess and shows slightly improved motor control for distal L LE. He continues to struggle with L DF during gait and increased senstivity to L foot with shoes and socks on. Pt was able to ambulate with SPC today, but continues to be at an increased risk for falls d/t L foot drop. PT advised using SPC only for short distances around home and to continue to use AFO, as he has been taking it off d/t pain. He continues to benefit from skilled PT services and should continue to be seen per POC as prescribed.    PT Treatment/Interventions ADLs/Self Care Home Management;Electrical Stimulation;Gait training;Therapeutic activities;Therapeutic exercise;Neuromuscular re-education;Manual techniques;Passive range of motion;Stair training;Moist Heat;Ultrasound;Taping;Joint Manipulations    PT Next Visit Plan continue to progress gait and LE strengthening as tolerated    PT Home Exercise Plan HEP updated - [id number]           Patient will benefit from skilled therapeutic intervention in order to improve the following deficits and impairments:  Abnormal gait,Decreased activity tolerance,Decreased balance,Decreased mobility,Decreased strength,Decreased range of motion,Decreased endurance,Pain  Visit Diagnosis: Muscle weakness (generalized)  Impaired gait  Unsteadiness on feet     Problem List Patient Active Problem List   Diagnosis Date Noted  . Neuropathic pain   . Drug induced constipation   . Left foot drop   . AKI (acute kidney injury) (HCC)   . Debility 02/11/2021  .  Acute renal failure (ARF) (HCC) 02/04/2021  . Diverticulitis 06/13/2015  .  Sigmoid diverticulitis 12/06/2011  . Diverticulitis of colon with perforation 11/22/2011  . TOBACCO USER 09/04/2009  . DIVERTICULAR DISEASE 11/18/2007  . MURMUR 11/18/2007    Eloy End, PT, DPT 03/17/21 9:56 AM  [facility] [address] [place], [place], [address] Phone: [phone]   Fax:  [phone]  Name: Shane Guzman MRN: [id number] Date of Birth: Jan 15, 1976

## 2021-03-17 NOTE — Telephone Encounter (Addendum)
Mr. Shane Guzman complains of left foot pain and swelling. Per patient the left foot has been swelling since he left the hospital. For 2 days the foot has a level 10/10  Burning pain.  He said it is also dark in color.    Patient is aware you are not in the office. I advised him to call his PCP, today. Please advise.   Call back ph# [phone].  Called pt back- he's having nerve pain- he notes that oxycodone hasn't helped- which he got from ER 10- days ago- explained that nerve pain can get worse- Will start Keppra/Levicetracem 500 mg 2x/daay x 1 week then 1000 mg 2x/day- explained will take 1-2 weeks to kick in, but usually helps- if has irritability with it, can try Vit B complex to help with side effects. Also get lidocaine patches over the counter to help with nerve pain- put on foot. Will see him 4/8 I agree with severe nerve pain and L peroneal compression neuropathy, would not be able to work doing any standing job, physical job for a minimum of 1 year.

## 2021-03-17 NOTE — Telephone Encounter (Signed)
Based on what just told me, he saw a PM&R physician Dr Bufford Buttner for EMG/NCS yesterday- can we check if he needs to see Dr Allena Katz on 4/4 for the scheduled EMG/NCS??? ML

## 2021-03-21 ENCOUNTER — Encounter: Payer: BC Managed Care – PPO | Admitting: Physical Medicine & Rehabilitation

## 2021-03-22 ENCOUNTER — Ambulatory Visit: Payer: BC Managed Care – PPO | Attending: Physician Assistant

## 2021-03-22 ENCOUNTER — Telehealth: Payer: Self-pay

## 2021-03-22 DIAGNOSIS — R269 Unspecified abnormalities of gait and mobility: Secondary | ICD-10-CM | POA: Insufficient documentation

## 2021-03-22 DIAGNOSIS — M6281 Muscle weakness (generalized): Secondary | ICD-10-CM | POA: Insufficient documentation

## 2021-03-22 DIAGNOSIS — R2681 Unsteadiness on feet: Secondary | ICD-10-CM | POA: Insufficient documentation

## 2021-03-22 NOTE — Telephone Encounter (Signed)
PT called and LVM regarding missed visit and reminded of attendance policy. Reminded patient of next scheduled appointment and clinic phone number should any questions arise.

## 2021-03-25 ENCOUNTER — Other Ambulatory Visit: Payer: Self-pay

## 2021-03-25 ENCOUNTER — Encounter: Payer: Self-pay | Admitting: Physical Medicine and Rehabilitation

## 2021-03-25 ENCOUNTER — Encounter
Payer: BC Managed Care – PPO | Attending: Physical Medicine and Rehabilitation | Admitting: Physical Medicine and Rehabilitation

## 2021-03-25 VITALS — BP 128/94 | HR 116 | Temp 98.7°F | Ht 65.0 in | Wt 210.8 lb

## 2021-03-25 DIAGNOSIS — G90522 Complex regional pain syndrome I of left lower limb: Secondary | ICD-10-CM | POA: Insufficient documentation

## 2021-03-25 DIAGNOSIS — M21372 Foot drop, left foot: Secondary | ICD-10-CM | POA: Diagnosis not present

## 2021-03-25 DIAGNOSIS — M792 Neuralgia and neuritis, unspecified: Secondary | ICD-10-CM

## 2021-03-25 MED ORDER — GABAPENTIN 600 MG PO TABS: 900.0000 mg | ORAL_TABLET | Freq: Three times a day (TID) | ORAL | 5 refills | Status: AC

## 2021-03-25 MED ORDER — PREDNISONE 10 MG PO TABS: 60.0000 mg | ORAL_TABLET | Freq: Every day | ORAL | 0 refills | Status: AC

## 2021-03-25 MED ORDER — LIDOCAINE 5 % EX PTCH: 2.0000 | MEDICATED_PATCH | Freq: Two times a day (BID) | CUTANEOUS | 5 refills | Status: AC

## 2021-03-25 NOTE — Progress Notes (Signed)
Subjective:    Patient ID: Shane Guzman, male    DOB: May 06, 1976, 45 y.o.   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 Here for f/u from hospital.    Not even helping 1% - the Keppra-  Not helping at ALL!  Constantly in pain- - burning, like on fire;  Toes are so swollen and foot so swollen, cannot put his shoe on- had to get a bigger size. The top of brace, as it laces into L foot, hurts so much , cannot tolerate it.   Just increased Keppra to 1000 mg BID yesterday-   Has tried Lidocaine patches over the counter-  3 came in the pack- wore tham all night-  Covered the top of the foot.  Helped a little bit when used it.       Pain Inventory Average Pain 10 Pain Right Now 10 My pain is constant, sharp, burning and tingling  In the last 24 hours, has pain interfered with the following? General activity 10 Relation with others 10 Enjoyment of life 10 What TIME of day is your pain at its worst? night Sleep (in general) Poor  Pain is worse with: standing Pain improves with: heat/ice Relief from Meds: no relief  use a walker how many minutes can you walk? 15 mins ability to climb steps?  yes do you drive?  yes Do you have any goals in this area?  no  employed # of hrs/week on medical leave  numbness tingling trouble walking spasms  Any changes since last visit?  yes [facility] -[facility]  Any changes since last visit?  no    Family History  Problem Relation Age of Onset  . Diverticulitis Maternal Grandmother    Social History   Socioeconomic History  . Marital status: Single    Spouse name: Not on file  . Number of children: Not on file  . Years of education: Not on file  . Highest education level: Not on file  Occupational History  . Not on file  Tobacco Use  . Smoking status: Current Every Day Smoker    Packs/day: 0.00    Years: 18.00    Pack years: 0.00    Types:  Cigarettes  . Smokeless tobacco: Never Used  Substance and Sexual Activity  . Alcohol use: Yes  . Drug use: No  . Sexual activity: Yes  Other Topics Concern  . Not on file  Social History Narrative  . Not on file   Social Determinants of Health   Financial Resource Strain: Not on file  Food Insecurity: Not on file  Transportation Needs: Not on file  Physical Activity: Not on file  Stress: Not on file  Social Connections: Not on file   Past Surgical History:  Procedure Laterality Date  . ACHILLES TENDON REPAIR  06/2010   left; S/P tendon rupture   Past Medical History:  Diagnosis Date  . Diverticulitis of intestine with abscess   . Heart murmur   . Hypertension   . OSA on CPAP   . Rhabdomyolysis    There were no vitals taken for this visit.  Opioid Risk Score:   Fall Risk Score:  `1  Depression screen PHQ 2/9  No flowsheet data found. Review of Systems  Cardiovascular: Positive for leg swelling.       Left foot  Musculoskeletal: Positive for gait problem.  Lower left leg & left foot pain Spasms tingling  Neurological: Positive for numbness.  All other systems reviewed and are negative.      Objective:   Physical Exam Awake, alert, rocking back and forth, trying to control pain, accompanied by fiance, NAD, but in obvious pain.  1-2+ LLE edema to ankle and trace above ankle to knee on L Very sensitive to touch- even trace touching/light touch causes pain.  No redness/no erythema Has 3-/5 DF and 4-/5 PF 0/5 EHL on L       Assessment & Plan:   Pt is a 45 yr old male with with L peroneal compression neuropathy and severe nerve pain. Here for f/u from hospital.    Wondering if developing CRPS?  1. Will try Keppra for another 6 days- if no improvement by then, will wean as below.  2. Wean keppra/levicetracem to 500 mg 2x/day x 3 days,t hen stop.   3. Increase gabapentin 900 mg 3x/day x 1 week, then 1200 mg 3x/day.  Side effects most commonly are  sedation.   4. Will try to get Lidoderm patches 2 patches 1 2hrs on;12 hrs off- for foot pain due to compression neuropathy- it's literally hte only thing that can relieve symptoms fast.    5. Will see if colleague, Dr Wynn Banker- can do a L peroneal block, or do I need to send to Anesthesia to do it?  6.  Low dose naltrexone- will wait for today, but will keep in mind for next week.    7. Prednisone- 60 mg x 3 days, then 40 mg daily x 3 days, then 20 mg daily x 3 days then 10 mg daily x 3 days, then stop.  Start FIRST!  8. F/U in 1 month 9. Pt meets criteria for disability still!    I spent a total of 30 minutes on visit- discussing CRPS, his Sx's as well as how to help improve pain as detailed above.

## 2021-03-25 NOTE — Patient Instructions (Signed)
Pt is a 45 yr old male with with L peroneal compression neuropathy and severe nerve pain. Here for f/u from hospital.    Wondering if developing CRPS?  1. Will try Keppra for another 6 days- if no improvement by then, will wean as below.  2. Wean keppra/levicetracem to 500 mg 2x/day x 3 days,t hen stop.   3. Increase gabapentin 900 mg 3x/day x 1 week, then 1200 mg 3x/day.  Side effects most commonly are sedation.   4. Will try to get Lidoderm patches 2 patches 1 2hrs on;12 hrs off- for foot pain due to compression neuropathy- it's literally hte only thing that can relieve symptoms fast.    5. Will see if colleague, Dr Wynn Banker- can do a L peroneal block, or do I need to send to Anesthesia to do it?  6.  Low dose naltrexone- will wait for today, but will keep in mind for next week.    7. Prednisone- 60 mg x 3 days, then 40 mg daily x 3 days, then 20 mg daily x 3 days then 10 mg daily x 3 days, then stop.  Start FIRST!  8. F/U in 1 month Don't use heat on LLE

## 2021-03-29 ENCOUNTER — Telehealth: Payer: Self-pay | Admitting: Physical Medicine and Rehabilitation

## 2021-03-29 ENCOUNTER — Ambulatory Visit: Payer: BC Managed Care – PPO

## 2021-03-29 NOTE — Telephone Encounter (Signed)
Patient calling to see if  Dr Wynn Banker- can do a L peroneal block, he would like to be scheduled please advise if we may schedule for this procedure

## 2021-03-29 NOTE — Telephone Encounter (Signed)
Can we see if Dr Wynn Banker can do this block? If he cannot, will need to send out for this procedure- can you ask Dr Wynn Banker, or ask him to text me to discuss? Did pt say if his Steroids had made any difference? Please check and let me know- thank you,  ML

## 2021-03-31 ENCOUNTER — Other Ambulatory Visit: Payer: Self-pay

## 2021-03-31 ENCOUNTER — Telehealth: Payer: Self-pay | Admitting: Physical Medicine and Rehabilitation

## 2021-03-31 ENCOUNTER — Ambulatory Visit: Payer: BC Managed Care – PPO | Admitting: Physical Therapy

## 2021-03-31 DIAGNOSIS — R269 Unspecified abnormalities of gait and mobility: Secondary | ICD-10-CM | POA: Diagnosis not present

## 2021-03-31 DIAGNOSIS — G5772 Causalgia of left lower limb: Secondary | ICD-10-CM

## 2021-03-31 DIAGNOSIS — R2681 Unsteadiness on feet: Secondary | ICD-10-CM | POA: Diagnosis not present

## 2021-03-31 DIAGNOSIS — M6281 Muscle weakness (generalized): Secondary | ICD-10-CM

## 2021-03-31 NOTE — Addendum Note (Signed)
Addended by: Genice Rouge on: 03/31/2021 01:04 PM   Modules accepted: Orders

## 2021-03-31 NOTE — Addendum Note (Signed)
Addended by: Genice Rouge on: 03/31/2021 01:00 PM   Modules accepted: Orders

## 2021-03-31 NOTE — Therapy (Signed)
[facility] [address] [place], [place], [address] Phone: [phone]   Fax:  [phone]  Physical Therapy Treatment  Patient Details  Name: Shane Guzman MRN: [id number] Date of Birth: [birth year] Referring Provider (PT): Charlton Amor, PA-C   Encounter Date: 03/31/2021   PT End of Session - 03/31/21 0900    Visit Number 4    Number of Visits 13    PT Start Time 0845    PT Stop Time 0923    PT Time Calculation (min) 38 min    Activity Tolerance Patient tolerated treatment well    Behavior During Therapy Impulsive;WFL for tasks assessed/performed           Past Medical History:  Diagnosis Date  . Diverticulitis of intestine with abscess   . Heart murmur   . Hypertension   . OSA on CPAP   . Rhabdomyolysis     Past Surgical History:  Procedure Laterality Date  . ACHILLES TENDON REPAIR  06/2010   left; S/P tendon rupture    There were no vitals filed for this visit.   Subjective Assessment - 03/31/21 0845    Subjective " I am still having issues in the shoe being too tight and I have to take it off frequently. the exercises are going okay I need to do them more frequently"    Currently in Pain? Yes    Pain Score 7               [facility] PT Assessment - 03/31/21 0001      Assessment   Medical Diagnosis R53.81 (ICD-10-CM) - Other malaise    Referring Provider (PT) Charlton Amor, PA-C                         [facility] Adult PT Treatment/Exercise - 03/31/21 0001      Knee/Hip Exercises: Standing   Gait Training walking 185 ft with RW with clinic AFO in L shoe      Knee/Hip Exercises: Seated   Sit to Sand 2 sets;10 reps   2nd set with RLE advanced forward to promote LLE acivation     Ankle Exercises: Seated   ABC's 2 reps   in elevated postiong   Towel Crunch 3 reps   cues for proper form   Towel Inversion/Eversion --   2 x 10 heavy cues for proper form   BAPS Level 2;Sitting    halted due to increaed difficulty performeing activity   Other Seated Ankle Exercises rocker board 2 x 20 DF/ PR 2 x 20 inversion/ eversion                  PT Education - 03/31/21 0920    Education Details reviewed benefit of in shoe AFO to promote stability    Person(s) Educated Patient    Methods Explanation;Verbal cues;Handout    Comprehension Verbalized understanding;Verbal cues required            PT Short Term Goals - 03/09/21 1537      PT SHORT TERM GOAL #1   Title Pt will be knowledgeable and compliant with HEP in order improve carryover between sessions    Baseline Compliant with some HEP from inpatient rehab    Time 3    Period Weeks    Status New    Target Date 03/30/21      PT SHORT TERM GOAL #2   Title Pt will be  able to ambulate 526ft with least restrictive AD and no increase in L LE pain in order to improve comfort and functional ability    Baseline unable to ambulate without FWW and increased L LE pain at present    Time 3    Period Weeks    Status New    Target Date 03/30/21             PT Long Term Goals - 03/09/21 1539      PT LONG TERM GOAL #1   Title Pt will improve R ankle DF/PF strength to no less than 3+/5 in order to improve mobility and safety    Baseline see flowsheet    Time 6    Period Weeks    Status New    Target Date 04/22/21      PT LONG TERM GOAL #2   Title Pt will be able to ambulate 1570ft with no increase in L LE pain and least restrictive AD in order to improve safety and activity tolerance.    Baseline unable to ambulate without FWW and increased L LE pain at present    Time 6    Period Weeks    Status New    Target Date 04/22/21      PT LONG TERM GOAL #3   Title Pt will improve FOTO score to 58 in order to improve confidence and functional ability to perform ADLs and community navigation.    Baseline 6    Time 6    Period Weeks    Status New    Target Date 04/22/21                 Plan - 03/31/21  0901    Clinical Impression Statement pt repors no changes in pain since the last session reporting limited compliance with his HEP. continued working on on ankle strength/ mobility and gross LE strength which he did well with but exhibits impulsive behavior and was unable to sit without frequently moving around and fidfeting. trialed using in clinic AFO which greatley improved his gait pattern with RW, discussed benefit of getting an in shoe AFO. end of session he reported no changes in pain or stiffness in the ankle.    PT Treatment/Interventions ADLs/Self Care Home Management;Electrical Stimulation;Gait training;Therapeutic activities;Therapeutic exercise;Neuromuscular re-education;Manual techniques;Passive range of motion;Stair training;Moist Heat;Ultrasound;Taping;Joint Manipulations    PT Next Visit Plan continue to progress gait and LE strengthening as tolerated    PT Home Exercise Plan HEP updated - [email or web address]    Consulted and Agree with Plan of Care Patient           Patient will benefit from skilled therapeutic intervention in order to improve the following deficits and impairments:  Abnormal gait,Decreased activity tolerance,Decreased balance,Decreased mobility,Decreased strength,Decreased range of motion,Decreased endurance,Pain  Visit Diagnosis: Muscle weakness (generalized)  Impaired gait  Unsteadiness on feet     Problem List Patient Active Problem List   Diagnosis Date Noted  . Neuropathic pain   . Drug induced constipation   . Left foot drop   . AKI (acute kidney injury) (HCC)   . Debility 02/11/2021  . Acute renal failure (ARF) (HCC) 02/04/2021  . Diverticulitis 06/13/2015  . Sigmoid diverticulitis 12/06/2011  . Diverticulitis of colon with perforation 11/22/2011  . TOBACCO USER 09/04/2009  . DIVERTICULAR DISEASE 11/18/2007  . MURMUR 11/18/2007   Lulu Riding PT, DPT, LAT, ATC  03/31/21  9:22 AM      [facility] [address] [place], [place], [address] Phone: [phone]   Fax:  [phone]  Name: Shane Guzman MRN: [id number] Date of Birth: 1976/10/27

## 2021-03-31 NOTE — Telephone Encounter (Signed)
Went to [employer] this morning they let him use a brace in shoe that straps to his shoe and it felt a whole lot better it is called a AFO and he would like one to be prescribed. Marland Kitchen

## 2021-03-31 NOTE — Telephone Encounter (Signed)
  Things are a little better.  Did take Prednisone- appears like it helped, but swelling and weakness still a problem.   Still throbbing.   Still taking Levicetracem.   Wants to focus on the meds he's on right now- not add Low dose Naltrexone.   Will order L AFO- ask Dr clinic to write Rx for me since I'm not in clinic until 1.5 weeks from now- don't want pt to wait. Trying to make this occur.   Also,

## 2021-04-05 ENCOUNTER — Other Ambulatory Visit: Payer: Self-pay

## 2021-04-05 ENCOUNTER — Ambulatory Visit: Payer: BC Managed Care – PPO

## 2021-04-05 DIAGNOSIS — M6281 Muscle weakness (generalized): Secondary | ICD-10-CM | POA: Diagnosis not present

## 2021-04-05 DIAGNOSIS — R2681 Unsteadiness on feet: Secondary | ICD-10-CM | POA: Diagnosis not present

## 2021-04-05 DIAGNOSIS — R269 Unspecified abnormalities of gait and mobility: Secondary | ICD-10-CM

## 2021-04-05 NOTE — Therapy (Signed)
[facility] [address] [place], [place], [address] Phone: [phone]   Fax:  [phone]  Physical Therapy Treatment  Patient Details  Name: Shane Guzman MRN: [id number] Date of Birth: [birth year] Referring Provider (PT): Charlton Amor, PA-C   Encounter Date: 04/05/2021   PT End of Session - 04/05/21 0908    Visit Number 5    Number of Visits 13    Date for PT Re-Evaluation 04/22/21    Authorization Type BCBS    PT Start Time 0912    PT Stop Time 0951    PT Time Calculation (min) 39 min    Activity Tolerance Patient tolerated treatment well;No increased pain    Behavior During Therapy Impulsive;WFL for tasks assessed/performed           Past Medical History:  Diagnosis Date  . Diverticulitis of intestine with abscess   . Heart murmur   . Hypertension   . OSA on CPAP   . Rhabdomyolysis     Past Surgical History:  Procedure Laterality Date  . ACHILLES TENDON REPAIR  06/2010   left; S/P tendon rupture    There were no vitals filed for this visit.   Subjective Assessment - 04/05/21 0909    Subjective Pt states he is having a little less pain today, mainly just a very prominant "tingling" sensation. He has been compliant with his HEP over the last week with no adverse effect. Pt is ready to begin PT treatment at this time.    Currently in Pain? No/denies    Pain Score 0-No pain                             [facility] Adult PT Treatment/Exercise - 04/05/21 0001      Ambulation/Gait   Ambulation/Gait Assistance 5: Supervision    Ambulation Distance (Feet) 370 Feet    Assistive device Rolling walker;Straight cane    Gait Pattern Left circumduction;Decreased dorsiflexion - left    Ambulation Surface Level    Gait Comments patient ambulated much better with clinic AFO; 184ft with FWW then 124ft w/ SPC; pt also continues to demo slightly implusive behavior and is still at an increased risk for  falls      Knee/Hip Exercises: Standing   Heel Raises Limitations heel-toe raises x 20    Other Standing Knee Exercises lateral walk at counter x 5 - red tband      Knee/Hip Exercises: Seated   Sit to Sand 2 sets;10 reps   with RLE advanced forward to promote LLE acivation     Ankle Exercises: Seated   BAPS Level 2;Sitting   cw/ccw x 15 ea     Ankle Exercises: Supine   T-Band ankle 4-way green tband 2x10      Ankle Exercises: Standing   Other Standing Ankle Exercises tandem stance 2x30 sec L foot back   even surface   Other Standing Ankle Exercises tandem stance 2x30 sec L foot back on foam   SLS too difficult                 PT Education - 04/05/21 1010    Education Details new HEP    Person(s) Educated Patient    Methods Explanation;Demonstration;Handout    Comprehension Verbalized understanding;Returned demonstration            PT Short Term Goals - 03/09/21 1537      PT SHORT TERM GOAL #  1   Title Pt will be knowledgeable and compliant with HEP in order improve carryover between sessions    Baseline Compliant with some HEP from inpatient rehab    Time 3    Period Weeks    Status New    Target Date 03/30/21      PT SHORT TERM GOAL #2   Title Pt will be able to ambulate 555ft with least restrictive AD and no increase in L LE pain in order to improve comfort and functional ability    Baseline unable to ambulate without FWW and increased L LE pain at present    Time 3    Period Weeks    Status New    Target Date 03/30/21             PT Long Term Goals - 03/09/21 1539      PT LONG TERM GOAL #1   Title Pt will improve R ankle DF/PF strength to no less than 3+/5 in order to improve mobility and safety    Baseline see flowsheet    Time 6    Period Weeks    Status New    Target Date 04/22/21      PT LONG TERM GOAL #2   Title Pt will be able to ambulate 1563ft with no increase in L LE pain and least restrictive AD in order to improve safety and  activity tolerance.    Baseline unable to ambulate without FWW and increased L LE pain at present    Time 6    Period Weeks    Status New    Target Date 04/22/21      PT LONG TERM GOAL #3   Title Pt will improve FOTO score to 58 in order to improve confidence and functional ability to perform ADLs and community navigation.    Baseline 6    Time 6    Period Weeks    Status New    Target Date 04/22/21                 Plan - 04/05/21 1008    Clinical Impression Statement Pt was able to complete prescribed exercises with no adverse effect or increase in pain. He continues to show improved ambulation with clinic AFO and has orders from PCP to recieve on soon. Progression to [facility] with clinic AFO donned showed improving functional mobility. He continues to be an an increased risk for falls without AFO donned. Pt was also able to progress to standing strengthening and stability exercises, showing improving motor control of distal L LE. He continues to benefit from skilled PT services and should continue to be seen per POC as prescribed.    PT Treatment/Interventions ADLs/Self Care Home Management;Electrical Stimulation;Gait training;Therapeutic activities;Therapeutic exercise;Neuromuscular re-education;Manual techniques;Passive range of motion;Stair training;Moist Heat;Ultrasound;Taping;Joint Manipulations    PT Next Visit Plan continue to progress gait and LE strengthening as tolerated    PT Home Exercise Plan HEP updated - [email or web address]           Patient will benefit from skilled therapeutic intervention in order to improve the following deficits and impairments:  Abnormal gait,Decreased activity tolerance,Decreased balance,Decreased mobility,Decreased strength,Decreased range of motion,Decreased endurance,Pain  Visit Diagnosis: Muscle weakness (generalized)  Impaired gait  Unsteadiness on feet     Problem List Patient Active Problem List   Diagnosis Date Noted  . Neuropathic  pain   . Drug induced constipation   . Left foot drop   . AKI (acute kidney injury) (  HCC)   . Debility 02/11/2021  . Acute renal failure (ARF) (HCC) 02/04/2021  . Diverticulitis 06/13/2015  . Sigmoid diverticulitis 12/06/2011  . Diverticulitis of colon with perforation 11/22/2011  . TOBACCO USER 09/04/2009  . DIVERTICULAR DISEASE 11/18/2007  . MURMUR 11/18/2007    Eloy End, PT, DPT 04/05/21 10:12 AM  [facility] [address] [place], [place], [address] Phone: [phone]   Fax:  [phone]  Name: Shane Guzman MRN: [id number] Date of Birth: 1976/08/18

## 2021-04-07 ENCOUNTER — Other Ambulatory Visit: Payer: Self-pay

## 2021-04-07 ENCOUNTER — Ambulatory Visit: Payer: BC Managed Care – PPO

## 2021-04-07 DIAGNOSIS — R269 Unspecified abnormalities of gait and mobility: Secondary | ICD-10-CM

## 2021-04-07 DIAGNOSIS — R2681 Unsteadiness on feet: Secondary | ICD-10-CM | POA: Diagnosis not present

## 2021-04-07 DIAGNOSIS — M6281 Muscle weakness (generalized): Secondary | ICD-10-CM | POA: Diagnosis not present

## 2021-04-07 NOTE — Therapy (Signed)
[facility] [place] [facility] [address] [place], [place], [address] Phone: [phone]   Fax:  [phone]  Physical Therapy Treatment  Patient Details  Name: Shane Guzman MRN: [id number] Date of Birth: 08-15-1976 Referring Provider (PT): Charlton Amor, PA-C   Encounter Date: 04/07/2021   PT End of Session - 04/07/21 0907    Visit Number 6    Number of Visits 13    Date for PT Re-Evaluation 04/22/21    Authorization Type BCBS - FOTO 6th and 10th visit    PT Start Time 0909    PT Stop Time 0947    PT Time Calculation (min) 38 min    Activity Tolerance Patient tolerated treatment well;No increased pain    Behavior During Therapy Impulsive;WFL for tasks assessed/performed           Past Medical History:  Diagnosis Date  . Diverticulitis of intestine with abscess   . Heart murmur   . Hypertension   . OSA on CPAP   . Rhabdomyolysis     Past Surgical History:  Procedure Laterality Date  . ACHILLES TENDON REPAIR  06/2010   left; S/P tendon rupture    There were no vitals filed for this visit.   Subjective Assessment - 04/07/21 0908    Subjective Pt presents to PT with reports of increased L foot pain today. He has been compliant with his HEP per reports. Pt is ready to begin PT treatment at this time.    Currently in Pain? Yes    Pain Score 8     Pain Location Foot    Pain Orientation Left              [facility] PT Assessment - 04/07/21 0001      Observation/Other Assessments   Focus on Therapeutic Outcomes (FOTO)  46%                         [facility] Adult PT Treatment/Exercise - 04/07/21 0001      Knee/Hip Exercises: Standing   Other Standing Knee Exercises lateral walk at counter x 5 - red tband    Other Standing Knee Exercises monster walk x 3 laps red tband      Ankle Exercises: Seated   Heel Raises 20 reps;Left   x 2   Other Seated Ankle Exercises ankle 4-way 2x10 red tband    Other Seated Ankle  Exercises rocker board 2 x 20 DF/ PR 2 x 20 inversion/ eversion L LE      Ambulation   Ambulation/Gait Assistance Details Other (comment)   ambulation 187ft with FWW and rigid clinic AFO to L LE                 PT Education - 04/07/21 0953    Education Details strategies to reduce L LE swelling    Person(s) Educated Patient    Methods Explanation    Comprehension Verbalized understanding            PT Short Term Goals - 04/07/21 0954      PT SHORT TERM GOAL #1   Title Pt will be knowledgeable and compliant with HEP in order improve carryover between sessions    Baseline Compliant with some HEP from inpatient rehab    Time 3    Period Weeks    Status Achieved    Target Date 03/30/21      PT SHORT TERM GOAL #2   Title  Pt will be able to ambulate 530ft with least restrictive AD and no increase in L LE pain in order to improve comfort and functional ability    Baseline unable to ambulate without FWW and increased L LE pain at present    Time 3    Period Weeks    Status Achieved    Target Date 03/30/21             PT Long Term Goals - 04/07/21 0954      PT LONG TERM GOAL #1   Title Pt will improve R ankle DF/PF strength to no less than 3+/5 in order to improve mobility and safety    Baseline see flowsheet    Time 6    Period Weeks    Status On-going      PT LONG TERM GOAL #2   Title Pt will be able to ambulate 152ft with no increase in L LE pain and least restrictive AD in order to improve safety and activity tolerance.    Baseline unable to ambulate without FWW and increased L LE pain at present    Time 6    Period Weeks    Status On-going      PT LONG TERM GOAL #3   Title Pt will improve FOTO score to 58 in order to improve confidence and functional ability to perform ADLs and community navigation.    Baseline 36% - updated(04/07/21) - 46%    Time 6    Period Weeks    Status On-going                 Plan - 04/07/21 0949    Clinical  Impression Statement Pt tolerated treatment fair and was able to complete prescribed exercises with no adverse effect. Pt was once again fidgeting very frequently and showed impulsive behavior during gait. He does continue to show improved ambulation with ridgid clinic AFO. Pt continues to benefit from skilled PT services and should continue to be seen per POC as prescribed.    PT Treatment/Interventions ADLs/Self Care Home Management;Electrical Stimulation;Gait training;Therapeutic activities;Therapeutic exercise;Neuromuscular re-education;Manual techniques;Passive range of motion;Stair training;Moist Heat;Ultrasound;Taping;Joint Manipulations    PT Next Visit Plan continue to progress gait and LE strengthening as tolerated    PT Home Exercise Plan HEP updated - [id number]           Patient will benefit from skilled therapeutic intervention in order to improve the following deficits and impairments:  Abnormal gait,Decreased activity tolerance,Decreased balance,Decreased mobility,Decreased strength,Decreased range of motion,Decreased endurance,Pain  Visit Diagnosis: Muscle weakness (generalized)  Impaired gait  Unsteadiness on feet     Problem List Patient Active Problem List   Diagnosis Date Noted  . Neuropathic pain   . Drug induced constipation   . Left foot drop   . AKI (acute kidney injury) (HCC)   . Debility 02/11/2021  . Acute renal failure (ARF) (HCC) 02/04/2021  . Diverticulitis 06/13/2015  . Sigmoid diverticulitis 12/06/2011  . Diverticulitis of colon with perforation 11/22/2011  . TOBACCO USER 09/04/2009  . DIVERTICULAR DISEASE 11/18/2007  . MURMUR 11/18/2007    Eloy End, PT, DPT 04/07/21 9:56 AM  [facility] [address] [place], [place], [address] Phone: [phone]   Fax:  [phone]  Name: ACEL NATZKE MRN: [id number] Date of Birth: 1976-04-25

## 2021-04-12 ENCOUNTER — Ambulatory Visit: Payer: BC Managed Care – PPO

## 2021-04-12 ENCOUNTER — Telehealth: Payer: Self-pay

## 2021-04-12 ENCOUNTER — Other Ambulatory Visit: Payer: Self-pay

## 2021-04-12 DIAGNOSIS — R269 Unspecified abnormalities of gait and mobility: Secondary | ICD-10-CM | POA: Diagnosis not present

## 2021-04-12 DIAGNOSIS — R2681 Unsteadiness on feet: Secondary | ICD-10-CM

## 2021-04-12 DIAGNOSIS — M6281 Muscle weakness (generalized): Secondary | ICD-10-CM | POA: Diagnosis not present

## 2021-04-12 DIAGNOSIS — S8412XS Injury of peroneal nerve at lower leg level, left leg, sequela: Secondary | ICD-10-CM

## 2021-04-12 NOTE — Therapy (Signed)
[facility] Out[facility] Rehabilitation [facility] [address] [place], [place], [address] Phone: [phone]   Fax:  [phone]  Physical Therapy Treatment  Patient Details  Name: Shane Guzman MRN: [id number] Date of Birth: [birth year] Referring Provider (PT): Charlton Amor, PA-C   Encounter Date: 04/12/2021   PT End of Session - 04/12/21 0911    Visit Number 7    Number of Visits 13    Date for PT Re-Evaluation 04/22/21    Authorization Type BCBS - FOTO 6th and 10th visit    PT Start Time 0915    PT Stop Time 0953    PT Time Calculation (min) 38 min    Activity Tolerance Patient tolerated treatment well;No increased pain    Behavior During Therapy Impulsive;WFL for tasks assessed/performed           Past Medical History:  Diagnosis Date  . Diverticulitis of intestine with abscess   . Heart murmur   . Hypertension   . OSA on CPAP   . Rhabdomyolysis     Past Surgical History:  Procedure Laterality Date  . ACHILLES TENDON REPAIR  06/2010   left; S/P tendon rupture    There were no vitals filed for this visit.   Subjective Assessment - 04/12/21 0911    Subjective Pt presents to PT with reports of continued L foot pain and discomfort. He has been compliant with his HEP with no adverse effects per report. Pt is ready to begin PT treatment at this time.    Currently in Pain? Yes    Pain Score 6     Pain Location Foot    Pain Orientation Left                             [facility] Adult PT Treatment/Exercise - 04/12/21 0001      Knee/Hip Exercises: Aerobic   Nustep lvl 6 LE only x 5 min wihle taking subjective      Knee/Hip Exercises: Standing   Hip Abduction 2 sets;15 reps    Abduction Limitations red tband    Hip Extension 2 sets;10 reps    Extension Limitations red tband    Functional Squat 2 sets;10 reps    SLS 2x30 sec    Other Standing Knee Exercises also tandem 2x30 sec L LE back      Knee/Hip Exercises: Seated    Sit to Sand 2 sets;10 reps;without UE support   R foot forward to increase difficulty on L     Ankle Exercises: Supine   T-Band ankle 4-way green tband 2x10      Ambulation   Ambulation/Gait Assistance Details Other (comment);Verbal cues for safe use of DME/AE;Verbal cues for technique;Verbal cues for sequencing   amb 135ft with SPC in R hand and clinic AFO donned on L foot; pt then ambulated 180ft with no AD and clinic AFO donned                   PT Short Term Goals - 04/07/21 0954      PT SHORT TERM GOAL #1   Title Pt will be knowledgeable and compliant with HEP in order improve carryover between sessions    Baseline Compliant with some HEP from inpatient rehab    Time 3    Period Weeks    Status Achieved    Target Date 03/30/21      PT SHORT TERM GOAL #2   Title  Pt will be able to ambulate 581ft with least restrictive AD and no increase in L LE pain in order to improve comfort and functional ability    Baseline unable to ambulate without FWW and increased L LE pain at present    Time 3    Period Weeks    Status Achieved    Target Date 03/30/21             PT Long Term Goals - 04/07/21 0954      PT LONG TERM GOAL #1   Title Pt will improve R ankle DF/PF strength to no less than 3+/5 in order to improve mobility and safety    Baseline see flowsheet    Time 6    Period Weeks    Status On-going      PT LONG TERM GOAL #2   Title Pt will be able to ambulate 1558ft with no increase in L LE pain and least restrictive AD in order to improve safety and activity tolerance.    Baseline unable to ambulate without FWW and increased L LE pain at present    Time 6    Period Weeks    Status On-going      PT LONG TERM GOAL #3   Title Pt will improve FOTO score to 58 in order to improve confidence and functional ability to perform ADLs and community navigation.    Baseline 36% - updated(04/07/21) - 46%    Time 6    Period Weeks    Status On-going                  Plan - 04/12/21 0936    Clinical Impression Statement Pt was once again able to complete all prescribed exercises, but continued to note discomfort in L foot/ankle. He was able to progress standing activity today, showing improving L ankle stability. He ambulates better with AFO donned with and without AD. Pt is progressing well overall despite continued L foot pain. PT will continue to progress as able per POC as prescribed.    PT Treatment/Interventions ADLs/Self Care Home Management;Electrical Stimulation;Gait training;Therapeutic activities;Therapeutic exercise;Neuromuscular re-education;Manual techniques;Passive range of motion;Stair training;Moist Heat;Ultrasound;Taping;Joint Manipulations    PT Next Visit Plan continue to progress gait and LE strengthening as tolerated    PT Home Exercise Plan HEP updated - [id number]           Patient will benefit from skilled therapeutic intervention in order to improve the following deficits and impairments:  Abnormal gait,Decreased activity tolerance,Decreased balance,Decreased mobility,Decreased strength,Decreased range of motion,Decreased endurance,Pain  Visit Diagnosis: Muscle weakness (generalized)  Impaired gait  Unsteadiness on feet     Problem List Patient Active Problem List   Diagnosis Date Noted  . Neuropathic pain   . Drug induced constipation   . Left foot drop   . AKI (acute kidney injury) (HCC)   . Debility 02/11/2021  . Acute renal failure (ARF) (HCC) 02/04/2021  . Diverticulitis 06/13/2015  . Sigmoid diverticulitis 12/06/2011  . Diverticulitis of colon with perforation 11/22/2011  . TOBACCO USER 09/04/2009  . DIVERTICULAR DISEASE 11/18/2007  . MURMUR 11/18/2007    Eloy End, PT, DPT 04/12/21 9:56 AM  [facility] [address] [place], [place], [address] Phone: [phone]   Fax:  [phone]  Name: Shane Guzman MRN: [id number] Date of  Birth: [birth year]

## 2021-04-12 NOTE — Telephone Encounter (Signed)
Notified and placard on Dr Dahlia Client computer to sign.

## 2021-04-12 NOTE — Telephone Encounter (Signed)
will fill out handicapped placard in AM- let pt know-  Have also called multiple people - waiting to hear about nerve block.   [place] send to Dr Smitty [place]ords [place]- have spoken to pt- he agreed.   Sent referral externally to Dr Roderic Ovens and gave office fax number to send demographics and last note.

## 2021-04-12 NOTE — Telephone Encounter (Signed)
Pt Gay Rape will be coming into office tomorrow to get a handicap form, how ever he also wants to know if you referred him to get the nerve block because he hasn't got any calls .

## 2021-04-14 ENCOUNTER — Telehealth: Payer: Self-pay

## 2021-04-14 ENCOUNTER — Ambulatory Visit: Payer: BC Managed Care – PPO

## 2021-04-14 ENCOUNTER — Other Ambulatory Visit: Payer: Self-pay

## 2021-04-14 DIAGNOSIS — M6281 Muscle weakness (generalized): Secondary | ICD-10-CM

## 2021-04-14 DIAGNOSIS — R269 Unspecified abnormalities of gait and mobility: Secondary | ICD-10-CM | POA: Diagnosis not present

## 2021-04-14 DIAGNOSIS — R2681 Unsteadiness on feet: Secondary | ICD-10-CM

## 2021-04-14 NOTE — Therapy (Signed)
[facility] Outpatient Rehabilitation [facility] [address] [place], [place], [address] Phone: [phone]   Fax:  [phone]  Physical Therapy Treatment  Patient Details  Name: Shane Guzman MRN: [id number] Date of Birth: [birth year] Referring Provider (PT): Charlton Amor, PA-C   Encounter Date: 04/14/2021   PT End of Session - 04/14/21 0911    Visit Number 8    Number of Visits 13    Date for PT Re-Evaluation 04/22/21    Authorization Type BCBS - FOTO 6th and 10th visit    PT Start Time 0914    PT Stop Time 0953    PT Time Calculation (min) 39 min    Activity Tolerance Patient tolerated treatment well;No increased pain    Behavior During Therapy Impulsive;WFL for tasks assessed/performed           Past Medical History:  Diagnosis Date  . Diverticulitis of intestine with abscess   . Heart murmur   . Hypertension   . OSA on CPAP   . Rhabdomyolysis     Past Surgical History:  Procedure Laterality Date  . ACHILLES TENDON REPAIR  06/2010   left; S/P tendon rupture    There were no vitals filed for this visit.   Subjective Assessment - 04/14/21 0911    Subjective Pt presents to PT with reports of increased L foot pain and discomfort. He states he felt good after last session but started having increased pain last night. He has been compliant with HEP with no adverse effect. Pt is ready to begin PT at this time.    Currently in Pain? Yes    Pain Score 10-Worst pain ever    Pain Location Foot    Pain Orientation Left    Pain Descriptors / Indicators Burning                             [facility] Adult PT Treatment/Exercise - 04/14/21 0001      Knee/Hip Exercises: Aerobic   Nustep lvl 6 LE only x 4 min wihle taking subjective      Knee/Hip Exercises: Standing   Heel Raises Limitations heel-toe raises x 15    Hip Abduction 3 sets;10 reps;Both    Abduction Limitations red tband    Hip Extension 2 sets;10 reps;Both     Extension Limitations red tband    Functional Squat 3 sets;10 reps    SLS 2x30 sec    Other Standing Knee Exercises 2x30 sec L LE back    Other Standing Knee Exercises lateral walk and moster walk red tband x 3 laps ea      Knee/Hip Exercises: Seated   Sit to Sand 3 sets;10 reps;without UE support;Other (comment)   R foot forward to increase difficulty on L     Ankle Exercises: Seated   Other Seated Ankle Exercises rocker board 2 x 20 DF/ PR 2 x 20 inversion/ eversion L LE      Ambulation   Ambulation/Gait Assistance Details --   amb 467ft with SPC in R hand and clinic AFO donned on L foot                   PT Short Term Goals - 04/07/21 0954      PT SHORT TERM GOAL #1   Title Pt will be knowledgeable and compliant with HEP in order improve carryover between sessions    Baseline Compliant with some HEP from inpatient  rehab    Time 3    Period Weeks    Status Achieved    Target Date 03/30/21      PT SHORT TERM GOAL #2   Title Pt will be able to ambulate 588ft with least restrictive AD and no increase in L LE pain in order to improve comfort and functional ability    Baseline unable to ambulate without FWW and increased L LE pain at present    Time 3    Period Weeks    Status Achieved    Target Date 03/30/21             PT Long Term Goals - 04/07/21 0954      PT LONG TERM GOAL #1   Title Pt will improve R ankle DF/PF strength to no less than 3+/5 in order to improve mobility and safety    Baseline see flowsheet    Time 6    Period Weeks    Status On-going      PT LONG TERM GOAL #2   Title Pt will be able to ambulate 1565ft with no increase in L LE pain and least restrictive AD in order to improve safety and activity tolerance.    Baseline unable to ambulate without FWW and increased L LE pain at present    Time 6    Period Weeks    Status On-going      PT LONG TERM GOAL #3   Title Pt will improve FOTO score to 58 in order to improve confidence and  functional ability to perform ADLs and community navigation.    Baseline 36% - updated(04/07/21) - 46%    Time 6    Period Weeks    Status On-going                 Plan - 04/14/21 0941    Clinical Impression Statement Pt tolerated treatment fair but continued to have severe L foot pain and paresthesias. He is continuing to improve his distal L LE motor control and stability while progressing ambulation distance at each session. He is recieving his rigid AFO next week and will bring it in to practice ambulation. PT will continue to progress per POC as able.    PT Treatment/Interventions ADLs/Self Care Home Management;Electrical Stimulation;Gait training;Therapeutic activities;Therapeutic exercise;Neuromuscular re-education;Manual techniques;Passive range of motion;Stair training;Moist Heat;Ultrasound;Taping;Joint Manipulations    PT Next Visit Plan continue to progress gait and LE strengthening as tolerated    PT Home Exercise Plan HEP updated - [id number]           Patient will benefit from skilled therapeutic intervention in order to improve the following deficits and impairments:  Abnormal gait,Decreased activity tolerance,Decreased balance,Decreased mobility,Decreased strength,Decreased range of motion,Decreased endurance,Pain  Visit Diagnosis: Muscle weakness (generalized)  Impaired gait  Unsteadiness on feet     Problem List Patient Active Problem List   Diagnosis Date Noted  . Neuropathic pain   . Drug induced constipation   . Left foot drop   . AKI (acute kidney injury) (HCC)   . Debility 02/11/2021  . Acute renal failure (ARF) (HCC) 02/04/2021  . Diverticulitis 06/13/2015  . Sigmoid diverticulitis 12/06/2011  . Diverticulitis of colon with perforation 11/22/2011  . TOBACCO USER 09/04/2009  . DIVERTICULAR DISEASE 11/18/2007  . MURMUR 11/18/2007    Eloy End, PT, DPT 04/14/21 9:55 AM  [facility] [address] [place], [place], [address] Phone: [phone]   Fax:  [phone]  Name: Shane Guzman MRN: [id number] Date of Birth: 12-30-1975

## 2021-04-14 NOTE — Telephone Encounter (Signed)
Pt Shane Guzman called this morning and said he spoke to you and you were going to start him on medication and he said he hasnt heard back from the pharmacy.

## 2021-04-15 NOTE — Telephone Encounter (Signed)
Refaxed paperwork to Dr Smitty Cords office.  For pt- since hasn't gotten an appointment.  Dr Smitty Cords office said never got an paperwork- we DID fax it- documented in chart. Will fax again. Also L/M on Dr Smitty Cords office to call me if they don't get the fax.   Also Called in Rx for Low dose Naltrexone- 4 mg daily x 1 week, then 8 mg daily- for 2 week supply.  Then also added 8 mg daily x 3 months- they will call pt when order ready.

## 2021-04-19 ENCOUNTER — Telehealth: Payer: Self-pay | Admitting: Internal Medicine

## 2021-04-19 ENCOUNTER — Ambulatory Visit: Payer: BC Managed Care – PPO

## 2021-04-19 ENCOUNTER — Telehealth: Payer: Self-pay | Admitting: *Deleted

## 2021-04-19 ENCOUNTER — Encounter: Payer: Self-pay | Admitting: *Deleted

## 2021-04-19 NOTE — Telephone Encounter (Signed)
Name: Shane Guzman  MRN: [id number] DOB: [birth year]  Patient has been enrolled into the Transportation program.   Shane Guzman DOB: 11-01-1976 MRN: [id number]   RIDER WAIVER AND RELEASE OF LIABILITY  For purposes of improving physical access to our facilities, [place] is pleased to partner with third parties to provide [facility] patients or other authorized individuals the option of convenient, on-demand ground transportation services (the [employer]") through use of the technology service that enables users to request on-demand ground transportation from independent third-party providers.  By opting to use and accept these [employer], I, the undersigned, hereby agree on behalf of myself, and on behalf of any minor child using the [employer] for whom I am the parent or legal guardian, as follows:  1. Science writer provided to me are provided by independent third-party transportation providers who are not [employer] or employees and who are unaffiliated with [employer]. 2. [place] is neither a transportation carrier nor a common or public carrier. 3. [place] has no control over the quality or safety of the transportation that occurs as a result of the [employer]. 4. [place] [facility] cannot guarantee that any third-party transportation provider will complete any arranged transportation service. 5. [employer] makes no representation, warranty, or guarantee regarding the reliability, timeliness, quality, safety, suitability, or availability of any of the Transport Services or that they will be error free. 6. I fully understand that traveling by vehicle involves risks and dangers of serious bodily injury, including permanent disability, paralysis, and death. I agree, on behalf of myself and on behalf of any minor child using the Transport Services for whom I am the parent or legal guardian, that the entire risk arising out of my use of the  [employer] remains solely with me, to the maximum extent permitted under applicable law. 7. The [employer] are provided "as is" and "as available." [facility] disclaims all representations and warranties, express, implied or statutory, not expressly set out in these terms, including the implied warranties of merchantability and fitness for a particular purpose. 8. I hereby waive and release [place], its agents, employees, officers, directors, representatives, insurers, attorneys, assigns, successors, subsidiaries, and affiliates from any and all past, present, or future claims, demands, liabilities, actions, causes of action, or suits of any kind directly or indirectly arising from acceptance and use of the [employer]. 9. I further waive and release [place] and its affiliates from all present and future liability and responsibility for any injury or death to persons or damages to property caused by or related to the use of the [employer]. 10. I have read this Waiver and Release of Liability, and I understand the terms used in it and their legal significance. This Waiver is freely and voluntarily given with the understanding that my right (as well as the right of any minor child for whom I am the parent or legal guardian using the [employer]) to legal recourse against [place] in connection with the [employer] is knowingly surrendered in return for use of these services.   I attest that I read the consent document to Shane Guzman, gave Shane Guzman the opportunity to ask questions and answered the questions asked (if any). I affirm that Shane Guzman then provided consent for he's participation in this program.     Shane Guzman

## 2021-04-19 NOTE — Progress Notes (Unsigned)
   Subjective:    Patient ID: Shane Guzman, male    DOB: 10-Jun-1976, 45 y.o.   MRN: [id number]  HPI  Pain Inventory Average Pain 10 Pain Right Now 10 My pain is constant, sharp, burning and tingling  In the last 24 hours, has pain interfered with the following? General activity 10 Relation with others 10 Enjoyment of life 10 What TIME of day is your pain at its worst? night Sleep (in general) Poor  Pain is worse with: standing Pain improves with: heat/ice Relief from Meds: no relief  Family History  Problem Relation Age of Onset  . Diverticulitis Maternal Grandmother    Social History   Socioeconomic History  . Marital status: Single    Spouse name: Not on file  . Number of children: Not on file  . Years of education: Not on file  . Highest education level: Not on file  Occupational History  . Not on file  Tobacco Use  . Smoking status: Current Every Day Smoker    Packs/day: 0.00    Years: 18.00    Pack years: 0.00    Types: Cigarettes  . Smokeless tobacco: Never Used  Vaping Use  . Vaping Use: Never used  Substance and Sexual Activity  . Alcohol use: Yes  . Drug use: No  . Sexual activity: Yes  Other Topics Concern  . Not on file  Social History Narrative  . Not on file   Social Determinants of Health   Financial Resource Strain: Not on file  Food Insecurity: Not on file  Transportation Needs: Not on file  Physical Activity: Not on file  Stress: Not on file  Social Connections: Not on file   Past Surgical History:  Procedure Laterality Date  . ACHILLES TENDON REPAIR  06/2010   left; S/P tendon rupture   Past Surgical History:  Procedure Laterality Date  . ACHILLES TENDON REPAIR  06/2010   left; S/P tendon rupture   Past Medical History:  Diagnosis Date  . Diverticulitis of intestine with abscess   . Heart murmur   . Hypertension   . OSA on CPAP   . Rhabdomyolysis    There were no vitals taken for this visit.  Opioid Risk Score:    Fall Risk Score:  `1  Depression screen PHQ 2/9  Depression screen [facility] 2/9 04/19/2021 03/25/2021  Decreased Interest 3 3  Down, Depressed, Hopeless 1 1  PHQ - 2 Score 4 4  Altered sleeping - 1  Tired, decreased energy - 1  Change in appetite - 1  Feeling bad or failure about yourself  - 2  Trouble concentrating - 0  Moving slowly or fidgety/restless - 1  Suicidal thoughts - 0  PHQ-9 Score - 10    Review of Systems  Constitutional: Negative.   HENT: Negative.   Eyes: Negative.   Respiratory: Negative.   Cardiovascular: Negative.   Gastrointestinal: Negative.   Endocrine: Negative.   Genitourinary: Negative.   Musculoskeletal: Positive for gait problem.       Spasms  Skin: Negative.   Allergic/Immunologic: Negative.   Neurological:       Tingling  Hematological: Negative.   Psychiatric/Behavioral: Positive for dysphoric mood.  All other systems reviewed and are negative.      Objective:   Physical Exam        Assessment & Plan:

## 2021-04-19 NOTE — Telephone Encounter (Signed)
Opened in error

## 2021-04-19 NOTE — Telephone Encounter (Signed)
Cordelia Pen called to let us know Shane Guzman has an appt scheduled for Dr Roderic Ovens @ [facility] on 05/03/21 @ 10 am.

## 2021-04-20 ENCOUNTER — Encounter: Payer: BC Managed Care – PPO | Admitting: Physical Medicine and Rehabilitation

## 2021-04-20 NOTE — Telephone Encounter (Signed)
He has appt scheduled for 05/18/21 with you.

## 2021-04-20 NOTE — Telephone Encounter (Signed)
OK- well pt also cancelled today's appointment as well- can we make sure he has a f/u appointment? Thanks, ML

## 2021-04-21 ENCOUNTER — Other Ambulatory Visit: Payer: Self-pay

## 2021-04-21 ENCOUNTER — Ambulatory Visit: Payer: BC Managed Care – PPO | Attending: Physician Assistant

## 2021-04-21 DIAGNOSIS — R269 Unspecified abnormalities of gait and mobility: Secondary | ICD-10-CM | POA: Diagnosis not present

## 2021-04-21 DIAGNOSIS — M6281 Muscle weakness (generalized): Secondary | ICD-10-CM | POA: Diagnosis not present

## 2021-04-21 DIAGNOSIS — R2681 Unsteadiness on feet: Secondary | ICD-10-CM | POA: Insufficient documentation
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
## 2021-04-22 ENCOUNTER — Telehealth: Payer: Self-pay

## 2021-04-22 NOTE — Telephone Encounter (Signed)
Patient called he needs to r/s his appointment call [phone]

## 2021-04-25 ENCOUNTER — Emergency Department (HOSPITAL_COMMUNITY)
Admission: EM | Admit: 2021-04-25 | Discharge: 2021-05-18 | Disposition: E | Payer: BC Managed Care – PPO | Attending: Emergency Medicine | Admitting: Emergency Medicine

## 2021-04-25 DIAGNOSIS — I10 Essential (primary) hypertension: Secondary | ICD-10-CM | POA: Insufficient documentation

## 2021-04-25 DIAGNOSIS — F17210 Nicotine dependence, cigarettes, uncomplicated: Secondary | ICD-10-CM | POA: Diagnosis not present

## 2021-04-25 DIAGNOSIS — I469 Cardiac arrest, cause unspecified: Secondary | ICD-10-CM | POA: Diagnosis not present

## 2021-04-25 MED ORDER — SODIUM BICARBONATE 8.4 % IV SOLN
INTRAVENOUS | Status: AC | PRN
  Administered 2021-04-25 (×2): 50 meq via INTRAVENOUS

## 2021-04-25 MED ORDER — EPINEPHRINE 1 MG/10ML IJ SOSY
PREFILLED_SYRINGE | INTRAMUSCULAR | Status: AC | PRN
  Administered 2021-04-25 (×2): 1 mg via INTRAVENOUS
  Administered 2021-04-25: 1 via INTRAVENOUS

## 2021-04-25 MED ORDER — CALCIUM CHLORIDE 10 % IV SOLN
INTRAVENOUS | Status: AC | PRN
  Administered 2021-04-25: 1 g via INTRAVENOUS

## 2021-04-26 ENCOUNTER — Ambulatory Visit: Payer: BC Managed Care – PPO

## 2021-04-27 ENCOUNTER — Ambulatory Visit: Payer: BC Managed Care – PPO | Admitting: Physical Medicine and Rehabilitation

## 2021-04-28 ENCOUNTER — Ambulatory Visit: Payer: BC Managed Care – PPO

## 2021-05-05 ENCOUNTER — Ambulatory Visit: Payer: BC Managed Care – PPO | Admitting: Podiatry

## 2021-05-18 ENCOUNTER — Ambulatory Visit: Payer: BC Managed Care – PPO | Admitting: Physical Medicine and Rehabilitation

## 2021-05-18 NOTE — Progress Notes (Signed)
   2021/05/22 0700  Clinical Encounter Type  Visited With Patient and family together  Visit Type Death;Psychological support;Social support  Referral From Nurse  Consult/Referral To Chaplain  Spiritual Encounters  Spiritual Needs Emotional;Grief support  The chaplain visited Trauma C to speak with the family of the deceased patient. The patient's son was at bedside. The chaplain spoke words of comfort to the son and was present with his nurse. The chaplain gave the son a patient placement card but no funeral home has been selected at this time. The chaplain later spoke with other family members who were present. The death is shocking and unexpected. The son expressed the pressure of school and his regrets. The chaplain will follow up as needed.
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
## 2021-05-18 NOTE — Telephone Encounter (Signed)
Wasn't able to r/s due to his decease.
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
## 2021-05-18 DEATH — deceased
